# Patient Record
Sex: Male | Born: 1940 | Race: White | Hispanic: No | Smoking: Never smoker
Health system: Southern US, Community
[De-identification: ages and names within clinical notes are randomized; demographics above are authoritative.]

## PROBLEM LIST (undated history)

## (undated) DIAGNOSIS — E119 Type 2 diabetes mellitus without complications: Secondary | ICD-10-CM

## (undated) DIAGNOSIS — I10 Essential (primary) hypertension: Secondary | ICD-10-CM

## (undated) DIAGNOSIS — I429 Cardiomyopathy, unspecified: Secondary | ICD-10-CM

## (undated) DIAGNOSIS — E785 Hyperlipidemia, unspecified: Secondary | ICD-10-CM

## (undated) DIAGNOSIS — M109 Gout, unspecified: Secondary | ICD-10-CM

## (undated) DIAGNOSIS — F319 Bipolar disorder, unspecified: Secondary | ICD-10-CM

## (undated) DIAGNOSIS — N400 Enlarged prostate without lower urinary tract symptoms: Secondary | ICD-10-CM

## (undated) DIAGNOSIS — Z950 Presence of cardiac pacemaker: Secondary | ICD-10-CM

---

## 2017-02-28 ENCOUNTER — Inpatient Hospital Stay
Admission: EM | Admit: 2017-02-28 | Discharge: 2017-03-17 | DRG: 853 | Disposition: A | Discharge status: Skilled Nursing Facility | Payer: Medicare Other | Attending: Internal Medicine | Admitting: Internal Medicine

## 2017-02-28 ENCOUNTER — Encounter: Payer: Self-pay | Admitting: Emergency Medicine

## 2017-02-28 ENCOUNTER — Emergency Department: Payer: Medicare Other

## 2017-02-28 DIAGNOSIS — Z950 Presence of cardiac pacemaker: Secondary | ICD-10-CM

## 2017-02-28 DIAGNOSIS — E8809 Other disorders of plasma-protein metabolism, not elsewhere classified: Secondary | ICD-10-CM | POA: Diagnosis present

## 2017-02-28 DIAGNOSIS — N179 Acute kidney failure, unspecified: Secondary | ICD-10-CM | POA: Diagnosis present

## 2017-02-28 DIAGNOSIS — E870 Hyperosmolality and hypernatremia: Secondary | ICD-10-CM | POA: Diagnosis present

## 2017-02-28 DIAGNOSIS — R6521 Severe sepsis with septic shock: Secondary | ICD-10-CM | POA: Diagnosis not present

## 2017-02-28 DIAGNOSIS — Z79899 Other long term (current) drug therapy: Secondary | ICD-10-CM | POA: Diagnosis not present

## 2017-02-28 DIAGNOSIS — Z0189 Encounter for other specified special examinations: Secondary | ICD-10-CM

## 2017-02-28 DIAGNOSIS — I110 Hypertensive heart disease with heart failure: Secondary | ICD-10-CM | POA: Diagnosis present

## 2017-02-28 DIAGNOSIS — F319 Bipolar disorder, unspecified: Secondary | ICD-10-CM | POA: Diagnosis present

## 2017-02-28 DIAGNOSIS — R109 Unspecified abdominal pain: Secondary | ICD-10-CM

## 2017-02-28 DIAGNOSIS — E871 Hypo-osmolality and hyponatremia: Secondary | ICD-10-CM | POA: Diagnosis present

## 2017-02-28 DIAGNOSIS — J189 Pneumonia, unspecified organism: Secondary | ICD-10-CM

## 2017-02-28 DIAGNOSIS — J690 Pneumonitis due to inhalation of food and vomit: Secondary | ICD-10-CM | POA: Diagnosis present

## 2017-02-28 DIAGNOSIS — E785 Hyperlipidemia, unspecified: Secondary | ICD-10-CM | POA: Diagnosis present

## 2017-02-28 DIAGNOSIS — Z66 Do not resuscitate: Secondary | ICD-10-CM | POA: Diagnosis present

## 2017-02-28 DIAGNOSIS — K567 Ileus, unspecified: Secondary | ICD-10-CM | POA: Diagnosis present

## 2017-02-28 DIAGNOSIS — I248 Other forms of acute ischemic heart disease: Secondary | ICD-10-CM | POA: Diagnosis present

## 2017-02-28 DIAGNOSIS — I429 Cardiomyopathy, unspecified: Secondary | ICD-10-CM | POA: Diagnosis present

## 2017-02-28 DIAGNOSIS — A419 Sepsis, unspecified organism: Principal | ICD-10-CM | POA: Diagnosis present

## 2017-02-28 DIAGNOSIS — Z9911 Dependence on respirator [ventilator] status: Secondary | ICD-10-CM

## 2017-02-28 DIAGNOSIS — I252 Old myocardial infarction: Secondary | ICD-10-CM

## 2017-02-28 DIAGNOSIS — N5089 Other specified disorders of the male genital organs: Secondary | ICD-10-CM | POA: Diagnosis present

## 2017-02-28 DIAGNOSIS — R410 Disorientation, unspecified: Secondary | ICD-10-CM

## 2017-02-28 DIAGNOSIS — R0600 Dyspnea, unspecified: Secondary | ICD-10-CM

## 2017-02-28 DIAGNOSIS — I5042 Chronic combined systolic (congestive) and diastolic (congestive) heart failure: Secondary | ICD-10-CM | POA: Diagnosis present

## 2017-02-28 DIAGNOSIS — I420 Dilated cardiomyopathy: Secondary | ICD-10-CM | POA: Diagnosis not present

## 2017-02-28 DIAGNOSIS — E872 Acidosis, unspecified: Secondary | ICD-10-CM

## 2017-02-28 DIAGNOSIS — K55069 Acute infarction of intestine, part and extent unspecified: Secondary | ICD-10-CM | POA: Diagnosis present

## 2017-02-28 DIAGNOSIS — R140 Abdominal distension (gaseous): Secondary | ICD-10-CM | POA: Diagnosis not present

## 2017-02-28 DIAGNOSIS — E876 Hypokalemia: Secondary | ICD-10-CM | POA: Diagnosis present

## 2017-02-28 DIAGNOSIS — R748 Abnormal levels of other serum enzymes: Secondary | ICD-10-CM | POA: Diagnosis not present

## 2017-02-28 DIAGNOSIS — R404 Transient alteration of awareness: Secondary | ICD-10-CM

## 2017-02-28 DIAGNOSIS — Z4659 Encounter for fitting and adjustment of other gastrointestinal appliance and device: Secondary | ICD-10-CM

## 2017-02-28 DIAGNOSIS — J181 Lobar pneumonia, unspecified organism: Secondary | ICD-10-CM

## 2017-02-28 DIAGNOSIS — F05 Delirium due to known physiological condition: Secondary | ICD-10-CM | POA: Diagnosis not present

## 2017-02-28 DIAGNOSIS — E874 Mixed disorder of acid-base balance: Secondary | ICD-10-CM | POA: Diagnosis present

## 2017-02-28 DIAGNOSIS — R188 Other ascites: Secondary | ICD-10-CM | POA: Diagnosis present

## 2017-02-28 DIAGNOSIS — I2510 Atherosclerotic heart disease of native coronary artery without angina pectoris: Secondary | ICD-10-CM | POA: Diagnosis present

## 2017-02-28 DIAGNOSIS — Z452 Encounter for adjustment and management of vascular access device: Secondary | ICD-10-CM

## 2017-02-28 DIAGNOSIS — J9601 Acute respiratory failure with hypoxia: Secondary | ICD-10-CM | POA: Diagnosis present

## 2017-02-28 DIAGNOSIS — Z978 Presence of other specified devices: Secondary | ICD-10-CM

## 2017-02-28 DIAGNOSIS — M109 Gout, unspecified: Secondary | ICD-10-CM | POA: Diagnosis present

## 2017-02-28 DIAGNOSIS — T85598A Other mechanical complication of other gastrointestinal prosthetic devices, implants and grafts, initial encounter: Secondary | ICD-10-CM

## 2017-02-28 DIAGNOSIS — J9600 Acute respiratory failure, unspecified whether with hypoxia or hypercapnia: Secondary | ICD-10-CM

## 2017-02-28 HISTORY — DX: Benign prostatic hyperplasia without lower urinary tract symptoms: N40.0

## 2017-02-28 HISTORY — DX: Hyperlipidemia, unspecified: E78.5

## 2017-02-28 HISTORY — DX: Presence of cardiac pacemaker: Z95.0

## 2017-02-28 HISTORY — DX: Bipolar disorder, unspecified: F31.9

## 2017-02-28 HISTORY — DX: Cardiomyopathy, unspecified: I42.9

## 2017-02-28 HISTORY — DX: Gout, unspecified: M10.9

## 2017-02-28 HISTORY — DX: Essential (primary) hypertension: I10

## 2017-02-28 LAB — COMPREHENSIVE METABOLIC PANEL
ALT: 34 U/L (ref 17–63)
AST: 65 U/L — AB (ref 15–41)
Albumin: 3 g/dL — ABNORMAL LOW (ref 3.5–5.0)
Alkaline Phosphatase: 65 U/L (ref 38–126)
Anion gap: 20 — ABNORMAL HIGH (ref 5–15)
BUN: 65 mg/dL — AB (ref 6–20)
CO2: 18 mmol/L — AB (ref 22–32)
Calcium: 8.8 mg/dL — ABNORMAL LOW (ref 8.9–10.3)
Chloride: 93 mmol/L — ABNORMAL LOW (ref 101–111)
Creatinine, Ser: 4.75 mg/dL — ABNORMAL HIGH (ref 0.61–1.24)
GFR, EST AFRICAN AMERICAN: 13 mL/min — AB (ref >60)
GFR, EST NON AFRICAN AMERICAN: 11 mL/min — AB (ref >60)
Glucose, Bld: 165 mg/dL — ABNORMAL HIGH (ref 65–99)
Potassium: 5.1 mmol/L (ref 3.5–5.1)
SODIUM: 131 mmol/L — AB (ref 135–145)
Total Bilirubin: 0.9 mg/dL (ref 0.3–1.2)
Total Protein: 6.3 g/dL — ABNORMAL LOW (ref 6.5–8.1)

## 2017-02-28 LAB — URINALYSIS, COMPLETE (UACMP) WITH MICROSCOPIC
Bacteria, UA: NONE SEEN
Bilirubin Urine: NEGATIVE
Glucose, UA: 50 mg/dL — AB
KETONES UR: 5 mg/dL — AB
Leukocytes, UA: NEGATIVE
Nitrite: NEGATIVE
PH: 5 (ref 5.0–8.0)
Protein, ur: 30 mg/dL — AB
SPECIFIC GRAVITY, URINE: 1.025 (ref 1.005–1.030)
SQUAMOUS EPITHELIAL / LPF: NONE SEEN

## 2017-02-28 LAB — BLOOD GAS, VENOUS
ACID-BASE DEFICIT: 7.1 mmol/L — AB (ref 0.0–2.0)
BICARBONATE: 16.9 mmol/L — AB (ref 20.0–28.0)
O2 SAT: 81.6 %
PCO2 VEN: 30 mmHg — AB (ref 44.0–60.0)
PO2 VEN: 48 mmHg — AB (ref 32.0–45.0)
Patient temperature: 37
pH, Ven: 7.36 (ref 7.250–7.430)

## 2017-02-28 LAB — BRAIN NATRIURETIC PEPTIDE: B Natriuretic Peptide: 101 pg/mL — ABNORMAL HIGH (ref 0.0–100.0)

## 2017-02-28 LAB — CBC WITH DIFFERENTIAL/PLATELET
BASOS ABS: 0 10*3/uL (ref 0–0.1)
Basophils Relative: 0 %
Eosinophils Absolute: 0 10*3/uL (ref 0–0.7)
Eosinophils Relative: 0 %
HCT: 45.1 % (ref 40.0–52.0)
Hemoglobin: 15.2 g/dL (ref 13.0–18.0)
LYMPHS ABS: 1.2 10*3/uL (ref 1.0–3.6)
Lymphocytes Relative: 5 %
MCH: 32.9 pg (ref 26.0–34.0)
MCHC: 33.7 g/dL (ref 32.0–36.0)
MCV: 97.8 fL (ref 80.0–100.0)
MONO ABS: 3.3 10*3/uL — AB (ref 0.2–1.0)
Monocytes Relative: 14 %
NEUTROS PCT: 81 %
Neutro Abs: 19.2 10*3/uL — ABNORMAL HIGH (ref 1.4–6.5)
PLATELETS: 168 10*3/uL (ref 150–440)
RBC: 4.61 MIL/uL (ref 4.40–5.90)
RDW: 13.8 % (ref 11.5–14.5)
WBC: 23.7 10*3/uL — AB (ref 3.8–10.6)

## 2017-02-28 LAB — TROPONIN I: TROPONIN I: 0.08 ng/mL — AB (ref <0.03)

## 2017-02-28 LAB — PROCALCITONIN: PROCALCITONIN: 2.88 ng/mL

## 2017-02-28 LAB — GLUCOSE, CAPILLARY: Glucose-Capillary: 165 mg/dL — ABNORMAL HIGH (ref 65–99)

## 2017-02-28 LAB — PROTIME-INR
INR: 1.33
PROTHROMBIN TIME: 16.6 s — AB (ref 11.4–15.2)

## 2017-02-28 LAB — LIPASE, BLOOD: LIPASE: 159 U/L — AB (ref 11–51)

## 2017-02-28 LAB — LACTIC ACID, PLASMA: Lactic Acid, Venous: 7.3 mmol/L (ref 0.5–1.9)

## 2017-02-28 MED ORDER — SODIUM CHLORIDE 0.9 % IV BOLUS (SEPSIS)
500.0000 mL | INTRAVENOUS | Status: AC
  Administered 2017-03-01: 1000 mL via INTRAVENOUS

## 2017-02-28 MED ORDER — SODIUM CHLORIDE 0.9 % IV BOLUS (SEPSIS)
1000.0000 mL | Freq: Once | INTRAVENOUS | Status: AC
  Administered 2017-02-28: 1000 mL via INTRAVENOUS

## 2017-02-28 MED ORDER — DEXTROSE 5 % IV SOLN
2.0000 g | Freq: Once | INTRAVENOUS | Status: AC
  Administered 2017-02-28: 2 g via INTRAVENOUS
  Filled 2017-02-28: qty 2

## 2017-02-28 MED ORDER — SODIUM CHLORIDE 0.9 % IV BOLUS (SEPSIS): 1000.0000 mL | Freq: Once | INTRAVENOUS | Status: DC

## 2017-02-28 MED ORDER — VANCOMYCIN HCL IN DEXTROSE 1-5 GM/200ML-% IV SOLN
1000.0000 mg | Freq: Once | INTRAVENOUS | Status: AC
  Administered 2017-02-28: 1000 mg via INTRAVENOUS
  Filled 2017-02-28: qty 200

## 2017-02-28 MED ORDER — SODIUM CHLORIDE 0.9 % IV BOLUS (SEPSIS)
500.0000 mL | INTRAVENOUS | Status: AC
Start: 2017-02-28 — End: 2017-02-28
  Administered 2017-02-28: 500 mL via INTRAVENOUS

## 2017-02-28 NOTE — ED Notes (Signed)
7.3 lactic acid per lab, MD York CeriseForbach made aware at this time

## 2017-02-28 NOTE — ED Notes (Signed)
Pt placed on 4L [place] , sats at 93%

## 2017-02-28 NOTE — H&P (Addendum)
History and Physical   [facility] [place] [facility] Admission History and Physical [employer] Blessyn [place], D.O.    Patient Name: Alvin PeckJohn [name] MR#: [id number] Date of Birth: 05/19/1941 Date of Admission: 02/28/2017  Referring MD/NP/PA: Dr. York CeriseForbach Primary Care Physician: System, Pcp Not In  Chief Complaint:  Chief Complaint  Patient presents with  . Shortness of Breath    HPI: Alvin [name] is a 76 y.o. male [facility] with a known history of Bipolar disorder, hypertension presents to the emergency department for evaluation of respiratory distress.  Patient was in a usual state of health until 3-4 days ago when he describes the onset of abdominal pain, midepigastric and right lower quadrant associated with nausea, vomiting and diarrhea. He developed gradually worsening weakness secondary decreased by mouth intake. Today he became confused and acutely short of breath.  Patient denies fevers/chills, weakness, dizziness, chest pain,  dysuria/frequency, changes in mental status. Last bowel movement was yesterday.   Patient lives in an assisted living facility and is independent with his activities of daily living. Otherwise there has been no change in status. Patient has been taking medication as prescribed and there has been no recent change in medication or diet.  No recent antibiotics.  There has been no recent illness, hospitalizations, travel or sick contacts.    EMS/ED Course: Patient received normal saline boluses, Maxipime and vancomycin with some improvement in his mental status. Initial O2 sat was 85% on room air which increased to 93% on 4 L. We did attempt BiPAP however nausea and vomiting prohibited its use. Medical admission has been requested for further management of severe sepsis with septic shock..  Review of Systems:  CONSTITUTIONAL: Positive weakness. No fever/chills, fatigue weight gain/loss, headache. EYES: No blurry or double vision. ENT: No tinnitus, postnasal drip,  redness or soreness of the oropharynx. RESPIRATORY: No cough, dyspnea, wheeze.  No hemoptysis.  CARDIOVASCULAR: No chest pain, palpitations, syncope, orthopnea. No lower extremity edema.  GASTROINTESTINAL: Positive nausea, vomiting, abdominal pain, diarrhea. No hematemesis, melena or hematochezia. GENITOURINARY: No dysuria, frequency, hematuria. ENDOCRINE: No polyuria or nocturia. No heat or cold intolerance. HEMATOLOGY: No anemia, bruising, bleeding. INTEGUMENTARY: No rashes, ulcers, lesions. MUSCULOSKELETAL: No arthritis, gout, dyspnea. NEUROLOGIC: No numbness, tingling, ataxia, seizure-type activity, weakness. PSYCHIATRIC: No anxiety, depression, insomnia.   Past medical history:  Bipolar disorder and hypertension  No past surgical history on file.   has no tobacco, alcohol, and drug history on file.  No Known Allergies  No family history on file.  Prior to Admission medications   Medication Sig Start Date End Date Taking? Authorizing Provider  allopurinol (ZYLOPRIM) 300 MG tablet Take 300 mg by mouth daily.   Yes [provider]  atorvastatin (LIPITOR) 20 MG tablet Take 20 mg by mouth daily.   Yes [provider]  cholecalciferol (VITAMIN D) 1000 units tablet Take 1,000 Units by mouth daily.   Yes [provider]  divalproex (DEPAKOTE ER) 250 MG 24 hr tablet Take 250 mg by mouth daily.   Yes [provider]  divalproex (DEPAKOTE ER) 500 MG 24 hr tablet Take 500 mg by mouth daily.   Yes [provider]  lanolin/mineral oil (KERI/THERA-DERM) LOTN Apply 1 application topically as needed for dry skin.   Yes [provider]  lisinopril (PRINIVIL,ZESTRIL) 20 MG tablet Take 20 mg by mouth daily.   Yes [provider]  MELATONIN PO Take 3 mg by mouth at bedtime.   Yes [provider]  menthol-cetylpyridinium (CEPACOL)  3 MG lozenge Take 1 lozenge by mouth as needed for sore throat.   Yes [provider]   Multiple Vitamins-Minerals (MULTIVITAMINS THER. W/MINERALS) TABS tablet Take 1 tablet by mouth daily.   Yes [provider]  OLANZapine (ZYPREXA) 15 MG tablet Take 15 mg by mouth at bedtime.   Yes [provider]  OLANZapine (ZYPREXA) 2.5 MG tablet Take 2.5 mg by mouth every 8 (eight) hours as needed.   Yes [provider]  tamsulosin (FLOMAX) 0.4 MG CAPS capsule Take 0.4 mg by mouth daily.   Yes [provider]  thiamine (VITAMIN B-1) 100 MG tablet Take 100 mg by mouth daily.   Yes [provider]  traZODone (DESYREL) 50 MG tablet Take 50 mg by mouth at bedtime.   Yes [provider]  vitamin B-12 (CYANOCOBALAMIN) 1000 MCG tablet Take 1,000 mcg by mouth daily.   Yes [provider]    Physical Exam: Vitals:   02/28/17 2230 02/28/17 2233 02/28/17 2238 02/28/17 2242  BP:  (!) 77/45 (!) 79/59 (!) 81/53  Pulse: (!) 110 (!) 116 (!) 116 (!) 110  Resp: (!) 30 (!) 29 (!) 33 (!) 29  Temp:      TempSrc:      SpO2: 91% 91% (!) 88% (!) 89%  Weight:        GENERAL: 76 y.o.-year-old White male patient, ill-appearing sitting up in the bed in no acute distress. Answers questions, speaking in full sentences. HEENT: Head atraumatic, normocephalic. Pupils equal. Mucus membranes moist. NECK: Supple, full range of motion. No JVD, no bruit heard. No thyroid enlargement, no tenderness, no cervical lymphadenopathy. CHEST: Diffuse rhonchi, worse at the bases. No use of accessory muscles of respiration.  No reproducible chest wall tenderness.  CARDIOVASCULAR: S1, S2 normal. No murmurs, rubs, or gallops. Cap refill <2 seconds. Pulses intact distally.  ABDOMEN: Distended, tympanic, high-pitched bowel sounds. Mild tenderness palpation at the midepigastrium and right lower quadrant. No rebound, guarding, rigidity. EXTREMITIES: No pedal edema, cyanosis, or clubbing. No calf tenderness or Homan's sign.  NEUROLOGIC: The patient is alert and oriented x 3.  Cranial nerves II through XII are grossly intact with no focal sensorimotor deficit. PSYCHIATRIC:  Normal affect, mood, thought content. SKIN: Warm, dry, and intact without obvious rash, lesion, or ulcer.    Labs on Admission:  CBC:  Recent Labs Lab 02/28/17 2158  WBC 23.7*  NEUTROABS PENDING  HGB 15.2  HCT 45.1  MCV 97.8  PLT 168   Basic Metabolic Panel:  Recent Labs Lab 02/28/17 2158  NA 131*  K 5.1  CL 93*  CO2 18*  GLUCOSE 165*  BUN 65*  CREATININE 4.75*  CALCIUM 8.8*   GFR: CrCl cannot be calculated (Unknown ideal weight.). Liver Function Tests:  Recent Labs Lab 02/28/17 2158  AST 65*  ALT 34  ALKPHOS 65  BILITOT 0.9  PROT 6.3*  ALBUMIN 3.0*    Recent Labs Lab 02/28/17 2158  LIPASE 159*   No results for input(s): AMMONIA in the last 168 hours. Coagulation Profile:  Recent Labs Lab 02/28/17 2158  INR 1.33   Cardiac Enzymes:  Recent Labs Lab 02/28/17 2158  TROPONINI 0.08*   BNP (last 3 results) No results for input(s): PROBNP in the last 8760 hours. HbA1C: No results for input(s): HGBA1C in the last 72 hours. CBG:  Recent Labs Lab 02/28/17 2201  GLUCAP 165*   Lipid Profile: No results for input(s): CHOL, HDL, LDLCALC, TRIG, CHOLHDL, LDLDIRECT in the last  72 hours. Thyroid Function Tests: No results for input(s): TSH, T4TOTAL, FREET4, T3FREE, THYROIDAB in the last 72 hours. Anemia Panel: No results for input(s): VITAMINB12, FOLATE, FERRITIN, TIBC, IRON, RETICCTPCT in the last 72 hours. Urine analysis:    Component Value Date/Time   COLORURINE AMBER (A) 02/28/2017 2158   APPEARANCEUR HAZY (A) 02/28/2017 2158   LABSPEC 1.025 02/28/2017 2158   PHURINE 5.0 02/28/2017 2158   GLUCOSEU 50 (A) 02/28/2017 2158   HGBUR SMALL (A) 02/28/2017 2158   BILIRUBINUR NEGATIVE 02/28/2017 2158   KETONESUR 5 (A) 02/28/2017 2158   PROTEINUR 30 (A) 02/28/2017 2158   NITRITE NEGATIVE 02/28/2017 2158   LEUKOCYTESUR NEGATIVE 02/28/2017 2158    Sepsis Labs: @LABRCNTIP (procalcitonin:4,lacticidven:4) )No results found for this or any previous visit (from the past 240 hour(s)).   Radiological Exams on Admission: Dg Chest Port 1 View  Result Date: 02/28/2017 CLINICAL DATA:  Vomiting for several days EXAM: PORTABLE CHEST 1 VIEW COMPARISON:  None. FINDINGS: Cardiac shadow is mildly enlarged. Pacing device is noted. Mild left basilar opacity and small effusion are seen. The right lung is clear. The bony structures are within normal limits. IMPRESSION: Left basilar opacity with associated small effusion. Electronically Signed   By: Alcide Clever M.D.   On: 02/28/2017 22:25    EKG: Atrial paced at 110 bpm with normal axis and nonspecific ST-T wave changes.   Assessment/Plan  This is a 76 y.o. male with a history of bipolar disorder, hypertension now being admitted with:  #. Severe sepsis with septic shock secondary to CAP, possibly aspiration pneumonia - Admit to inpatient, ICU with telemetry monitoring - IV antibiotics: Maxipime, Vanco - IV fluid hydration - Trend lactate - Pressor support if needed - Follow up blood,urine & sputum cultures - Repeat CBC in am.   #. Acute respiratory failure with hypoxia - O2, nebs, expectorants  #. Abdominal distention rule out bowel obstruction - NPO -Stat CT abdomen and pelvis without contrast due to elevated creatinine. - NG tube placed in the emergency department draining 300-400 cc of bilious material - Consider surgical consultation  #. Renal insufficiency, unclear if acute or chronic - IV fluids and repeat BMP in AM.  - Avoid nephrotoxic medications  #. Elevated troponin, likely secondary to demand ischemia - Trend troponins - Monitor on telemetry  #. Elevated lipase - Follow up abdominal CT Continue Lipitor  #. History of BPH - Continue Flomax  #. History of Bipolar disorder - Continue Depakote, Zyprexa, Trazodone  Admission status: Inpatient, ICU IV Fluids:  NS Diet/Nutrition: NPO Consults called: CCM  DVT Px: Lovenox, SCDs and early ambulation. Code Status: Full Code  Disposition Plan: To be determined.  [facility] patient will need to be transferred when stable.  please see Dr. Scharlene Corn note.   All the records are reviewed and case discussed with ED provider. Management plans discussed with the patient and/or family who express understanding and agree with plan of care.  Tia Gelb D.O. on 02/28/2017 at 11:18 PM Between 7am to 6pm - Pager - [phone] After 6pm go to [email or web address] - password [email or web address] [place] [facility] Office [phone] CC: Primary care physician; System, Pcp Not In   02/28/2017, 11:18 PM

## 2017-02-28 NOTE — ED Notes (Signed)
Per EDP York CeriseForbach give 1000 ML of fluid due to BP

## 2017-02-28 NOTE — ED Provider Notes (Signed)
[facility] Emergency Department Provider Note  ____________________________________________   First MD Initiated Contact with Patient 02/28/17 2200     (approximate)  I have reviewed the triage vital signs and the nursing notes.   HISTORY  Chief Complaint Shortness of Breath  Level 5 caveat:  history/ROS limited by acute/critical illness  HPI Alvin [name] is a 76 y.o. male who presents by EMS for several days of vomiting and now with shortness of breath.  EMS reports that he had an oxygen level of about 85% on room air with increased work of breathing and coarse breath sounds.  He is on oxygen upon arrival at 4 L she has had about 93%.  He is awake and alert but not able to provide much history and does appear acutely ill.  His daughter came in afterwards and was able to provide more history.  She confirms that he lives in a facility but requires very little assistance with his activities of daily living. He started vomiting several days ago and has continued to do so and she has seen him gradually decline in his overall status over the last few days.  As of today he is acutely ill, less responsive, and obviously having trouble breathing.    He denies any chest pain, admits to shortness of breath, and denies abdominal pain.  He also admits to the vomiting and diarrhea.  He denies dysuria.  The symptoms began gradually getting worse over the last several days, are now severe, and nothing makes them better or worse.  He typically goes to the [place] and has full benefits.  Past Medical History:  Diagnosis Date  . Bipolar disorder (HCC)   . Hypertension     Patient Active Problem List   Diagnosis Date Noted  . Severe sepsis with septic shock (HCC) 02/28/2017    History reviewed. No pertinent surgical history.  Prior to Admission medications   Medication Sig Start Date End Date Taking? Authorizing Provider  allopurinol (ZYLOPRIM) 300 MG tablet Take 300 mg by  mouth daily.   Yes [provider]  atorvastatin (LIPITOR) 20 MG tablet Take 20 mg by mouth daily.   Yes [provider]  cholecalciferol (VITAMIN D) 1000 units tablet Take 1,000 Units by mouth daily.   Yes [provider]  divalproex (DEPAKOTE ER) 250 MG 24 hr tablet Take 250 mg by mouth daily.   Yes [provider]  divalproex (DEPAKOTE ER) 500 MG 24 hr tablet Take 500 mg by mouth daily.   Yes [provider]  lanolin/mineral oil (KERI/THERA-DERM) LOTN Apply 1 application topically as needed for dry skin.   Yes [provider]  lisinopril (PRINIVIL,ZESTRIL) 20 MG tablet Take 20 mg by mouth daily.   Yes [provider]  MELATONIN PO Take 3 mg by mouth at bedtime.   Yes [provider]  menthol-cetylpyridinium (CEPACOL) 3 MG lozenge Take 1 lozenge by mouth as needed for sore throat.   Yes [provider]  Multiple Vitamins-Minerals (MULTIVITAMINS THER. W/MINERALS) TABS tablet Take 1 tablet by mouth daily.   Yes [provider]  OLANZapine (ZYPREXA) 15 MG tablet Take 15 mg by mouth at bedtime.   Yes [provider]  OLANZapine (ZYPREXA) 2.5 MG tablet Take 2.5 mg by mouth every 8 (eight) hours as needed.   Yes [provider]  tamsulosin (FLOMAX) 0.4 MG CAPS capsule Take 0.4 mg by mouth daily.   Yes [provider]  thiamine (VITAMIN B-1) 100  MG tablet Take 100 mg by mouth daily.   Yes [provider]  traZODone (DESYREL) 50 MG tablet Take 50 mg by mouth at bedtime.   Yes [provider]  vitamin B-12 (CYANOCOBALAMIN) 1000 MCG tablet Take 1,000 mcg by mouth daily.   Yes [provider]    Allergies Patient has no known allergies.  History reviewed. No pertinent family history.  Social History Social History  Substance Use Topics  . Smoking status: Never Smoker  . Smokeless tobacco: Never Used  . Alcohol use No    Review of Systems  Level 5  caveat:  history/ROS limited by acute/critical illness  ____________________________________________   PHYSICAL EXAM:  VITAL SIGNS: ED Triage Vitals  Enc Vitals Group     BP 02/28/17 2233 (!) 77/45     Pulse Rate 02/28/17 2158 96     Resp 02/28/17 2201 (!) 33     Temp 02/28/17 2204 98 F (36.7 C)     Temp Source 02/28/17 2204 Oral     SpO2 02/28/17 2158 (!) 87 %     Weight 02/28/17 2203 86.2 kg (190 lb)     Height --      Head Circumference --      Peak Flow --      Pain Score --      Pain Loc --      Pain Edu? --      Excl. in GC? --     Constitutional: Ill-appearing but alert and responsive to questions although he is clearly suffering from an element of delirium/confusion Eyes: Conjunctivae are normal.  Head: Atraumatic. Nose: No congestion/rhinnorhea. Mouth/Throat: Mucous membranes are dry Neck: No stridor.  No meningeal signs.   Cardiovascular: Borderline tachycardia, regular rhythm. Good peripheral circulation. Grossly normal heart sounds. Respiratory: Increased respiratory effort.  No retractions. Coarse breath sounds throughout, worse in bases. Gastrointestinal: Soft and nontender. Distended but apparently at baseline. Musculoskeletal: No lower extremity tenderness nor edema. No gross deformities of extremities. Neurologic:  Moving all four extremities Skin:  Skin is pale, warm, dry and intact. No rash noted.  ____________________________________________   LABS (all labs ordered are listed, but only abnormal results are displayed)  Labs Reviewed  LACTIC ACID, PLASMA - Abnormal; Notable for the following:       Result Value   Lactic Acid, Venous 7.3 (*)    All other components within normal limits  COMPREHENSIVE METABOLIC PANEL - Abnormal; Notable for the following:    Sodium 131 (*)    Chloride 93 (*)    CO2 18 (*)    Glucose, Bld 165 (*)    BUN 65 (*)    Creatinine, Ser 4.75 (*)    Calcium 8.8 (*)    Total Protein 6.3 (*)    Albumin 3.0 (*)    AST  65 (*)    GFR calc non Af Amer 11 (*)    GFR calc Af Amer 13 (*)    Anion gap 20 (*)    All other components within normal limits  LIPASE, BLOOD - Abnormal; Notable for the following:    Lipase 159 (*)    All other components within normal limits  BRAIN NATRIURETIC PEPTIDE - Abnormal; Notable for the following:    B Natriuretic Peptide 101.0 (*)    All other components within normal limits  TROPONIN I - Abnormal; Notable for the following:    Troponin I 0.08 (*)    All other components within normal limits  CBC WITH DIFFERENTIAL/PLATELET - Abnormal; Notable for the following:    WBC 23.7 (*)    Neutro Abs 19.2 (*)    Monocytes Absolute 3.3 (*)    All other components within normal limits  PROTIME-INR - Abnormal; Notable for the following:    Prothrombin Time 16.6 (*)    All other components within normal limits  URINALYSIS, COMPLETE (UACMP) WITH MICROSCOPIC - Abnormal; Notable for the following:    Color, Urine AMBER (*)    APPearance HAZY (*)    Glucose, UA 50 (*)    Hgb urine dipstick SMALL (*)    Ketones, ur 5 (*)    Protein, ur 30 (*)    All other components within normal limits  BLOOD GAS, VENOUS - Abnormal; Notable for the following:    pCO2, Ven 30 (*)    pO2, Ven 48.0 (*)    Bicarbonate 16.9 (*)    Acid-base deficit 7.1 (*)    All other components within normal limits  GLUCOSE, CAPILLARY - Abnormal; Notable for the following:    Glucose-Capillary 165 (*)    All other components within normal limits  CULTURE, BLOOD (ROUTINE X 2)  CULTURE, BLOOD (ROUTINE X 2)  URINE CULTURE  PROCALCITONIN  LACTIC ACID, PLASMA   ____________________________________________  EKG  ED ECG REPORT I, Bracen Schum, the attending physician, personally viewed and interpreted this ECG.  Date: 02/28/2017 EKG Time: 22:19 Rate: 110 Rhythm: atrial-sensed ventricular paced rhythm with tachycardia QRS Axis: paced Intervals: paced ST/T Wave abnormalities: Non-specific ST segment /  T-wave changes, but no evidence of acute ischemia.   ____________________________________________  RADIOLOGY   Dg Chest Port 1 View  Result Date: 02/28/2017 CLINICAL DATA:  Vomiting for several days EXAM: PORTABLE CHEST 1 VIEW COMPARISON:  None. FINDINGS: Cardiac shadow is mildly enlarged. Pacing device is noted. Mild left basilar opacity and small effusion are seen. The right lung is clear. The bony structures are within normal limits. IMPRESSION: Left basilar opacity with associated small effusion. Electronically Signed   By: Alcide Clever M.D.   On: 02/28/2017 22:25    ____________________________________________   PROCEDURES  Critical Care performed: Yes, see critical care procedure note(s)   Procedure(s) performed:   .Critical Care Performed by: Loleta Rose Authorized by: Loleta Rose   Critical care provider statement:    Critical care time (minutes):  60   Critical care time was exclusive of:  Separately billable procedures and treating other patients   Critical care was necessary to treat or prevent imminent or life-threatening deterioration of the following conditions:  Sepsis and shock   Critical care was time spent personally by me on the following activities:  Development of treatment plan with patient or surrogate, discussions with consultants, evaluation of patient's response to treatment, examination of patient, obtaining history from patient or surrogate, ordering and performing treatments and interventions, ordering and review of laboratory studies, ordering and review of radiographic studies, pulse oximetry, re-evaluation of patient's condition and review of old charts      ____________________________________________   INITIAL IMPRESSION / ASSESSMENT AND PLAN / ED COURSE  Pertinent labs & imaging results that were available during my care of the patient were reviewed by me and considered in my medical decision making (see chart for  details).     Clinical Course as of Mar 02 3  Tue Feb 28, 2017  2203 Patient has tachypnea, hypoxemia, several days of vomiting, appears ill and probably septic.  Ordering septic protocol and empiric cefepime/vanc (concern  for possible nephrotoxicity if the patient has decreased renal function and I give Zosyn).  Will reassess shortly.  Does not need Bipap at this time.  [CF]  2256 Severely elevated lactic acid, acute renal failure, elevated troponin, elevated lipase.  Severe sepsis with shock and multi-system organ failure.  Unstable for transport to [facility], will submit paperwork to this effect.  Updated patient and daughter about how critically ill he is at this point.  Does not require intubation currently.  [CF]  2310 My secretary Delaney Meigs(Tamara) spoke with a representative at the [facility] (last name Christella HartiganJacobs).  Delaney Meigsamara explained the patient is too critically ill and unstable for transfer at this time.  Ms. Christella HartiganJacobs advised us that it is NOT necessary to complete and fax over paperwork at this time.  When the patient IS stable for transfer, the inpatient team will need to fill out the [facility] paperwork. Again, the patient is unstable at this time but this should not exclude him from the [facility] benefits to which he is entitled.  [CF]  2311 Communicated with Dr. Emmit PomfretHugelmeyer (hospitalist) who will come and admit the patient at the next available opportunity.  [CF]  2314 Blood pressure has improved to about 103/55.  [CF]  2354 Patient is still mouth breathing but he had been maintaining oxygenation in the low 90s.  Now he is consistently at about 88% with a good waveform.  I have called respiratory therapy and asked her to put him on BiPAP to maintain oxygenation and decrease his work of breathing.  We are still awaiting a bed in the ICU.  Continuing towards the goal of 30 mL/kg of normal saline.  [CF]    Clinical Course User Index [CF] Loleta RoseForbach, Elzabeth Mcquerry, MD    ____________________________________________  FINAL CLINICAL  IMPRESSION(S) / ED DIAGNOSES  Final diagnoses:  Severe sepsis with septic shock (HCC)  Lactic acidosis  Acute respiratory failure with hypoxemia (HCC)  Elevated lipase  Demand ischemia (HCC)  Community acquired pneumonia of left lower lobe of lung (HCC)     MEDICATIONS GIVEN DURING THIS VISIT:  Medications  sodium chloride 0.9 % bolus 1,000 mL (1,000 mLs Intravenous New Bag/Given 02/28/17 2354)    And  sodium chloride 0.9 % bolus 1,000 mL (0 mLs Intravenous Stopped 02/28/17 2331)  ceFEPIme (MAXIPIME) 2 g in dextrose 5 % 50 mL IVPB (0 g Intravenous Stopped 02/28/17 2243)  vancomycin (VANCOCIN) IVPB 1000 mg/200 mL premix (0 mg Intravenous Stopped 02/28/17 2316)  sodium chloride 0.9 % bolus 500 mL (0 mLs Intravenous Stopped 02/28/17 2300)  sodium chloride 0.9 % bolus 500 mL (1,000 mLs Intravenous New Bag/Given 03/01/17 0000)     NEW OUTPATIENT MEDICATIONS STARTED DURING THIS VISIT:  New Prescriptions   No medications on file    Modified Medications   No medications on file    Discontinued Medications   No medications on file     Note:  This document was prepared using Dragon voice recognition software and may include unintentional dictation errors.   Loleta RoseForbach, Damante Spragg, MD 03/01/17 0005

## 2017-02-28 NOTE — ED Notes (Signed)
0.08 troponin , MD York CeriseForbach made aware

## 2017-02-28 NOTE — ED Triage Notes (Signed)
Pt to ED via EMS from the [facility], states vomiting xfew days, now  Hypoxic and tachy. Code sepsis called. EDP at bedside

## 2017-03-01 ENCOUNTER — Inpatient Hospital Stay: Payer: Medicare Other | Admitting: Anesthesiology

## 2017-03-01 ENCOUNTER — Inpatient Hospital Stay: Payer: Medicare Other

## 2017-03-01 ENCOUNTER — Encounter
Admission: EM | Disposition: A | Discharge status: Skilled Nursing Facility | Payer: Self-pay | Source: Home / Self Care | Attending: Internal Medicine

## 2017-03-01 ENCOUNTER — Encounter: Payer: Self-pay | Admitting: Anesthesiology

## 2017-03-01 DIAGNOSIS — J9601 Acute respiratory failure with hypoxia: Secondary | ICD-10-CM

## 2017-03-01 DIAGNOSIS — R140 Abdominal distension (gaseous): Secondary | ICD-10-CM

## 2017-03-01 DIAGNOSIS — E872 Acidosis, unspecified: Secondary | ICD-10-CM

## 2017-03-01 DIAGNOSIS — R748 Abnormal levels of other serum enzymes: Secondary | ICD-10-CM

## 2017-03-01 HISTORY — PX: LAPAROTOMY: SHX154

## 2017-03-01 LAB — GLUCOSE, CAPILLARY
GLUCOSE-CAPILLARY: 160 mg/dL — AB (ref 65–99)
GLUCOSE-CAPILLARY: 137 mg/dL — AB (ref 65–99)
Glucose-Capillary: 151 mg/dL — ABNORMAL HIGH (ref 65–99)
Glucose-Capillary: 143 mg/dL — ABNORMAL HIGH (ref 65–99)

## 2017-03-01 LAB — BLOOD GAS, ARTERIAL
Acid-base deficit: 9.3 mmol/L — ABNORMAL HIGH (ref 0.0–2.0)
BICARBONATE: 20 mmol/L (ref 20.0–28.0)
FIO2: 0.9
Mechanical Rate: 15
O2 Saturation: 87.5 %
PEEP/CPAP: 8 cmH2O
PH ART: 7.16 — AB (ref 7.350–7.450)
Patient temperature: 37
RATE: 15 resp/min
VT: 500 mL
pCO2 arterial: 56 mmHg — ABNORMAL HIGH (ref 32.0–48.0)
pO2, Arterial: 69 mmHg — ABNORMAL LOW (ref 83.0–108.0)

## 2017-03-01 LAB — COMPREHENSIVE METABOLIC PANEL
ALBUMIN: 2.7 g/dL — AB (ref 3.5–5.0)
ALT: 33 U/L (ref 17–63)
ANION GAP: 13 (ref 5–15)
AST: 56 U/L — AB (ref 15–41)
Alkaline Phosphatase: 58 U/L (ref 38–126)
BILIRUBIN TOTAL: 0.9 mg/dL (ref 0.3–1.2)
BUN: 65 mg/dL — AB (ref 6–20)
CHLORIDE: 98 mmol/L — AB (ref 101–111)
CO2: 24 mmol/L (ref 22–32)
Calcium: 7.9 mg/dL — ABNORMAL LOW (ref 8.9–10.3)
Creatinine, Ser: 3.9 mg/dL — ABNORMAL HIGH (ref 0.61–1.24)
GFR calc Af Amer: 16 mL/min — ABNORMAL LOW (ref >60)
GFR calc non Af Amer: 14 mL/min — ABNORMAL LOW (ref >60)
GLUCOSE: 170 mg/dL — AB (ref 65–99)
POTASSIUM: 5 mmol/L (ref 3.5–5.1)
Sodium: 135 mmol/L (ref 135–145)
TOTAL PROTEIN: 5.8 g/dL — AB (ref 6.5–8.1)

## 2017-03-01 LAB — CBC WITH DIFFERENTIAL/PLATELET
BASOS PCT: 0 %
Basophils Absolute: 0 10*3/uL (ref 0–0.1)
EOS ABS: 0 10*3/uL (ref 0–0.7)
EOS PCT: 0 %
HCT: 44.5 % (ref 40.0–52.0)
HEMOGLOBIN: 14.8 g/dL (ref 13.0–18.0)
LYMPHS PCT: 8 %
Lymphs Abs: 1.3 10*3/uL (ref 1.0–3.6)
MCH: 33 pg (ref 26.0–34.0)
MCHC: 33.3 g/dL (ref 32.0–36.0)
MCV: 99.2 fL (ref 80.0–100.0)
MONO ABS: 2.3 10*3/uL — AB (ref 0.2–1.0)
Monocytes Relative: 14 %
NEUTROS PCT: 78 %
Neutro Abs: 12.8 10*3/uL — ABNORMAL HIGH (ref 1.4–6.5)
PLATELETS: 152 10*3/uL (ref 150–440)
RBC: 4.48 MIL/uL (ref 4.40–5.90)
RDW: 14.1 % (ref 11.5–14.5)
WBC: 16.4 10*3/uL — AB (ref 3.8–10.6)

## 2017-03-01 LAB — BASIC METABOLIC PANEL
ANION GAP: 9 (ref 5–15)
ANION GAP: 11 (ref 5–15)
BUN: 62 mg/dL — AB (ref 6–20)
BUN: 62 mg/dL — ABNORMAL HIGH (ref 6–20)
CHLORIDE: 101 mmol/L (ref 101–111)
CHLORIDE: 105 mmol/L (ref 101–111)
CO2: 24 mmol/L (ref 22–32)
CO2: 18 mmol/L — AB (ref 22–32)
Calcium: 7.5 mg/dL — ABNORMAL LOW (ref 8.9–10.3)
Calcium: 7.1 mg/dL — ABNORMAL LOW (ref 8.9–10.3)
Creatinine, Ser: 3.4 mg/dL — ABNORMAL HIGH (ref 0.61–1.24)
Creatinine, Ser: 3.28 mg/dL — ABNORMAL HIGH (ref 0.61–1.24)
GFR calc Af Amer: 20 mL/min — ABNORMAL LOW (ref >60)
GFR calc non Af Amer: 17 mL/min — ABNORMAL LOW (ref >60)
GFR, EST AFRICAN AMERICAN: 19 mL/min — AB (ref >60)
GFR, EST NON AFRICAN AMERICAN: 16 mL/min — AB (ref >60)
GLUCOSE: 189 mg/dL — AB (ref 65–99)
Glucose, Bld: 164 mg/dL — ABNORMAL HIGH (ref 65–99)
POTASSIUM: 4.7 mmol/L (ref 3.5–5.1)
POTASSIUM: 4.8 mmol/L (ref 3.5–5.1)
SODIUM: 134 mmol/L — AB (ref 135–145)
Sodium: 134 mmol/L — ABNORMAL LOW (ref 135–145)

## 2017-03-01 LAB — LACTIC ACID, PLASMA
LACTIC ACID, VENOUS: 4.6 mmol/L — AB (ref 0.5–1.9)
LACTIC ACID, VENOUS: 3.4 mmol/L — AB (ref 0.5–1.9)
Lactic Acid, Venous: 3.4 mmol/L (ref 0.5–1.9)

## 2017-03-01 LAB — TROPONIN I
TROPONIN I: 0.09 ng/mL — AB (ref <0.03)
TROPONIN I: 0.09 ng/mL — AB (ref <0.03)
Troponin I: 0.06 ng/mL (ref <0.03)

## 2017-03-01 LAB — PROTIME-INR
INR: 1.28
PROTHROMBIN TIME: 16.1 s — AB (ref 11.4–15.2)

## 2017-03-01 LAB — PROCALCITONIN: Procalcitonin: 3.58 ng/mL

## 2017-03-01 LAB — MAGNESIUM: MAGNESIUM: 1.9 mg/dL (ref 1.7–2.4)

## 2017-03-01 LAB — MRSA PCR SCREENING: MRSA BY PCR: NEGATIVE

## 2017-03-01 SURGERY — LAPAROTOMY, EXPLORATORY
Anesthesia: General | Site: Abdomen | Wound class: Clean Contaminated

## 2017-03-01 SURGERY — LAPAROTOMY, EXPLORATORY
Anesthesia: General | Site: Abdomen

## 2017-03-01 MED ORDER — SODIUM CHLORIDE 0.9 % IR SOLN
Status: DC | PRN
  Administered 2017-03-01: 1000 mL

## 2017-03-01 MED ORDER — VITAMIN B-12 1000 MCG PO TABS: 1000.0000 ug | ORAL_TABLET | Freq: Every day | ORAL | Status: DC

## 2017-03-01 MED ORDER — ONDANSETRON HCL 4 MG PO TABS: 4.0000 mg | ORAL_TABLET | Freq: Four times a day (QID) | ORAL | Status: DC | PRN

## 2017-03-01 MED ORDER — FENTANYL BOLUS VIA INFUSION
25.0000 ug | INTRAVENOUS | Status: DC | PRN
  Administered 2017-03-04 – 2017-03-05 (×5): 25 ug via INTRAVENOUS
  Filled 2017-03-01: qty 25

## 2017-03-01 MED ORDER — EPINEPHRINE PF 1 MG/10ML IJ SOSY
PREFILLED_SYRINGE | INTRAMUSCULAR | Status: DC | PRN
  Administered 2017-03-01 (×2): 20 ug via INTRAVENOUS

## 2017-03-01 MED ORDER — LISINOPRIL 20 MG PO TABS: 20.0000 mg | ORAL_TABLET | Freq: Every day | ORAL | Status: DC

## 2017-03-01 MED ORDER — DEXTROSE 5 % IV SOLN: 2.0000 g | INTRAVENOUS | Status: DC

## 2017-03-01 MED ORDER — ONDANSETRON HCL 4 MG/2ML IJ SOLN: 4.0000 mg | Freq: Four times a day (QID) | INTRAMUSCULAR | Status: DC | PRN

## 2017-03-01 MED ORDER — VASOPRESSIN 20 UNIT/ML IV SOLN
INTRAVENOUS | Status: AC
  Filled 2017-03-01: qty 1

## 2017-03-01 MED ORDER — VANCOMYCIN HCL IN DEXTROSE 1-5 GM/200ML-% IV SOLN
1000.0000 mg | INTRAVENOUS | Status: DC
  Filled 2017-03-01: qty 200

## 2017-03-01 MED ORDER — OXYCODONE HCL 5 MG PO TABS: 5.0000 mg | ORAL_TABLET | ORAL | Status: DC | PRN

## 2017-03-01 MED ORDER — ONDANSETRON HCL 4 MG/2ML IJ SOLN
4.0000 mg | Freq: Once | INTRAMUSCULAR | Status: AC
  Administered 2017-03-01: 4 mg via INTRAVENOUS

## 2017-03-01 MED ORDER — FENTANYL CITRATE (PF) 100 MCG/2ML IJ SOLN
INTRAMUSCULAR | Status: DC | PRN
  Administered 2017-03-01: 50 ug via INTRAVENOUS

## 2017-03-01 MED ORDER — TAMSULOSIN HCL 0.4 MG PO CAPS: 0.4000 mg | ORAL_CAPSULE | Freq: Every day | ORAL | Status: DC

## 2017-03-01 MED ORDER — ORAL CARE MOUTH RINSE: 15.0000 mL | Freq: Two times a day (BID) | OROMUCOSAL | Status: DC

## 2017-03-01 MED ORDER — ROCURONIUM BROMIDE 50 MG/5ML IV SOLN
INTRAVENOUS | Status: AC
  Administered 2017-03-01: 50 mg via INTRAVENOUS
  Filled 2017-03-01: qty 1

## 2017-03-01 MED ORDER — ALLOPURINOL 300 MG PO TABS
300.0000 mg | ORAL_TABLET | Freq: Every day | ORAL | Status: DC
  Administered 2017-03-02 – 2017-03-05 (×4): 300 mg via ORAL
  Filled 2017-03-01 (×6): qty 1

## 2017-03-01 MED ORDER — OLANZAPINE 10 MG PO TABS: 15.0000 mg | ORAL_TABLET | Freq: Every day | ORAL | Status: DC

## 2017-03-01 MED ORDER — SODIUM CHLORIDE 0.9 % IV SOLN
0.0000 ug/min | INTRAVENOUS | Status: DC
  Administered 2017-03-01: 20 ug/min via INTRAVENOUS
  Administered 2017-03-01: 200 ug/min via INTRAVENOUS
  Filled 2017-03-01 (×3): qty 1

## 2017-03-01 MED ORDER — PIPERACILLIN-TAZOBACTAM 3.375 G IVPB
3.3750 g | Freq: Two times a day (BID) | INTRAVENOUS | Status: DC
  Administered 2017-03-01 – 2017-03-02 (×3): 3.375 g via INTRAVENOUS
  Filled 2017-03-01 (×3): qty 50

## 2017-03-01 MED ORDER — LACTATED RINGERS IV SOLN
INTRAVENOUS | Status: DC | PRN
  Administered 2017-03-01 (×2): via INTRAVENOUS

## 2017-03-01 MED ORDER — SODIUM CHLORIDE 0.9 % IV SOLN
INTRAVENOUS | Status: DC
  Administered 2017-03-01 – 2017-03-04 (×7): via INTRAVENOUS
  Administered 2017-03-05: 1000 mL via INTRAVENOUS
  Administered 2017-03-05: 08:00:00 via INTRAVENOUS

## 2017-03-01 MED ORDER — MIDAZOLAM HCL 2 MG/2ML IJ SOLN
1.0000 mg | INTRAMUSCULAR | Status: AC | PRN
  Administered 2017-03-01 (×3): 1 mg via INTRAVENOUS
  Filled 2017-03-01 (×3): qty 2

## 2017-03-01 MED ORDER — SODIUM CHLORIDE 0.9% FLUSH
3.0000 mL | Freq: Two times a day (BID) | INTRAVENOUS | Status: DC
  Administered 2017-03-01 – 2017-03-14 (×22): 3 mL via INTRAVENOUS

## 2017-03-01 MED ORDER — FENTANYL CITRATE (PF) 100 MCG/2ML IJ SOLN
50.0000 ug | Freq: Once | INTRAMUSCULAR | Status: AC
  Administered 2017-03-01: 50 ug via INTRAVENOUS

## 2017-03-01 MED ORDER — SODIUM CHLORIDE 0.9 % IV SOLN
0.0000 ug/min | INTRAVENOUS | Status: DC
  Administered 2017-03-01: 200 ug/min via INTRAVENOUS
  Administered 2017-03-01: 220 ug/min via INTRAVENOUS
  Administered 2017-03-01: 200 ug/min via INTRAVENOUS
  Administered 2017-03-02: 220 ug/min via INTRAVENOUS
  Administered 2017-03-02: 400 ug/min via INTRAVENOUS
  Administered 2017-03-02: 260 ug/min via INTRAVENOUS
  Administered 2017-03-02: 220 ug/min via INTRAVENOUS
  Administered 2017-03-02 – 2017-03-03 (×8): 400 ug/min via INTRAVENOUS
  Administered 2017-03-03: 380 ug/min via INTRAVENOUS
  Administered 2017-03-03: 370 ug/min via INTRAVENOUS
  Administered 2017-03-03: 200 ug/min via INTRAVENOUS
  Administered 2017-03-03: 340 ug/min via INTRAVENOUS
  Administered 2017-03-03: 310 ug/min via INTRAVENOUS
  Administered 2017-03-03: 340 ug/min via INTRAVENOUS
  Administered 2017-03-03: 380 ug/min via INTRAVENOUS
  Administered 2017-03-03: 400 ug/min via INTRAVENOUS
  Administered 2017-03-04: 150 ug/min via INTRAVENOUS
  Administered 2017-03-04: 60 ug/min via INTRAVENOUS
  Filled 2017-03-01 (×6): qty 4
  Filled 2017-03-01: qty 40
  Filled 2017-03-01 (×24): qty 4

## 2017-03-01 MED ORDER — VITAMIN D 1000 UNITS PO TABS
1000.0000 [IU] | ORAL_TABLET | Freq: Every day | ORAL | Status: DC
  Administered 2017-03-02 – 2017-03-05 (×4): 1000 [IU] via ORAL
  Filled 2017-03-01 (×4): qty 1

## 2017-03-01 MED ORDER — FENTANYL CITRATE (PF) 100 MCG/2ML IJ SOLN: 25.0000 ug | INTRAMUSCULAR | Status: DC | PRN

## 2017-03-01 MED ORDER — SODIUM CHLORIDE 0.9 % IV BOLUS (SEPSIS): 1000.0000 mL | Freq: Once | INTRAVENOUS | Status: DC

## 2017-03-01 MED ORDER — SODIUM CHLORIDE 0.9 % IV SOLN
0.0100 [IU]/min | INTRAVENOUS | Status: DC
  Administered 2017-03-01: 0.03 [IU]/min via INTRAVENOUS
  Administered 2017-03-02 – 2017-03-03 (×2): 0.04 [IU]/min via INTRAVENOUS
  Filled 2017-03-01 (×3): qty 2

## 2017-03-01 MED ORDER — ALBUTEROL SULFATE (2.5 MG/3ML) 0.083% IN NEBU: 2.5000 mg | INHALATION_SOLUTION | Freq: Four times a day (QID) | RESPIRATORY_TRACT | Status: DC | PRN

## 2017-03-01 MED ORDER — BUPIVACAINE LIPOSOME 1.3 % IJ SUSP
INTRAMUSCULAR | Status: DC | PRN
  Administered 2017-03-01: 20 mL

## 2017-03-01 MED ORDER — BUPIVACAINE LIPOSOME 1.3 % IJ SUSP
INTRAMUSCULAR | Status: AC
  Filled 2017-03-01: qty 20

## 2017-03-01 MED ORDER — KETAMINE HCL 50 MG/ML IJ SOLN
INTRAMUSCULAR | Status: AC
  Filled 2017-03-01: qty 10

## 2017-03-01 MED ORDER — FENTANYL CITRATE (PF) 250 MCG/5ML IJ SOLN
INTRAMUSCULAR | Status: AC
  Filled 2017-03-01: qty 5

## 2017-03-01 MED ORDER — ROCURONIUM BROMIDE 100 MG/10ML IV SOLN
INTRAVENOUS | Status: DC | PRN
  Administered 2017-03-01 (×2): 50 mg via INTRAVENOUS

## 2017-03-01 MED ORDER — NOREPINEPHRINE BITARTRATE 1 MG/ML IV SOLN
0.0000 ug/min | INTRAVENOUS | Status: DC
  Administered 2017-03-01: 2 ug/min via INTRAVENOUS
  Filled 2017-03-01: qty 4

## 2017-03-01 MED ORDER — OLANZAPINE 7.5 MG PO TABS
15.0000 mg | ORAL_TABLET | Freq: Every day | ORAL | Status: DC
  Administered 2017-03-01 – 2017-03-04 (×4): 15 mg
  Filled 2017-03-01 (×5): qty 1.5

## 2017-03-01 MED ORDER — BUPIVACAINE HCL 0.5 % IJ SOLN
INTRAMUSCULAR | Status: DC | PRN
  Administered 2017-03-01: 30 mL

## 2017-03-01 MED ORDER — ROCURONIUM BROMIDE 50 MG/5ML IV SOLN
INTRAVENOUS | Status: AC
  Filled 2017-03-01: qty 1

## 2017-03-01 MED ORDER — VITAMIN B-1 100 MG PO TABS: 100.0000 mg | ORAL_TABLET | Freq: Every day | ORAL | Status: DC

## 2017-03-01 MED ORDER — HEPARIN SODIUM (PORCINE) 1000 UNIT/ML IJ SOLN
INTRAMUSCULAR | Status: AC
  Filled 2017-03-01: qty 1

## 2017-03-01 MED ORDER — ETOMIDATE 2 MG/ML IV SOLN
20.0000 mg | INTRAVENOUS | Status: AC
  Administered 2017-03-01: 20 mg via INTRAVENOUS

## 2017-03-01 MED ORDER — SODIUM CHLORIDE 0.9 % IJ SOLN
INTRAMUSCULAR | Status: AC
  Filled 2017-03-01: qty 50

## 2017-03-01 MED ORDER — ETOMIDATE 2 MG/ML IV SOLN
INTRAVENOUS | Status: AC
  Administered 2017-03-01: 20 mg via INTRAVENOUS
  Filled 2017-03-01: qty 10

## 2017-03-01 MED ORDER — SODIUM CHLORIDE 0.9 % IV BOLUS (SEPSIS)
1000.0000 mL | Freq: Once | INTRAVENOUS | Status: AC
Start: 2017-03-01 — End: 2017-03-01
  Administered 2017-03-01: 1000 mL via INTRAVENOUS

## 2017-03-01 MED ORDER — ACETAMINOPHEN 650 MG RE SUPP: 650.0000 mg | Freq: Four times a day (QID) | RECTAL | Status: DC | PRN

## 2017-03-01 MED ORDER — VALPROATE SODIUM 250 MG/5ML PO SOLN
250.0000 mg | Freq: Three times a day (TID) | ORAL | Status: DC
  Administered 2017-03-01 – 2017-03-05 (×13): 250 mg
  Filled 2017-03-01 (×16): qty 5

## 2017-03-01 MED ORDER — SODIUM CHLORIDE 0.9% FLUSH: 10.0000 mL | INTRAVENOUS | Status: DC | PRN

## 2017-03-01 MED ORDER — LIDOCAINE HCL 2 % EX GEL
CUTANEOUS | Status: AC
  Administered 2017-03-01: 02:00:00
  Filled 2017-03-01: qty 10

## 2017-03-01 MED ORDER — BUPIVACAINE HCL (PF) 0.5 % IJ SOLN
INTRAMUSCULAR | Status: AC
  Filled 2017-03-01: qty 30

## 2017-03-01 MED ORDER — ONDANSETRON HCL 4 MG/2ML IJ SOLN: 4.0000 mg | Freq: Once | INTRAMUSCULAR | Status: DC | PRN

## 2017-03-01 MED ORDER — BISACODYL 5 MG PO TBEC
5.0000 mg | DELAYED_RELEASE_TABLET | Freq: Every day | ORAL | Status: DC | PRN
  Administered 2017-03-03: 5 mg via ORAL
  Filled 2017-03-01: qty 1

## 2017-03-01 MED ORDER — IPRATROPIUM BROMIDE 0.02 % IN SOLN: 0.5000 mg | Freq: Four times a day (QID) | RESPIRATORY_TRACT | Status: DC | PRN

## 2017-03-01 MED ORDER — MIDAZOLAM HCL 2 MG/2ML IJ SOLN
INTRAMUSCULAR | Status: AC
  Filled 2017-03-01: qty 2

## 2017-03-01 MED ORDER — FENTANYL 2500MCG IN NS 250ML (10MCG/ML) PREMIX INFUSION
25.0000 ug/h | INTRAVENOUS | Status: DC
  Administered 2017-03-01: 50 ug/h via INTRAVENOUS
  Administered 2017-03-02: 100 ug/h via INTRAVENOUS
  Administered 2017-03-03: 75 ug/h via INTRAVENOUS
  Administered 2017-03-04: 125 ug/h via INTRAVENOUS
  Administered 2017-03-05: 150 ug/h via INTRAVENOUS
  Filled 2017-03-01 (×4): qty 250

## 2017-03-01 MED ORDER — SENNOSIDES-DOCUSATE SODIUM 8.6-50 MG PO TABS: 1.0000 | ORAL_TABLET | Freq: Every evening | ORAL | Status: DC | PRN

## 2017-03-01 MED ORDER — ORAL CARE MOUTH RINSE
15.0000 mL | OROMUCOSAL | Status: DC
  Administered 2017-03-01 – 2017-03-05 (×45): 15 mL via OROMUCOSAL

## 2017-03-01 MED ORDER — CHLORHEXIDINE GLUCONATE CLOTH 2 % EX PADS: 6.0000 | MEDICATED_PAD | Freq: Once | CUTANEOUS | Status: DC

## 2017-03-01 MED ORDER — MIDAZOLAM HCL 2 MG/2ML IJ SOLN
INTRAMUSCULAR | Status: DC | PRN
  Administered 2017-03-01: 2 mg via INTRAVENOUS

## 2017-03-01 MED ORDER — TRAZODONE HCL 50 MG PO TABS
50.0000 mg | ORAL_TABLET | Freq: Every day | ORAL | Status: DC
  Administered 2017-03-01: 50 mg via ORAL
  Filled 2017-03-01: qty 1

## 2017-03-01 MED ORDER — FENTANYL CITRATE (PF) 100 MCG/2ML IJ SOLN
50.0000 ug | INTRAMUSCULAR | Status: DC | PRN
  Administered 2017-03-01 – 2017-03-05 (×2): 50 ug via INTRAVENOUS
  Filled 2017-03-01 (×2): qty 2

## 2017-03-01 MED ORDER — VITAMIN B-12 1000 MCG PO TABS
1000.0000 ug | ORAL_TABLET | Freq: Every day | ORAL | Status: DC
  Administered 2017-03-02 – 2017-03-05 (×4): 1000 ug
  Filled 2017-03-01 (×4): qty 1

## 2017-03-01 MED ORDER — ENOXAPARIN SODIUM 40 MG/0.4ML ~~LOC~~ SOLN: 40.0000 mg | SUBCUTANEOUS | Status: DC

## 2017-03-01 MED ORDER — CHLORHEXIDINE GLUCONATE 0.12% ORAL RINSE (MEDLINE KIT)
15.0000 mL | Freq: Two times a day (BID) | OROMUCOSAL | Status: DC
  Administered 2017-03-01 – 2017-03-05 (×10): 15 mL via OROMUCOSAL

## 2017-03-01 MED ORDER — ACETAMINOPHEN 325 MG PO TABS: 650.0000 mg | ORAL_TABLET | Freq: Four times a day (QID) | ORAL | Status: DC | PRN

## 2017-03-01 MED ORDER — THIAMINE HCL 100 MG/ML IJ SOLN
100.0000 mg | Freq: Every day | INTRAMUSCULAR | Status: DC
  Administered 2017-03-01 – 2017-03-11 (×11): 100 mg via INTRAVENOUS
  Filled 2017-03-01: qty 1
  Filled 2017-03-01: qty 2
  Filled 2017-03-01: qty 1
  Filled 2017-03-01 (×3): qty 2
  Filled 2017-03-01 (×2): qty 1
  Filled 2017-03-01 (×2): qty 2
  Filled 2017-03-01: qty 1

## 2017-03-01 MED ORDER — FENTANYL 2500MCG IN NS 250ML (10MCG/ML) PREMIX INFUSION
25.0000 ug/h | INTRAVENOUS | Status: DC
  Administered 2017-03-01: 100 ug/h via INTRAVENOUS
  Filled 2017-03-01: qty 250

## 2017-03-01 MED ORDER — MIDAZOLAM HCL 2 MG/2ML IJ SOLN
1.0000 mg | INTRAMUSCULAR | Status: DC | PRN
  Administered 2017-03-01 – 2017-03-03 (×3): 1 mg via INTRAVENOUS
  Filled 2017-03-01 (×3): qty 2

## 2017-03-01 MED ORDER — DIVALPROEX SODIUM ER 500 MG PO TB24
500.0000 mg | ORAL_TABLET | Freq: Every day | ORAL | Status: DC
  Filled 2017-03-01: qty 1

## 2017-03-01 MED ORDER — FAMOTIDINE IN NACL 20-0.9 MG/50ML-% IV SOLN
20.0000 mg | INTRAVENOUS | Status: DC
  Administered 2017-03-01 – 2017-03-06 (×6): 20 mg via INTRAVENOUS
  Filled 2017-03-01 (×7): qty 50

## 2017-03-01 MED ORDER — KETAMINE HCL 50 MG/ML IJ SOLN
INTRAMUSCULAR | Status: DC | PRN
  Administered 2017-03-01: 25 mg via INTRAMUSCULAR

## 2017-03-01 MED ORDER — MAGNESIUM CITRATE PO SOLN
1.0000 | Freq: Once | ORAL | Status: DC | PRN
  Filled 2017-03-01: qty 296

## 2017-03-01 MED ORDER — SODIUM CHLORIDE 0.9 % IV BOLUS (SEPSIS)
500.0000 mL | Freq: Once | INTRAVENOUS | Status: AC
  Administered 2017-03-01: 500 mL via INTRAVENOUS

## 2017-03-01 MED ORDER — DIVALPROEX SODIUM ER 250 MG PO TB24
250.0000 mg | ORAL_TABLET | Freq: Every day | ORAL | Status: DC
  Filled 2017-03-01: qty 1

## 2017-03-01 MED ORDER — ONDANSETRON HCL 4 MG/2ML IJ SOLN
INTRAMUSCULAR | Status: AC
  Filled 2017-03-01: qty 2

## 2017-03-01 MED ORDER — PHENYLEPHRINE HCL 10 MG/ML IJ SOLN
INTRAMUSCULAR | Status: DC | PRN
  Administered 2017-03-01: 100 ug via INTRAVENOUS

## 2017-03-01 MED ORDER — PIPERACILLIN-TAZOBACTAM 3.375 G IVPB: 3.3750 g | Freq: Four times a day (QID) | INTRAVENOUS | Status: DC

## 2017-03-01 MED ORDER — ATORVASTATIN CALCIUM 20 MG PO TABS: 20.0000 mg | ORAL_TABLET | Freq: Every day | ORAL | Status: DC

## 2017-03-01 MED ORDER — NOREPINEPHRINE BITARTRATE 1 MG/ML IV SOLN
0.0000 ug/min | INTRAVENOUS | Status: DC
  Administered 2017-03-01: 20 ug/min via INTRAVENOUS
  Administered 2017-03-01 – 2017-03-02 (×4): 60 ug/min via INTRAVENOUS
  Administered 2017-03-02: 40 ug/min via INTRAVENOUS
  Filled 2017-03-01 (×11): qty 16

## 2017-03-01 MED ORDER — ROCURONIUM BROMIDE 50 MG/5ML IV SOLN
50.0000 mg | INTRAVENOUS | Status: AC
  Administered 2017-03-01: 50 mg via INTRAVENOUS

## 2017-03-01 MED ORDER — FENTANYL CITRATE (PF) 100 MCG/2ML IJ SOLN
50.0000 ug | INTRAMUSCULAR | Status: DC | PRN
  Filled 2017-03-01: qty 2

## 2017-03-01 MED ORDER — SODIUM CHLORIDE 0.9% FLUSH
10.0000 mL | Freq: Two times a day (BID) | INTRAVENOUS | Status: DC
  Administered 2017-03-01: 30 mL
  Administered 2017-03-01 – 2017-03-05 (×6): 10 mL
  Administered 2017-03-06: 23:00:00 20 mL
  Administered 2017-03-06 – 2017-03-07 (×2): 10 mL

## 2017-03-01 MED ORDER — HEPARIN SODIUM (PORCINE) 5000 UNIT/ML IJ SOLN
5000.0000 [IU] | Freq: Two times a day (BID) | INTRAMUSCULAR | Status: DC
  Administered 2017-03-01 (×2): 5000 [IU] via SUBCUTANEOUS
  Filled 2017-03-01 (×2): qty 1

## 2017-03-01 MED ORDER — ATORVASTATIN CALCIUM 20 MG PO TABS
20.0000 mg | ORAL_TABLET | Freq: Every day | ORAL | Status: DC
  Administered 2017-03-02 – 2017-03-05 (×4): 20 mg
  Filled 2017-03-01 (×4): qty 1

## 2017-03-01 MED ORDER — VASOPRESSIN 20 UNIT/ML IV SOLN
INTRAVENOUS | Status: DC | PRN
  Administered 2017-03-01: 1 [IU] via INTRAVENOUS

## 2017-03-01 SURGICAL SUPPLY — 94 items
APPLIER CLIP 11 MED OPEN (CLIP)
APPLIER CLIP 13 LRG OPEN (CLIP)
BAG BILE T-TUBES STRL (MISCELLANEOUS) IMPLANT
BAG HAMPER (MISCELLANEOUS) IMPLANT
BARRIER SKIN 2 3/4 (OSTOMY) IMPLANT
BARRIER SKIN 2 3/4 INCH (OSTOMY)
BLADE SURG SZ10 CARB STEEL (BLADE) ×3 IMPLANT
CELLS DAT CNTRL 66122 CELL SVR (MISCELLANEOUS) IMPLANT
CHLORAPREP W/TINT 26ML (MISCELLANEOUS) ×6 IMPLANT
CLAMP POUCH DRAINAGE QUIET (OSTOMY) IMPLANT
CLIP APPLIE 11 MED OPEN (CLIP) IMPLANT
CLIP APPLIE 13 LRG OPEN (CLIP) IMPLANT
CLOTH BEACON ORANGE TIMEOUT ST (SAFETY) IMPLANT
COVER LIGHT HANDLE STERIS (MISCELLANEOUS) ×6 IMPLANT
COVER MAYO STAND XLG (DRAPE) ×3 IMPLANT
DRAPE PROXIMA HALF (DRAPES) IMPLANT
DRAPE WARM FLUID 44X44 (DRAPE) ×3 IMPLANT
DRSG OPSITE POSTOP 4X10 (GAUZE/BANDAGES/DRESSINGS) IMPLANT
DURAPREP 26ML APPLICATOR (WOUND CARE) IMPLANT
ELECT BLADE 6 FLAT ULTRCLN (ELECTRODE) ×3 IMPLANT
ELECT REM PT RETURN 9FT ADLT (ELECTROSURGICAL) ×3
ELECTRODE REM PT RTRN 9FT ADLT (ELECTROSURGICAL) ×1 IMPLANT
EVACUATOR DRAINAGE 10X20 100CC (DRAIN) IMPLANT
EVACUATOR SILICONE 100CC (DRAIN)
GAUZE SPONGE 4X4 12PLY STRL (GAUZE/BANDAGES/DRESSINGS) ×3 IMPLANT
GAUZE SPONGE 4X4 16PLY XRAY LF (GAUZE/BANDAGES/DRESSINGS) IMPLANT
GLOVE BIOGEL PI IND STRL 7.5 (GLOVE) ×2 IMPLANT
GLOVE BIOGEL PI INDICATOR 7.5 (GLOVE) ×4
GLOVE ECLIPSE 7.0 STRL STRAW (GLOVE) ×6 IMPLANT
GOWN STRL REUS W/TWL LRG LVL3 (GOWN DISPOSABLE) ×6 IMPLANT
HANDLE SUCTION POOLE (INSTRUMENTS) ×1 IMPLANT
INST SET MAJOR GENERAL (KITS) IMPLANT
KIT RM TURNOVER STRD PROC AR (KITS) ×3 IMPLANT
KIT ROOM TURNOVER APOR (KITS) ×3 IMPLANT
MANIFOLD NEPTUNE II (INSTRUMENTS) IMPLANT
NS IRRIG 1000ML POUR BTL (IV SOLUTION) ×3 IMPLANT
PACK BASIN MAJOR ARMC (MISCELLANEOUS) ×3 IMPLANT
PACK COLON CLEAN CLOSURE (MISCELLANEOUS) IMPLANT
PACK PERI GYN (CUSTOM PROCEDURE TRAY) IMPLANT
PAD ABD 5X9 TENDERSORB (GAUZE/BANDAGES/DRESSINGS) IMPLANT
PAD ARMBOARD 7.5X6 YLW CONV (MISCELLANEOUS) IMPLANT
PENCIL HANDSWITCHING (ELECTRODE) ×3 IMPLANT
RELOAD LINEAR CUT PROX 55 BLUE (ENDOMECHANICALS) IMPLANT
RELOAD PROXIMATE 75MM BLUE (ENDOMECHANICALS) IMPLANT
RELOAD STAPLER LINEAR PROX 30 (STAPLE) IMPLANT
RETRACTOR WND ALEXIS 25 LRG (MISCELLANEOUS) ×1 IMPLANT
RETRACTOR WND ALEXIS-O 25 LRG (MISCELLANEOUS) IMPLANT
RETRACTOR WOUND ALXS 18CM MED (MISCELLANEOUS) IMPLANT
RETRACTOR WOUND ALXS 34CM XLRG (MISCELLANEOUS) IMPLANT
RTRCTR WOUND ALEXIS 18CM MED (MISCELLANEOUS)
RTRCTR WOUND ALEXIS 25CM LRG (MISCELLANEOUS) ×3
RTRCTR WOUND ALEXIS 34CM XLRG (MISCELLANEOUS)
RTRCTR WOUND ALEXIS O 18CM MED (MISCELLANEOUS)
RTRCTR WOUND ALEXIS O 25CM LRG (MISCELLANEOUS)
SEALER TISSUE X1 CVD JAW (INSTRUMENTS) ×3 IMPLANT
SET BASIN LINEN APH (SET/KITS/TRAYS/PACK) ×3 IMPLANT
SPONGE INTESTINAL PEANUT (DISPOSABLE) IMPLANT
SPONGE LAP 18X18 X RAY DECT (DISPOSABLE) ×3 IMPLANT
STAPLER CIRC CVD 29MM 37CM (STAPLE) IMPLANT
STAPLER GUN LINEAR PROX 60 (STAPLE) IMPLANT
STAPLER PROXIMATE 55 BLUE (STAPLE) IMPLANT
STAPLER PROXIMATE 75MM BLUE (STAPLE) IMPLANT
STAPLER RELOAD LINEAR PROX 30 (STAPLE)
STAPLER SKIN PROX 35W (STAPLE) ×3 IMPLANT
STAPLER SYS INTERNAL RELOAD SS (MISCELLANEOUS) IMPLANT
STAPLER VISISTAT (STAPLE) IMPLANT
SUCTION POOLE HANDLE (INSTRUMENTS) ×3
SUT CHROMIC 0 SH (SUTURE) IMPLANT
SUT CHROMIC 2 0 SH (SUTURE) IMPLANT
SUT CHROMIC 3 0 SH 27 (SUTURE) IMPLANT
SUT ETHILON 3 0 FSL (SUTURE) IMPLANT
SUT PDS #1 CTX NDL (SUTURE) IMPLANT
SUT PDS AB 0 CT1 27 (SUTURE) IMPLANT
SUT PDS AB 0 CTX 60 (SUTURE) ×3 IMPLANT
SUT PDS AB 1 TP1 96 (SUTURE) ×6 IMPLANT
SUT PROLENE 3 0 PS 2 (SUTURE) IMPLANT
SUT SILK 0 FSL (SUTURE) IMPLANT
SUT SILK 2 0 (SUTURE) ×2
SUT SILK 2 0 REEL (SUTURE) IMPLANT
SUT SILK 2-0 18XBRD TIE 12 (SUTURE) ×1 IMPLANT
SUT SILK 3 0 SH CR/8 (SUTURE) ×3 IMPLANT
SUT SILK 3-0 (SUTURE)
SUT SILK 3-0 SH-1 18XCR BRD (SUTURE)
SUT VIC AB 0 CT1 18XCR BRD 8 (SUTURE) IMPLANT
SUT VIC AB 0 CT1 8-18 (SUTURE)
SUT VIC AB 3-0 SH 27 (SUTURE) ×2
SUT VIC AB 3-0 SH 27X BRD (SUTURE) ×1 IMPLANT
SUT VICRYL AB 2 0 TIES (SUTURE) IMPLANT
SUT VICRYL AB 3 0 TIES (SUTURE) IMPLANT
SUTURE SILK 3-0 SH-1 18XCR BRD (SUTURE) IMPLANT
TOWEL BLUE STERILE X RAY DET (MISCELLANEOUS) ×3 IMPLANT
TRAY FOLEY BAG SILVER LF 16FR (CATHETERS) ×3 IMPLANT
TRAY FOLEY CATH SILVER 16FR LF (SET/KITS/TRAYS/PACK) IMPLANT
YANKAUER SUCT BULB TIP 10FT TU (MISCELLANEOUS) IMPLANT

## 2017-03-01 NOTE — Op Note (Signed)
SURGICAL OPERATIVE REPORT  DATE OF PROCEDURE: 03/01/2017  ATTENDING Surgeon(s): Ancil Linseyavis, Jason Evan, MD  ANESTHESIA: general (already intubated for respiratory distress with aspiration pneumonia)  PRE-OPERATIVE DIAGNOSIS: Focal small bowel ischemia (icd-10: K55.069)  POST-OPERATIVE DIAGNOSIS: Diffuse transient/reversible small bowel ischemia (icd-10: K55.069)  PROCEDURE(S):  1.) Exploratory laparotomy without small bowel resection (cpt: 49000)  INTRAOPERATIVE FINDINGS: Diffuse transient    INTRAVENOUS FLUIDS: 3400 mL crystalloid (remaining on 15 mcg/min norepinephrine)  ESTIMATED BLOOD LOSS: Minimal (<30 mL)  URINE OUTPUT: 500 mL (dark)  NGT: 400 mL + a lot of bowel gas  SPECIMENS: None  IMPLANTS: None  DRAINS: none  COMPLICATIONS: None apparent  CONDITION AT END OF PROCEDURE: Remaining intubated for aspiration pneumonia and critically ill  DISPOSITION OF PATIENT: ICU  INDICATIONS FOR PROCEDURE:  76 y.o. male [facility] with N/V and diarrhea x 7 days. Patient  Had today become acutely ill and less responsive with worsened SOB, concerning for aspiration, and he began also reporting abdominal pain x uncertain duration. While in ED, initial O2 sat was 85% and BIPAP was attempted, but patient vomited and was intubated. He was also hypotensive on presentation. CT demonstrated LLQ small bowel pneumatosis and mesenteric venous gas with lactate of 7.3, together concerning for acute mesenteric ischemia. Though CT was without IV contrast due to AKI with Cr 4.75, no calcification was visualized at celiac artery or SMA origins, suggesting possible small bowel ischemia attributable to low-flow (hypovolemic) state with increasing vasopressor requirement despite fluid resuscitation >4 Liters crystalloid. Accordingly, all risks, benefits, and alternatives to laparotomy were discussed with the patient's daughter (POA), all of patient's daughter's questions were answered to  her expressed satisfaction, and informed consent was obtained and documented.  DETAILS OF PROCEDURE: Patient was brought to the operating suite and appropriately identified. As mentioned above, patient was already intubated for respiratory distress with general anesthesia ongoing accordingly. Appropriate antibiotics were confirmed. In supine position, operative site was prepped and draped in the usual sterile fashion, and following a brief time out, vertical midline laparotomy incision was made using a #10 blade scalpel, including to the Left of the umbilicus, and incision was extended deep through subcutaneous tissue to fascia using electrocautery, which was likewise used to divide the linea alba between patient's rectus muscles, along the length of the incision, following which peritoneum was identified and carefully entered and similarly divided, taking care to avoid injury to underlying dilated small bowel. Copius murky ascites was encountered and aspirated.   Large wound protector was selected and inserted, and small intestine was carefully and systematically eviscerated and evaluated with visualization and palpation in its entirety from ligament of Treitz to ileocolic junction. All of patient's small intestine initially appeared purple with fibrinous exudative film adherent to LLQ bowel in particular, but all of the intestine became pink upon evisceration, taking care not to twist the mesentery. During inspection of the intestine, copious gas and fluid were expressed retrograde towards patient's stomach, in which position of NG tube was confirmed by palpation. Bowel was returned into the abdominal cavity, and colon and peritoneal cavity were also evaluated with no additional pathology identified. Decision was at this time made to close patient's abdomen.  Exparel mixed with marcaine was injected into fascia. Fascia was re-approximated from apex to base of the incision using #1 looped PDS with no  increase in peak airway ventilator pressures, after which buried interrupted 3-0 Vicryl suture was used to reapproximate dermis. Remainder of Exparel was injected subcutaneously along skin  incision, and surgical skin staples were used to close skin, which was cleaned, dried, and sterile occlusive dressing was applied. Patient was then transferred back to ICU remaining intubated due to pre-operative aspiration pneumonia with respiratory distress on unchanged Levophed and increased SBP without further increased vasopressor requirement.  I was present for all aspects of the above procedure, and there were no complications apparent.

## 2017-03-01 NOTE — Progress Notes (Signed)
Lovenox changed to heparin SQ 5000 units q 12 hours for CrCl <15.

## 2017-03-01 NOTE — Procedures (Signed)
Central Venous Catheter Insertion Procedure Note - Right Internal Jugular Alvin PeckJohn Hegel [id number] 03/29/1941  Procedure: Insertion of Central Venous Catheter Indications: Assessment of intravascular volume, Drug and/or fluid administration and Frequent blood sampling  Procedure Details Consent: Unable to obtain consent because of emergent medical necessity. Time Out: Verified patient identification, verified procedure, site/side was marked, verified correct patient position, special equipment/implants available, medications/allergies/relevent history reviewed, required imaging and test results available.  Performed  Maximum sterile technique was used including antiseptics, cap, gloves, gown, hand hygiene, mask and sheet. Skin prep: Chlorhexidine; local anesthetic administered A antimicrobial bonded/coated triple lumen catheter was placed in the right internal jugular vein using the Seldinger technique.  Evaluation Blood flow good Complications: No apparent complications Patient did tolerate procedure well. Chest X-ray ordered to verify placement.  CXR: pending.  Procedure performed under direct ultrasound guidance for real time vessel cannulation.      Bincy Varughese,AG-ACNP Pulmonary & Critical Care

## 2017-03-01 NOTE — Progress Notes (Signed)
Initial Nutrition Assessment  DOCUMENTATION CODES:   Obesity unspecified  INTERVENTION:  Patient is currently unable to receive enteral nutrition. OGT is to LIS.  Once patient is hemodynamically stable and able to initiate enteral nutrition, recommend initiating Vital High Protein at 20 ml/hr + Pro-Stat 30 ml TID. After 8 hours if patient is tolerating, can advance to goal tube feed regimen of Vital High Protein at 45 ml/hr + Pro-Stat 30 ml TID. Goal regimen provides 1380 kcal, 140 grams of protein, 904 ml H2O daily.  Recommend liquid multivitamin with minerals daily per tube when able to initiate tube feeding. Goal regimen does not meet 100% RDIs.  NUTRITION DIAGNOSIS:   Inadequate oral intake related to inability to eat as evidenced by NPO status.  GOAL:   Provide needs based on ASPEN/SCCM guidelines  MONITOR:   Vent status, Labs, Weight trends, TF tolerance, I & O's  REASON FOR ASSESSMENT:   Ventilator    ASSESSMENT:   76 year old male with PMHx of HTN, bipolar disorder who presented with N/V for 3 days found to have ischemic small bowel with small bowel pneumatosis in left lower abdomen. Sent to ICU with SOB secondary to PNA requiring intubation. Pt s/p exploratory laparotomy without SBR on 7/25.   -Per Op note intestine became pink upon evisceration.  Patient discussed in rounds today. Plan is to hold off on initiation of enteral nutrition at this time.  Access: OGT currently to LIS; 1400 ml output past 24 hrs  MAP: variable - 50-77 today  Patient is currently intubated on ventilator support MV: 17.8 L/min Temp (24hrs), Avg:98.4 F (36.9 C), Min:97.1 F (36.2 C), Max:100.1 F (37.8 C)  Propofol: N/A  Medications reviewed and include: allopurinol, vitamin D 1000 units daily, thiamine 100 mg daily IV, vitamin B12 1000 micrograms daily per tube, NS @ 100 ml/hr, famotidine, fentanyl gtt, norepinephrine gtt, phenylephrine gtt, Zosyn.  Labs reviewed: Sodium 134,  BUN 62, Creatinine 3.4, elevated Troponin, Lactic Acid 3.4.  Nutrition-Focused physical exam completed. Findings are no fat depletion, no muscle depletion, and non-pitting edema.   Diet Order:     Skin:  Wound (see comment) (closed incision abdomen)  Last BM:  02/28/2017  Height:   Ht Readings from Last 1 Encounters:  03/01/17 5\' 8"  (1.727 m)    Weight:   Wt Readings from Last 1 Encounters:  03/01/17 216 lb 14.9 oz (98.4 kg)    Ideal Body Weight:  70 kg  BMI:  Body mass index is 32.98 kg/m.  Estimated Nutritional Needs:   Kcal:  [id number] (11-14 kcal/kg)  Protein:  >/= 140 grams (>/= 2 g/kg IBW)  Fluid:  1.8 L/day (25 ml/kg IBW)  EDUCATION NEEDS:   No education needs identified at this time  Helane RimaLeanne Koreena Joost, MS, RD, LDN Pager: [phone] After Hours Pager: [phone]

## 2017-03-01 NOTE — Progress Notes (Signed)
Pt intubated 0240 without complications. Airway secure. bs blateral and clear. 7.5 @25  lip center. Intubated by Bincy NP.

## 2017-03-01 NOTE — Progress Notes (Addendum)
Pt receiving levo at 60 mcgs/min (this is above max ordered range) and neosynephrine at 200 mcg/min.  UOP low and MAP goal just maintained.  Per Dr Ardyth Manam give NS bolus.     Oral temp 100.1.  Ice packs to axillae

## 2017-03-01 NOTE — Care Management (Signed)
BPs acceptable at present. IM taking care of pt.

## 2017-03-01 NOTE — Anesthesia Post-op Follow-up Note (Cosign Needed)
Anesthesia QCDR form completed.        

## 2017-03-01 NOTE — Progress Notes (Signed)
eLink Physician-Brief Progress Note Patient Name: Alvin PeckJohn [name] DOB: 05/01/1941 MRN: [id number]   Date of Service  03/01/2017  HPI/Events of Note  76 yo male with PMH of HTN and Bipolar Disorder. N/V X 3 days. Now admitted with likely aspiration pneumonia and sepsis. On arrival to ICU respiratory status is marginal on BiPAP with sat = 91-92% and RR = 36. Called by APP about intubation and I am in favor of intubation and mechanical ventilation.   eICU Interventions  Spoke with APP at bedside. Decision to intubate and ventilate. APP will intubate.      Intervention Category Evaluation Type: New Patient Evaluation  Alvin [name] Eugene 03/01/2017, 2:12 AM

## 2017-03-01 NOTE — Progress Notes (Signed)
[facility] [place] [facility]   PATIENT NAME: Alvin PeckJohn [name]    MR#:  [id number]  DATE OF BIRTH:  07/19/1941  SUBJECTIVE:  CHIEF COMPLAINT:   Chief Complaint  Patient presents with  . Shortness of Breath     Came with septic shock due to small bowel ischemia, on vent and vasopressors support.   REVIEW OF SYSTEMS:  Intubated on sedation, can not give ROS.  ROS  DRUG ALLERGIES:  No Known Allergies  VITALS:  Blood pressure (!) 92/47, pulse 62, temperature 97.8 F (36.6 C), temperature source Axillary, resp. rate (!) 28, height 5\' 8"  (1.727 m), weight 98.4 kg (216 lb 14.9 oz), SpO2 95 %.  PHYSICAL EXAMINATION:  GENERAL:  76 y.o.-year-old patient lying in the bed with critical appearance.  EYES: Pupils equal, round, reactive to light and accommodation. No scleral icterus.  HEENT: Head atraumatic, normocephalic. Oropharynx and nasopharynx clear.  NECK:  Supple, no jugular venous distention. No thyroid enlargement, no tenderness. ETT in place. LUNGS: Normal breath sounds bilaterally, no wheezing, rales,rhonchi or crepitation. No use of accessory muscles of respiration. On vent support. CARDIOVASCULAR: S1, S2 normal. No murmurs, rubs, or gallops.  ABDOMEN: Soft, nontender, some distended. Bowel sounds sluggish. No organomegaly or mass.  EXTREMITIES: No pedal edema, cyanosis, or clubbing.  NEUROLOGIC: sedated with ventilatory support. PSYCHIATRIC: The patient is sedated.  SKIN: No obvious rash, lesion, or ulcer.   Physical Exam LABORATORY PANEL:   CBC  Recent Labs Lab 03/01/17 0801  WBC 16.4*  HGB 14.8  HCT 44.5  PLT 152   ------------------------------------------------------------------------------------------------------------------  Chemistries   Recent Labs Lab 03/01/17 0330 03/01/17 0801  NA 135 134*  K 5.0 4.7  CL 98* 101  CO2 24 24  GLUCOSE 170* 164*  BUN 65* 62*  CREATININE 3.90* 3.40*  CALCIUM 7.9* 7.5*  MG  --  1.9  AST 56*  --    ALT 33  --   ALKPHOS 58  --   BILITOT 0.9  --    ------------------------------------------------------------------------------------------------------------------  Cardiac Enzymes  Recent Labs Lab 03/01/17 0801 03/01/17 1356  TROPONINI 0.09* 0.09*   ------------------------------------------------------------------------------------------------------------------  RADIOLOGY:  Ct Abdomen Pelvis Wo Contrast  Result Date: 03/01/2017 CLINICAL DATA:  Abdominal pain and distension.  Nausea and vomiting. EXAM: CT ABDOMEN AND PELVIS WITHOUT CONTRAST TECHNIQUE: Multidetector CT imaging of the abdomen and pelvis was performed following the standard protocol without IV contrast. COMPARISON:  None. FINDINGS: Lower chest: Pacemaker partially included. Small left pleural effusion with adjacent basilar airspace disease, atelectasis versus pneumonia. Small right pleural effusion with adjacent atelectasis. Mild cardiomegaly. Hepatobiliary: Question of nodular hepatic contours. No evidence of focal hepatic lesion on noncontrast exam. Multiple calcified gallstones including possible stone in the gallbladder neck. No biliary dilatation. Pancreas: There is soft tissue stranding about the entire pancreas consistent with pancreatitis. Small amount peripancreatic fluid. No loculated fluid collection. No ductal dilatation. Spleen: Normal in size without focal abnormality. Minimal perisplenic fluid tracking from the pancreatic process. Adrenals/Urinary Tract: Mild bilateral adrenal thickening without discrete nodule. No hydronephrosis. Mild nonspecific perinephric edema. No urolithiasis. Urinary bladder is physiologically distended. There is mild bladder wall thickening. Small left lateral bladder diverticulum. Stomach/Bowel: There is an enteric tube with tip in the distal esophagus. Stomach distended with fluid. There is a duodenum diverticulum. Dilated fluid-filled small bowel diffusely. Small bowel pneumatosis in the  left mid abdomen, image 64 series 2. Degree of dilatation diminishes distally. No discrete transition point. Normal appendix. Mild gaseous distention of  colon without colonic wall thickening. Diverticulosis of the sigmoid colon without acute inflammation. Vascular/Lymphatic: There is air in the mesenteric vessels, for example image 58 series 2. This is in the region of small bowel pneumatosis. Minimal air tracks into the proximal SMV. Aortic atherosclerosis without aneurysm. No definite adenopathy. Reproductive: Prominent prostate gland with central calcifications. Other: Small volume abdominopelvic ascites. No free air. No loculated abscess. Musculoskeletal: Degenerative change in the spine. There are no acute or suspicious osseous abnormalities. IMPRESSION: 1. Findings consistent with ischemic small bowel with small bowel pneumatosis in the left lower abdomen and mesenteric gas. No free air to suggest perforation. Diffusely dilated fluid-filled bowel without transition point suggesting ileus. 2. Acute pancreatitis. Peripancreatic inflammation and small amount peripancreatic fluid. 3. Tip of the enteric tube in the distal esophagus, recommend advancement. 4. Nodular hepatic contours suggesting cirrhosis. 5. Cholelithiasis. 6.  Aortic Atherosclerosis (ICD10-I70.0). Critical Value/emergent results were called by telephone at the time of interpretation on 03/01/2017 at 2:28 am to NP [facility] , who verbally acknowledged these results. Electronically Signed   By: Rubye OaksMelanie  Ehinger M.D.   On: 03/01/2017 02:30   Dg Abd 1 View  Result Date: 03/01/2017 CLINICAL DATA:  OG tube placement. EXAM: ABDOMEN - 1 VIEW COMPARISON:  Abdominal CT earlier this day. FINDINGS: Tip and side port of the enteric tube is been advanced, now in the stomach. Gas just distention of stomach and small bowel. No evidence of free air. IMPRESSION: Tip and side port of the enteric tube in the stomach. Electronically Signed   By: Rubye OaksMelanie  Ehinger  M.D.   On: 03/01/2017 03:52   Ct Head Wo Contrast  Result Date: 03/01/2017 CLINICAL DATA:  Altered mental status. EXAM: CT HEAD WITHOUT CONTRAST TECHNIQUE: Contiguous axial images were obtained from the base of the skull through the vertex without intravenous contrast. COMPARISON:  None. FINDINGS: Brain: No evidence of acute infarction, hemorrhage, hydrocephalus, extra-axial collection or mass lesion/mass effect. Age-related cerebral atrophy with compensatory dilatation of the ventricles. Vascular: No hyperdense vessel or unexpected calcification. Skull: Normal. Negative for fracture or focal lesion. Sinuses/Orbits: Mild mucosal thickening of the left maxillary sinus. The remaining paranasal sinuses and bilateral mastoid air cells are clear. The orbits are unremarkable. Other: None. IMPRESSION: No acute intracranial abnormality. Electronically Signed   By: Obie DredgeWilliam T Derry M.D.   On: 03/01/2017 09:52   Dg Chest Port 1 View  Result Date: 03/01/2017 CLINICAL DATA:  Altered level of consciousness EXAM: PORTABLE CHEST 1 VIEW COMPARISON:  03/01/2017 FINDINGS: Support devices are stable. Low lung volumes with bibasilar atelectasis and mild vascular congestion. Mild cardiomegaly. Possible small layering effusions. IMPRESSION: Low lung volumes with bibasilar atelectasis and small layering effusions. Mild vascular congestion. Electronically Signed   By: Charlett NoseKevin  Dover M.D.   On: 03/01/2017 09:18   Dg Chest Port 1 View  Result Date: 03/01/2017 CLINICAL DATA:  Intubation EXAM: PORTABLE CHEST 1 VIEW COMPARISON:  02/28/2017 FINDINGS: ET tube tip is 1.6 cm above the carina. Right-sided central line terminates at the expected location of the cavoatrial junction. Enteric tube extends below the diaphragm and beyond the inferior edge of the image. No pneumothorax. Persistent central and basilar opacities without significant interval change. IMPRESSION: 1. Satisfactorily positioned support equipment. 2. No significant  interval change in the bilateral airspace opacities. 3. No pneumothorax Electronically Signed   By: Ellery Plunkaniel R Mitchell M.D.   On: 03/01/2017 03:53   Dg Chest Port 1 View  Result Date: 02/28/2017 CLINICAL DATA:  Vomiting for several days EXAM:  PORTABLE CHEST 1 VIEW COMPARISON:  None. FINDINGS: Cardiac shadow is mildly enlarged. Pacing device is noted. Mild left basilar opacity and small effusion are seen. The right lung is clear. The bony structures are within normal limits. IMPRESSION: Left basilar opacity with associated small effusion. Electronically Signed   By: Alcide Clever M.D.   On: 02/28/2017 22:25    ASSESSMENT AND PLAN:   Active Problems:   Severe sepsis with septic shock (HCC)   Acute respiratory failure with hypoxemia (HCC)   Abdominal distention   Lactic acidosis   Elevated lipase  #. Severe sepsis with septic shock and respi failure secondary to ischemic bowel and pneumonia. - intubated, vent support, needing vasopressors. - IV antibiotics: Maxipime, Vanco - IV fluid hydration - Trend lactate - Follow up blood,urine & sputum cultures - Repeat CBC in am.   #. Acute respiratory failure with hypoxia - vent support.  #. Abdominal distention rule out bowel obstruction- ileus and ischemic bowel. - NPO - surgical consultation  #. Renal insufficiency, acute due to shock - IV fluids and repeat BMP in AM.  - Avoid nephrotoxic medications  #. Elevated troponin, likely secondary to demand ischemia - Trend troponins - Monitor on telemetry  #. Elevated lipase - Follow up   #. History of BPH  #. History of Bipolar disorder   All the records are reviewed and case discussed with Care Management/Social Workerr. Management plans discussed with the patient, family and they are in agreement.  CODE STATUS: Full.  TOTAL TIME TAKING CARE OF THIS PATIENT: 35 minutes.   Prognosis very poor.  POSSIBLE D/C IN 2-3 DAYS, DEPENDING ON CLINICAL CONDITION.   Altamese Dilling M.D on 03/01/2017   Between 7am to 6pm - Pager - [phone]  After 6pm go to [email or web address] - password [email or web address]  [facility]  Office  [phone]  CC: Primary care physician; System, Pcp Not In  Note: This dictation was prepared with Dragon dictation along with smaller phrase technology. Any transcriptional errors that result from this process are unintentional.

## 2017-03-01 NOTE — Procedures (Signed)
Intubation Procedure Note Alvin PeckJohn [name] [id number] 07/06/1941  Procedure: Intubation Indications: Respiratory insufficiency  Procedure Details Consent: Unable to obtain consent because of emergent medical necessity. Time Out: Verified patient identification, verified procedure, site/side was marked, verified correct patient position, special equipment/implants available, medications/allergies/relevent history reviewed, required imaging and test results available.  Performed  Drugs: 20 mg Etomidate, 50  mg Rocuronium. DL x 7.5 with # 3 blade. Grade 1 view. ET tube visualized passing through vocal cords. Following intubation:  positive color change on ETCO2, condensation seen in endotracheal tube, equal breath sounds bilaterally.  Evaluation Hemodynamic Status: BP stable throughout; O2 sats: stable throughout Patient's Current Condition: stable Complications: No apparent complications Patient did tolerate procedure well. Chest X-ray ordered to verify placement.  CXR: pending.   Bincy Varughese,AG-ACNP Pulmonary & Critical Care

## 2017-03-01 NOTE — Progress Notes (Signed)
Pt received from OR this AM, on vent at 90% FIO2, no sedation/analgesia gtt, levophed infusing, MAP goals unmet, ALine positional, UOP low.  Right pupil 4, unresponsive, left pupil 2, brisk.  CT Head ordered by Dr Ardyth Manam, and also CXR, both negative.  Per daughter who arrived later pt's rt pupil is dilated at baseline, unknown why.  Pushing fent/versed only at this time d/t Pt waking up slowly and shows s/sx distress.  Blood pressure continues low.  Levophed now infusing at 50 mcgs/min, over range dose limit.  Per Dr Ardyth Manam order neo and do not titrate levophed anymore.

## 2017-03-01 NOTE — Consult Note (Addendum)
Name: Alvin [name] MRN: [id number] DOB: 06/13/1941    ADMISSION DATE:  02/28/2017   BRIEF PATIENT DESCRIPTION: 76 year old male presenting with N/V for 3 days, now presenting with shortness of breath secondary to Pneumonia.   SIGNIFICANT EVENTS  7/25 Patient sent to the ICU  with shortness of breath secondary to Pneumonia.   7/26 To OR, ex lap without bowel resection.   STUDIES:  CT abdomen>>Findings consistent with ischemic small bowel with small bowel pneumatosis in the left lower abdomen and mesenteric gas. No freeair to suggest perforation. Diffusely dilated fluid-filled bowel without transition point suggesting ileus.   HISTORY OF PRESENT ILLNESS: Alvin [name] is a 76 year old with known history of Hypertension and Bipolar disorder. Patient is a resident of [address] [facility]. Patient presented to ED on 7/24 with increased abdominal pain for the past 3-4 days and poor oral intake. He was feeling weak due to decreased po intake and on 7/24 he became confused and short of breath.  Patient was initially placed on BiPAP but was removed due to nausea and vomiting. Patient was placed on 4L O2 and satting 93%. CXR was concerning for LLL PNA. Patient was also noted to be severely septic with WBC-23.7,LA-7.3 and hypotensive.  Patient was sent to the ICU for closer monitoring.  PAST MEDICAL HISTORY :   has a past medical history of Bipolar disorder (HCC) and Hypertension.  has no past surgical history on file. Prior to Admission medications   Medication Sig Start Date End Date Taking? Authorizing Provider  allopurinol (ZYLOPRIM) 300 MG tablet Take 300 mg by mouth daily.   Yes [provider]  atorvastatin (LIPITOR) 20 MG tablet Take 20 mg by mouth daily.   Yes [provider]  cholecalciferol (VITAMIN D) 1000 units tablet Take 1,000 Units by mouth daily.   Yes [provider]  divalproex (DEPAKOTE ER) 250 MG 24 hr tablet Take 250 mg by mouth daily.   Yes [provider]  divalproex (DEPAKOTE ER) 500 MG 24 hr tablet Take 500 mg by mouth daily.   Yes [provider]  lanolin/mineral oil (KERI/THERA-DERM) LOTN Apply 1 application topically as needed for dry skin.   Yes [provider]  lisinopril (PRINIVIL,ZESTRIL) 20 MG tablet Take 20 mg by mouth daily.   Yes [provider]  MELATONIN PO Take 3 mg by mouth at bedtime.   Yes [provider]  menthol-cetylpyridinium (CEPACOL) 3 MG lozenge Take 1 lozenge by mouth as needed for sore throat.   Yes [provider]  Multiple Vitamins-Minerals (MULTIVITAMINS THER. W/MINERALS) TABS tablet Take 1 tablet by mouth daily.   Yes [provider]  OLANZapine (ZYPREXA) 15 MG tablet Take 15 mg by mouth at bedtime.   Yes [provider]  OLANZapine (ZYPREXA) 2.5 MG tablet Take 2.5 mg by mouth every 8 (eight) hours as needed.   Yes [provider]  tamsulosin (FLOMAX) 0.4 MG CAPS capsule Take 0.4 mg by mouth daily.   Yes [provider]  thiamine (VITAMIN B-1) 100 MG tablet Take 100 mg by mouth daily.   Yes [provider]  traZODone (DESYREL) 50 MG tablet Take 50 mg by mouth at bedtime.   Yes [provider]  vitamin B-12 (CYANOCOBALAMIN) 1000 MCG tablet Take 1,000 mcg by mouth daily.   Yes [provider]   No Known Allergies  FAMILY HISTORY:  family history is not on file. SOCIAL HISTORY:  reports that he has never  smoked. He has never used smokeless tobacco. He reports that he does not drink alcohol or use drugs.  REVIEW OF SYSTEMS:   Constitutional: Negative for fever, chills, weight loss, malaise/fatigue and diaphoresis.  HENT: Negative for hearing loss, ear pain, nosebleeds, congestion, sore throat, neck pain, tinnitus and ear discharge.   Eyes: Negative for blurred vision, double vision, photophobia, pain, discharge and redness.  Respiratory: Negative for cough, hemoptysis, sputum production,  shortness of breath, wheezing and stridor.   Cardiovascular: Negative for chest pain, palpitations, orthopnea, claudication, leg swelling and PND.  Gastrointestinal: Negative for heartburn, nausea, vomiting, abdominal pain, diarrhea, constipation, blood in stool and melena.  Genitourinary: Negative for dysuria, urgency, frequency, hematuria and flank pain.  Musculoskeletal: Negative for myalgias, back pain, joint pain and falls.  Skin: Negative for itching and rash.  Neurological: Negative for dizziness, tingling, tremors, sensory change, speech change, focal weakness, seizures, loss of consciousness, weakness and headaches.  Endo/Heme/Allergies: Negative for environmental allergies and polydipsia. Does not bruise/bleed easily.  SUBJECTIVE: Patient is dyspnic and Tachypnic  VITAL SIGNS: Temp:  [98 F (36.7 C)-98.6 F (37 C)] 98.6 F (37 C) (07/25 0732) Pulse Rate:  [36-120] 60 (07/25 0732) Resp:  [15-35] 16 (07/25 0732) BP: (75-105)/(40-65) 96/65 (07/25 0732) SpO2:  [82 %-94 %] 93 % (07/25 0753) Arterial Line BP: (98)/(68) 98/68 (07/25 0732) FiO2 (%):  [90 %-100 %] 90 % (07/25 0753) Weight:  [190 lb (86.2 kg)-216 lb 14.9 oz (98.4 kg)] 216 lb 14.9 oz (98.4 kg) (07/25 0200)  PHYSICAL EXAMINATION: General: elderly, caucasian male in severe respiratory distress Neuro: unable to asess  HEENT:  AT,[place], [place] Cardiovascular: S1S2,Regular, paced Lungs: coarse throughout Abdomen:  Distended,firm,no bowel sounds Musculoskeletal:  No edema,cyanosis  Skin:  Warm,dry and intact   Recent Labs Lab 02/28/17 2158 03/01/17 0330  NA 131* 135  K 5.1 5.0  CL 93* 98*  CO2 18* 24  BUN 65* 65*  CREATININE 4.75* 3.90*  GLUCOSE 165* 170*    Recent Labs Lab 02/28/17 2158  HGB 15.2  HCT 45.1  WBC 23.7*  PLT 168   Ct Abdomen Pelvis Wo Contrast  Result Date: 03/01/2017 CLINICAL DATA:  Abdominal pain and distension.  Nausea and vomiting. EXAM: CT ABDOMEN AND PELVIS WITHOUT CONTRAST TECHNIQUE:  Multidetector CT imaging of the abdomen and pelvis was performed following the standard protocol without IV contrast. COMPARISON:  None. FINDINGS: Lower chest: Pacemaker partially included. Small left pleural effusion with adjacent basilar airspace disease, atelectasis versus pneumonia. Small right pleural effusion with adjacent atelectasis. Mild cardiomegaly. Hepatobiliary: Question of nodular hepatic contours. No evidence of focal hepatic lesion on noncontrast exam. Multiple calcified gallstones including possible stone in the gallbladder neck. No biliary dilatation. Pancreas: There is soft tissue stranding about the entire pancreas consistent with pancreatitis. Small amount peripancreatic fluid. No loculated fluid collection. No ductal dilatation. Spleen: Normal in size without focal abnormality. Minimal perisplenic fluid tracking from the pancreatic process. Adrenals/Urinary Tract: Mild bilateral adrenal thickening without discrete nodule. No hydronephrosis. Mild nonspecific perinephric edema. No urolithiasis. Urinary bladder is physiologically distended. There is mild bladder wall thickening. Small left lateral bladder diverticulum. Stomach/Bowel: There is an enteric tube with tip in the distal esophagus. Stomach distended with fluid. There is a duodenum diverticulum. Dilated fluid-filled small bowel diffusely. Small bowel pneumatosis in the left mid abdomen, image 64 series 2. Degree of dilatation diminishes distally. No discrete transition point. Normal appendix. Mild gaseous distention of colon without colonic wall thickening. Diverticulosis of the sigmoid colon without  acute inflammation. Vascular/Lymphatic: There is air in the mesenteric vessels, for example image 58 series 2. This is in the region of small bowel pneumatosis. Minimal air tracks into the proximal SMV. Aortic atherosclerosis without aneurysm. No definite adenopathy. Reproductive: Prominent prostate gland with central calcifications. Other:  Small volume abdominopelvic ascites. No free air. No loculated abscess. Musculoskeletal: Degenerative change in the spine. There are no acute or suspicious osseous abnormalities. IMPRESSION: 1. Findings consistent with ischemic small bowel with small bowel pneumatosis in the left lower abdomen and mesenteric gas. No free air to suggest perforation. Diffusely dilated fluid-filled bowel without transition point suggesting ileus. 2. Acute pancreatitis. Peripancreatic inflammation and small amount peripancreatic fluid. 3. Tip of the enteric tube in the distal esophagus, recommend advancement. 4. Nodular hepatic contours suggesting cirrhosis. 5. Cholelithiasis. 6.  Aortic Atherosclerosis (ICD10-I70.0). Critical Value/emergent results were called by telephone at the time of interpretation on 03/01/2017 at 2:28 am to NP [facility] , who verbally acknowledged these results. Electronically Signed   By: Rubye Oaks M.D.   On: 03/01/2017 02:30   Dg Abd 1 View  Result Date: 03/01/2017 CLINICAL DATA:  OG tube placement. EXAM: ABDOMEN - 1 VIEW COMPARISON:  Abdominal CT earlier this day. FINDINGS: Tip and side port of the enteric tube is been advanced, now in the stomach. Gas just distention of stomach and small bowel. No evidence of free air. IMPRESSION: Tip and side port of the enteric tube in the stomach. Electronically Signed   By: Rubye Oaks M.D.   On: 03/01/2017 03:52   Dg Chest Port 1 View  Result Date: 03/01/2017 CLINICAL DATA:  Intubation EXAM: PORTABLE CHEST 1 VIEW COMPARISON:  02/28/2017 FINDINGS: ET tube tip is 1.6 cm above the carina. Right-sided central line terminates at the expected location of the cavoatrial junction. Enteric tube extends below the diaphragm and beyond the inferior edge of the image. No pneumothorax. Persistent central and basilar opacities without significant interval change. IMPRESSION: 1. Satisfactorily positioned support equipment. 2. No significant interval change in the  bilateral airspace opacities. 3. No pneumothorax Electronically Signed   By: Ellery Plunk M.D.   On: 03/01/2017 03:53   Dg Chest Port 1 View  Result Date: 02/28/2017 CLINICAL DATA:  Vomiting for several days EXAM: PORTABLE CHEST 1 VIEW COMPARISON:  None. FINDINGS: Cardiac shadow is mildly enlarged. Pacing device is noted. Mild left basilar opacity and small effusion are seen. The right lung is clear. The bony structures are within normal limits. IMPRESSION: Left basilar opacity with associated small effusion. Electronically Signed   By: Alcide Clever M.D.   On: 02/28/2017 22:25    ASSESSMENT / PLAN:  Acute Respiratory failure secondary to PNA High Risk for Aspiration Ischemic Small Bowel High risk for Intubation Nausea/Vomitting Acute Renal Failure Hypotension Hyponatremia Hypoalbuminemia Elevated troponin related to demand ischemia Hx of Bipolar disorder Hx of BPH  Support with O2 keep O2 sats>92% Continue Vancomycin/cefipime Follow Cultures Monitor fever,CBC GI consulted(Dr. Satira Mccallum) Will Initiate pressors to Keep MAP goals>65 N/G tube to LIWS Avoid Nephrotoxic drugs CT of the abdomen to rule out bowel obstuction Trend troponins Continue flomax Continue Depakote,zyprexa Trend lactic acid Follow BMET   Bincy Varughese,AG-ACNP Pulmonary and Critical Care Medicine [facility]   03/01/2017, 8:32 AM   STAFF NOTE: I. Dr. Nicholos Johns, have personally reviewed the patient's available data including medical history , events of notes, physican examination and test results as part of my evaluation. I have discussed with the  Care with the NP  and other care providers including  pharmacist, ICU RN, RRT, dietary.  Physical Exam  Constitutional: No distress.  Eyes:  Right pupil dilated.   Neck: Neck supple.  Cardiovascular: Normal rate.   Pulmonary/Chest: Effort normal.  Abdominal: Soft.   Lungs - Decreased air entry bilaterally.   Patient is now returned  from the OR, status post open laparotomy. Per report, the patient did not require a resection of bowel. Currently he is on the ventilator, lungs are clear to auscultation. Continue ventilatory support, review of most recent blood gas shows respiratory acidosis, will increase ventilator support, patient is currently on 90% FiO2. Physical exam shows a dilated right pupil, in addition, the patient is on no sedation and is essentially unresponsive. -We will obtain a stat CT head, and a stat chest x-ray.  Wells Guileseep [place] Yeats, MD.   Board Certified in Internal Medicine, Pulmonary Medicine, Critical Care Medicine, and Sleep Medicine.  [facility] Office Number: [phone] Pager: [phone]  Santiago Gladavid Kasa, M.D.  Billy Fischeravid Simonds, M.D

## 2017-03-01 NOTE — Anesthesia Preprocedure Evaluation (Addendum)
Anesthesia Evaluation  Patient identified by MRN, date of birth, ID band  Reviewed: Allergy & Precautions, NPO status , Patient's Chart, lab work & pertinent test results  Airway Mallampati: II  TM Distance: >3 FB     Dental   Pulmonary COPD,  COPD inhaler,     + decreased breath sounds  + intubated    Cardiovascular hypertension,      Neuro/Psych PSYCHIATRIC DISORDERS Bipolar Disorder    GI/Hepatic Neg liver ROS, Possible dead bowel   Endo/Other  negative endocrine ROS  Renal/GU negative Renal ROS  negative genitourinary   Musculoskeletal negative musculoskeletal ROS (+)   Abdominal (+)  Abdomen: rigid and tender.    Peds negative pediatric ROS (+)  Hematology negative hematology ROS (+)   Anesthesia Other Findings   Reproductive/Obstetrics                             Anesthesia Physical Anesthesia Plan  ASA: IV and emergent  Anesthesia Plan: General   Post-op Pain Management:    Induction: Inhalational and Intravenous  PONV Risk Score and Plan: 3 and Ondansetron, Dexamethasone, Propofol and Midazolam  Airway Management Planned: Oral ETT  Additional Equipment: Arterial line  Intra-op Plan:   Post-operative Plan: Post-operative intubation/ventilation  Informed Consent: I have reviewed the patients History and Physical, chart, labs and discussed the procedure including the risks, benefits and alternatives for the proposed anesthesia with the patient or authorized representative who has indicated his/her understanding and acceptance.   Dental advisory given  Plan Discussed with: CRNA and Surgeon  Anesthesia Plan Comments:         Anesthesia Quick Evaluation

## 2017-03-01 NOTE — Progress Notes (Signed)
Pt arrive on floor at 0330 dx Septic Shock, on NRB at 15%, pt did not look well, was assessed by Bincy and intubated, a central line was then placed. Pt consulted by surgery due to bowel obstruction.  Pt was then seen my OR who suggested emergency surgery this am.  Pt left floor at 0530 and was stable.

## 2017-03-01 NOTE — Anesthesia Postprocedure Evaluation (Signed)
Anesthesia Post Note  Patient: Alvin PeckJohn [name]  Procedure([name]) Performed: Procedure([name]) (LRB): EXPLORATORY LAPAROTOMY (N/A)  Patient location during evaluation: PACU Anesthesia Type: General Level of consciousness: awake and alert Pain management: pain level controlled Vital Signs Assessment: post-procedure vital signs reviewed and stable Respiratory status: spontaneous breathing, nonlabored ventilation, respiratory function stable and patient connected to nasal cannula oxygen Cardiovascular status: blood pressure returned to baseline and stable Postop Assessment: no signs of nausea or vomiting Anesthetic complications: no     Last Vitals:  Vitals:   03/01/17 1100 03/01/17 1200  BP: (!) 69/50 (!) 99/57  Pulse: 60 60  Resp: (!) 29 (!) 37  Temp:  36.9 C    Last Pain:  Vitals:   03/01/17 1200  TempSrc: Axillary                 Alvin [name]

## 2017-03-01 NOTE — Consult Note (Signed)
SURGICAL CONSULTATION NOTE (initial) - cpt: 11914: [id number]  HISTORY OF PRESENT ILLNESS (HPI):  History is obtained from RN's, ICU NP, and medical record do to patient intubation and sedation.  76 y.o. male [facility] ED with N/V and diarrhea x 7 days (per pt, according to ICU RN) with most recent BM yesterday. Today, patient became acutely ill and less responsive with acutely worse SOB, concerning for aspiration, and he began also reporting abdominal pain x uncertain duration. While in ED, initial O2 sat was 85% on room air, which increased to 93% on 4L via [place]. BIPAP was attempted, but patient vomited and was intubated. He was also hypotensive on presentation, though this appears to have stabilized somewhat s/p 3L NS bolus without pressors. NG tube was inserted, which after being replaced following CT demonstrating tube in distal esophagus, was replaced with OGT by ICU RN and drained 1200 mL light green fluid. Right IJ CVC was inserted by ICU NP, and no UO has been recorded due to no Foley. CT of abdomen and pelvis was performed in the ED, after which surgery is consulted.  Patient is reportedly at baseline independent with ADL's at assisted living facility, earlier tonight denied fever/chills or CP. 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 Since that time, he last saw cardiologist 2 years ago for routine checkup and has an appointment to [facility] tomorrow.  Surgery is consulted by ICU NP Bincy Varughese in this context for evaluation and management of small bowel ischemia.  PAST MEDICAL HISTORY (PMH):  Past Medical History:  Diagnosis Date  . Bipolar disorder (HCC)   . 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 No pertinent surgical history.   MEDICATIONS:  Prior  to Admission medications   Medication Sig Start Date End Date Taking? Authorizing Provider  allopurinol (ZYLOPRIM) 300 MG tablet Take 300 mg by mouth daily.   Yes [provider]  atorvastatin (LIPITOR) 20 MG tablet Take 20 mg by mouth daily.   Yes [provider]  cholecalciferol (VITAMIN D) 1000 units tablet Take 1,000 Units by mouth daily.   Yes [provider]  divalproex (DEPAKOTE ER) 250 MG 24 hr tablet Take 250 mg by mouth daily.   Yes [provider]  divalproex (DEPAKOTE ER) 500 MG 24 hr tablet Take 500 mg by mouth daily.   Yes [provider]  lanolin/mineral oil (KERI/THERA-DERM) LOTN Apply 1 application topically as needed for dry skin.   Yes [provider]  lisinopril (PRINIVIL,ZESTRIL) 20 MG tablet Take 20 mg by mouth daily.   Yes [provider]  MELATONIN PO Take 3 mg by mouth at bedtime.   Yes [provider]  menthol-cetylpyridinium (CEPACOL) 3 MG lozenge Take 1 lozenge by mouth as needed for sore throat.   Yes [provider]  Multiple Vitamins-Minerals (MULTIVITAMINS THER. W/MINERALS) TABS tablet Take 1 tablet by mouth daily.   Yes [provider]  OLANZapine (ZYPREXA) 15 MG tablet Take 15 mg by mouth at bedtime.   Yes [provider]  OLANZapine (ZYPREXA) 2.5 MG tablet Take 2.5 mg by mouth every 8 (eight) hours as needed.   Yes [provider]  tamsulosin (FLOMAX) 0.4 MG CAPS capsule Take 0.4 mg by mouth daily.   Yes  [provider]  thiamine (VITAMIN B-1) 100 MG tablet Take 100 mg by mouth daily.   Yes [provider]  traZODone (DESYREL) 50 MG tablet Take 50 mg by mouth at bedtime.   Yes [provider]  vitamin B-12 (CYANOCOBALAMIN) 1000 MCG tablet Take 1,000 mcg by mouth daily.   Yes [provider]     ALLERGIES:  No Known Allergies   SOCIAL HISTORY:  Social History   Social History  . Marital status: Divorced    Spouse  name: N/A  . Number of children: N/A  . Years of education: N/A   Occupational History  . Not on file.   Social History Main Topics  . Smoking status: Never Smoker  . Smokeless tobacco: Never Used  . Alcohol use No  . Drug use: No  . Sexual activity: Not on file   Other Topics Concern  . Not on file   Social History Narrative  . No narrative on file    The patient currently resides (home / rehab facility / nursing home): assisted living  The patient normally is (ambulatory / bedbound): ambulatory per history in EMR provided earlier tonight   FAMILY HISTORY:  History reviewed. No pertinent family history.   REVIEW OF SYSTEMS: Unable to obtain due to intubation and sedation  VITAL SIGNS:  Temp:  [98 F (36.7 C)] 98 F (36.7 C) (07/24 2204) Pulse Rate:  [36-120] 65 (07/25 0115) Resp:  [23-35] 33 (07/25 0115) BP: (75-102)/(40-63) 75/40 (07/25 0115) SpO2:  [82 %-93 %] 82 % (07/25 0115) Weight:  [190 lb (86.2 kg)] 190 lb (86.2 kg) (07/24 2203)     Height: 5\' 8"  (172.7 cm) Weight: 190 lb (86.2 kg)     INTAKE/OUTPUT:  This shift: Total I/O In: 2200 [IV Piggyback:2200] Out: 100 [Urine:100]  Last 2 shifts: @IOLAST2SHIFTS @   PHYSICAL EXAM:  Constitutional:  -- Normal body habitus  -- Intubated and sedated, resting comfortably  Eyes:  -- No scleral icterus  Ear, nose, and throat:  -- No jugular venous distension Pulmonary:  -- Diffusely coarse -- Equal breath sounds bilaterally -- Intubated with ventilatory support Cardiovascular:  -- S1, S2 present  -- No pericardial rubs Gastrointestinal:  -- Abdomen soft, appears distended, no indication of tenderness with palpation but exam limited due to current sedation  -- No abdominal masses appreciated, pulsatile or otherwise, and no prior abdominal surgical scars Musculoskeletal and Integumentary:  -- Wounds or skin discoloration: None appreciated -- Extremities: Hands and feet warm, no edema  Neurologic:  -- Motor  function: unable to assess due to sedation -- Sensation: unable to assess due to sedation  Labs:  CBC Latest Ref Rng & Units 02/28/2017  WBC 3.8 - 10.6 K/uL 23.7(H)  Hemoglobin 13.0 - 18.0 g/dL 19.115.2  Hematocrit 47.840.0 - 52.0 % 45.1  Platelets 150 - 440 K/uL 168   CMP Latest Ref Rng & Units 02/28/2017  Glucose 65 - 99 mg/dL 295(A165(H)  BUN 6 - 20 mg/dL 21(H65(H)  Creatinine 0.860.61 - 1.24 mg/dL 5.78(I4.75(H)  Sodium 696135 - 295145 mmol/L 131(L)  Potassium 3.5 - 5.1 mmol/L 5.1  Chloride 101 - 111 mmol/L 93(L)  CO2 22 - 32 mmol/L 18(L)  Calcium 8.9 - 10.3 mg/dL 2.8(U8.8(L)  Total Protein 6.5 - 8.1 g/dL 6.3(L)  Total Bilirubin 0.3 - 1.2 mg/dL 0.9  Alkaline Phos 38 - 126 U/L 65  AST 15 - 41 U/L 65(H)  ALT 17 - 63 U/L 34   Imaging studies:  CT  Abdomen and Pelvis Without Contrast (02/28/2017) - personally reviewed and discussed with patient's daughter by phone Lower chest: Pacemaker partially included. Small left pleural effusion with adjacent basilar airspace disease, atelectasis versus pneumonia. Small right pleural effusion with adjacent atelectasis. Mild cardiomegaly.  Stomach/Bowel: There is an enteric tube with tip in the distal esophagus. Stomach distended with fluid. There is a duodenum diverticulum. Dilated fluid-filled small bowel diffusely. Small bowel pneumatosis in the left mid abdomen, image 64 series 2.  Degree of dilatation diminishes distally. No discrete transition point. Normal appendix. Mild gaseous distention of colon without colonic wall thickening. Diverticulosis of the sigmoid colon without acute Inflammation.  Vascular/Lymphatic: There is air in the mesenteric vessels, for example image 58 series 2. This is in the region of small bowel pneumatosis. Minimal air tracks into the proximal SMV. Aortic atherosclerosis without aneurysm. No definite adenopathy.  Pancreas: There is soft tissue stranding about the entire pancreas consistent with pancreatitis. Small amount peripancreatic fluid.  No loculated fluid collection. No ductal dilatation.  Assessment/Plan: (ICD-10's: K55.069, R65.21, J69.0) 76 y.o. male with septic shock attributed to small bowel ischemia and aspiration pneumonia following 7 days of N/V and diarrhea, complicated by AKI and pertinent comorbidities including HTN, CAD (told at time of pacemaker 7 years ago he'd had prior MI), BPH on Flomax, and bipolar disorder -- overall critical condition with multi-organ systems failure and guarded prognosis.   - supportive ICU care  - continue nasogastric decompression  - antibiotics including Zosyn for small bowel ischemia + aspiration pneumonia with septic shock   - all risks, benefits, and alternatives to exploratory laparotomy with likely small bowel resection and possible temporary abdominal closure with follow-up additional surgery, as well as gravity of patient's condition, were discussed with patient's daughter/POA Legrand Como) by phone, all of her questions were answered to her expressed satisfaction, she expresses her wishes to proceed with surgery, and informed consent was obtained.  - awaiting OR for emergent exploratory laparotomy with likely small bowel resection, possible temporary abdominal closure  - DVT prophylaxis  All of the above findings and recommendations were discussed with the patient's daughter/POA Legrand Como), and all of patient's daughter's questions were answered to her expressed satisfaction.  Thank you for the opportunity to participate in this patient's care.   -- Scherrie Gerlach Earlene Plater, MD, RPVI [place]: [facility] - Partnering for exceptional care. Office: [phone]

## 2017-03-01 NOTE — Progress Notes (Addendum)
Pharmacy Antibiotic Note  Alvin PeckJohn Wenzlick is a 76 y.o. male admitted on 02/28/2017 with sepsis.  Pharmacy has been consulted for vancomycin and cefepime dosing.  Plan: DW 86kg  Vd 60L kei 0.016 hr-1  T1/2 43 hours Vancomycin 1 gram q 48 hours ordered with stacked dosing. Level before 4th dose. Goal trough 15-20.  Cefepime 2 grams q 24 hours ordered. Changed to Zosyn per surgery.  Height: 5\' 8"  (172.7 cm) Weight: 190 lb (86.2 kg) IBW/kg (Calculated) : 68.4  Temp (24hrs), Avg:98 F (36.7 C), Min:98 F (36.7 C), Max:98 F (36.7 C)   Recent Labs Lab 02/28/17 2158  WBC 23.7*  CREATININE 4.75*  LATICACIDVEN 7.3*    Estimated Creatinine Clearance: 14.3 mL/min (A) (by C-G formula based on SCr of 4.75 mg/dL (H)).    No Known Allergies  Antimicrobials this admission: Vancomycin cefepime 7/24  >>    >>   Dose adjustments this admission:   Microbiology results: 7/24 BCx: pending 7/24 UCx: pending  7/25 Sputum: pending  7/25 MRSA PCR: pending      7/24 UA: (-) 7/24 CXR: L base opacity Thank you for allowing pharmacy to be a part of this patient's care.  Adyn Serna S 03/01/2017 3:00 AM

## 2017-03-01 NOTE — Transfer of Care (Signed)
Immediate Anesthesia Transfer of Care Note  Patient: Lorenda PeckJohn Schunk  Procedure(s) Performed: Procedure(s): EXPLORATORY LAPAROTOMY (N/A)  Patient Location: ICU  Anesthesia Type:General  Level of Consciousness: sedated  Airway & Oxygen Therapy: Patient remains intubated per anesthesia plan  Post-op Assessment: Report given to RN and Post -op Vital signs reviewed and stable  Post vital signs: Reviewed and stable  Last Vitals:  Vitals:   03/01/17 0500 03/01/17 0732  BP: (!) 93/50 96/65  Pulse: 60 60  Resp: (!) 32 16  Temp:  37 C    Last Pain:  Vitals:   03/01/17 0340  TempSrc: Axillary         Complications: No apparent anesthesia complications

## 2017-03-01 NOTE — Progress Notes (Signed)
Placed pt on Bipap, but pt was unable to keep on due to nausea and vomiting. MD at bedside and aware..Marland Kitchen

## 2017-03-01 NOTE — Progress Notes (Signed)
Per NP Annabelle Harmanana we will start vasopressin d/t unable to meet MAP goal with large dose levo and Neo  Also have ordered Q4H CBGs

## 2017-03-01 NOTE — Consult Note (Signed)
Name: Alvin PeckJohn [name] MRN: [id number] DOB: 05/13/1941    ADMISSION DATE:  02/28/2017 CONSULTATION DATE:  03/01/17  REFERRING MD : Dr. Dahlia BailiffHugelmyer  CHIEF COMPLAINT:  Shortness of breath  BRIEF PATIENT DESCRIPTION: 76 year old male presenting with N/V for 3 days, now presenting with shortness of breath secondary to Pneumonia.   SIGNIFICANT EVENTS  7/25 Patient sent to the ICU  with shortness of breath secondary to Pneumonia.    STUDIES:  CT abdomen>>Findings consistent with ischemic small bowel with small bowel pneumatosis in the left lower abdomen and mesenteric gas. No freeair to suggest perforation. Diffusely dilated fluid-filled bowel without transition point suggesting ileus.   HISTORY OF PRESENT ILLNESS: Alvin PeckJohn [name] is a 76 year old with known history of Hypertension and Bipolar disorder. Patient is a resident [address] [facility]. Patient presented to ED on 7/24 with increased abdominal pain for the past 3-4 days and poor oral intake. He was feeling weak due to decreased po intake and on 7/24 he became confused and short of breath.  Patient was initially placed on BiPAP but was removed due to nausea and vomiting. Patient was placed on 4L O2 and satting 93%. CXR was concerning for LLL PNA. Patient was also noted to be severely septic with WBC-23.7,LA-7.3 and hypotensive.  Patient was sent to the ICU for closer monitoring.  PAST MEDICAL HISTORY :   has a past medical history of Bipolar disorder (HCC) and Hypertension.  has no past surgical history on file. Prior to Admission medications   Medication Sig Start Date End Date Taking? Authorizing Provider  allopurinol (ZYLOPRIM) 300 MG tablet Take 300 mg by mouth daily.   Yes [provider]  atorvastatin (LIPITOR) 20 MG tablet Take 20 mg by mouth daily.   Yes [provider]  cholecalciferol (VITAMIN D) 1000 units tablet Take 1,000 Units by mouth daily.   Yes [provider]  divalproex (DEPAKOTE ER) 250 MG 24  hr tablet Take 250 mg by mouth daily.   Yes [provider]  divalproex (DEPAKOTE ER) 500 MG 24 hr tablet Take 500 mg by mouth daily.   Yes [provider]  lanolin/mineral oil (KERI/THERA-DERM) LOTN Apply 1 application topically as needed for dry skin.   Yes [provider]  lisinopril (PRINIVIL,ZESTRIL) 20 MG tablet Take 20 mg by mouth daily.   Yes [provider]  MELATONIN PO Take 3 mg by mouth at bedtime.   Yes [provider]  menthol-cetylpyridinium (CEPACOL) 3 MG lozenge Take 1 lozenge by mouth as needed for sore throat.   Yes [provider]  Multiple Vitamins-Minerals (MULTIVITAMINS THER. W/MINERALS) TABS tablet Take 1 tablet by mouth daily.   Yes [provider]  OLANZapine (ZYPREXA) 15 MG tablet Take 15 mg by mouth at bedtime.   Yes [provider]  OLANZapine (ZYPREXA) 2.5 MG tablet Take 2.5 mg by mouth every 8 (eight) hours as needed.   Yes [provider]  tamsulosin (FLOMAX) 0.4 MG CAPS capsule Take 0.4 mg by mouth daily.   Yes [provider]  thiamine (VITAMIN B-1) 100 MG tablet Take 100 mg by mouth daily.   Yes [provider]  traZODone (DESYREL) 50 MG tablet Take 50 mg by mouth at bedtime.   Yes [provider]  vitamin B-12 (CYANOCOBALAMIN) 1000 MCG tablet Take 1,000 mcg by mouth daily.   Yes [provider]   No Known Allergies  FAMILY HISTORY:  family history is not on file. SOCIAL  HISTORY:  reports that he has never smoked. He has never used smokeless tobacco. He reports that he does not drink alcohol or use drugs.  REVIEW OF SYSTEMS:   Constitutional: Negative for fever, chills, weight loss, malaise/fatigue and diaphoresis.  HENT: Negative for hearing loss, ear pain, nosebleeds, congestion, sore throat, neck pain, tinnitus and ear discharge.   Eyes: Negative for blurred vision, double vision, photophobia, pain, discharge and redness.  Respiratory:  Negative for cough, hemoptysis, sputum production, shortness of breath, wheezing and stridor.   Cardiovascular: Negative for chest pain, palpitations, orthopnea, claudication, leg swelling and PND.  Gastrointestinal: Negative for heartburn, nausea, vomiting, abdominal pain, diarrhea, constipation, blood in stool and melena.  Genitourinary: Negative for dysuria, urgency, frequency, hematuria and flank pain.  Musculoskeletal: Negative for myalgias, back pain, joint pain and falls.  Skin: Negative for itching and rash.  Neurological: Negative for dizziness, tingling, tremors, sensory change, speech change, focal weakness, seizures, loss of consciousness, weakness and headaches.  Endo/Heme/Allergies: Negative for environmental allergies and polydipsia. Does not bruise/bleed easily.  SUBJECTIVE: Patient is dyspnic and Tachypnic  VITAL SIGNS: Temp:  [98 F (36.7 C)] 98 F (36.7 C) (07/24 2204) Pulse Rate:  [59-120] 61 (07/25 0030) Resp:  [23-33] 30 (07/25 0030) BP: (77-102)/(45-63) 82/55 (07/25 0030) SpO2:  [87 %-93 %] 90 % (07/25 0030) Weight:  [86.2 kg (190 lb)] 86.2 kg (190 lb) (07/24 2203)  PHYSICAL EXAMINATION: General: elderly, caucasian male in severe respiratory distress Neuro: unable to asess  HEENT:  AT,[place], [place] Cardiovascular: S1S2,Regular, paced Lungs: coarse throughout Abdomen:  Distended,firm,no bowel sounds Musculoskeletal:  No edema,cyanosis  Skin:  Warm,dry and intact   Recent Labs Lab 02/28/17 2158  NA 131*  K 5.1  CL 93*  CO2 18*  BUN 65*  CREATININE 4.75*  GLUCOSE 165*    Recent Labs Lab 02/28/17 2158  HGB 15.2  HCT 45.1  WBC 23.7*  PLT 168   Dg Chest Port 1 View  Result Date: 02/28/2017 CLINICAL DATA:  Vomiting for several days EXAM: PORTABLE CHEST 1 VIEW COMPARISON:  None. FINDINGS: Cardiac shadow is mildly enlarged. Pacing device is noted. Mild left basilar opacity and small effusion are seen. The right lung is clear. The bony structures are  within normal limits. IMPRESSION: Left basilar opacity with associated small effusion. Electronically Signed   By: Alcide Clever M.D.   On: 02/28/2017 22:25    ASSESSMENT / PLAN:  Acute Respiratory failure secondary to PNA High Risk for Aspiration Ischemic Small Bowel High risk for Intubation Nausea/Vomitting Acute Renal Failure Hypotension Hyponatremia Hypoalbuminemia Elevated troponin related to demand ischemia Hx of Bipolar disorder Hx of BPH  Support with O2 keep O2 sats>92% Continue Vancomycin/cefipime Follow Cultures Monitor fever,CBC GI consulted(Dr. Satira Mccallum) Will Initiate pressors to Keep MAP goals>65 N/G tube to LIWS Avoid Nephrotoxic drugs CT of the abdomen to rule out bowel obstuction Trend troponins Continue flomax Continue Depakote,zyprexa Trend lactic acid Follow BMET   Bincy Varughese,AG-ACNP Pulmonary and Critical Care Medicine [facility]   03/01/2017, 1:02 AM   STAFF NOTE: I. Dr. Nicholos Johns, have personally reviewed the patient's available data including medical history , events of notes, physican examination and test results as part of my evaluation. I have discussed with the  Care with the NP and other care providers including  pharmacist, ICU RN, RRT, dietary.  Physical Exam Lungs - Decreased air entry bilaterally.   Patient is not returned from the OR, status post open laparotomy. Per report, the patient did not  require a resection of bowel. Currently he is on the ventilator, lungs are clear to auscultation. Continue ventilatory support, sooner extubation once patient is more awake with weaning of sedation.  Wells Guileseep Ilaisaane Marts, MD.   Board Certified in Internal Medicine, Pulmonary Medicine, Critical Care Medicine, and Sleep Medicine.  [place] [facility] Office Number: [phone] Pager: [phone]  Santiago Gladavid Kasa, M.D.  Billy Fischeravid Simonds, M.D

## 2017-03-02 DIAGNOSIS — K55069 Acute infarction of intestine, part and extent unspecified: Secondary | ICD-10-CM

## 2017-03-02 LAB — GLUCOSE, CAPILLARY
GLUCOSE-CAPILLARY: 151 mg/dL — AB (ref 65–99)
GLUCOSE-CAPILLARY: 163 mg/dL — AB (ref 65–99)
Glucose-Capillary: 141 mg/dL — ABNORMAL HIGH (ref 65–99)
Glucose-Capillary: 156 mg/dL — ABNORMAL HIGH (ref 65–99)
Glucose-Capillary: 160 mg/dL — ABNORMAL HIGH (ref 65–99)
Glucose-Capillary: 160 mg/dL — ABNORMAL HIGH (ref 65–99)

## 2017-03-02 LAB — BLOOD CULTURE ID PANEL (REFLEXED)
Acinetobacter baumannii: NOT DETECTED
CANDIDA ALBICANS: NOT DETECTED
CANDIDA GLABRATA: NOT DETECTED
CANDIDA TROPICALIS: NOT DETECTED
Candida krusei: NOT DETECTED
Candida parapsilosis: NOT DETECTED
ENTEROBACTER CLOACAE COMPLEX: NOT DETECTED
ENTEROBACTERIACEAE SPECIES: NOT DETECTED
Enterococcus species: NOT DETECTED
Escherichia coli: NOT DETECTED
Haemophilus influenzae: NOT DETECTED
KLEBSIELLA PNEUMONIAE: NOT DETECTED
Klebsiella oxytoca: NOT DETECTED
LISTERIA MONOCYTOGENES: NOT DETECTED
Methicillin resistance: NOT DETECTED
NEISSERIA MENINGITIDIS: NOT DETECTED
Proteus species: NOT DETECTED
Pseudomonas aeruginosa: NOT DETECTED
STREPTOCOCCUS AGALACTIAE: NOT DETECTED
STREPTOCOCCUS PNEUMONIAE: NOT DETECTED
STREPTOCOCCUS PYOGENES: NOT DETECTED
STREPTOCOCCUS SPECIES: NOT DETECTED
Serratia marcescens: NOT DETECTED
Staphylococcus aureus (BCID): NOT DETECTED
Staphylococcus species: DETECTED — AB

## 2017-03-02 LAB — LACTIC ACID, PLASMA: LACTIC ACID, VENOUS: 4.7 mmol/L — AB (ref 0.5–1.9)

## 2017-03-02 LAB — URINE CULTURE: CULTURE: NO GROWTH

## 2017-03-02 LAB — CBC
HCT: 38.4 % — ABNORMAL LOW (ref 40.0–52.0)
Hemoglobin: 13 g/dL (ref 13.0–18.0)
MCH: 33.6 pg (ref 26.0–34.0)
MCHC: 33.8 g/dL (ref 32.0–36.0)
MCV: 99.2 fL (ref 80.0–100.0)
PLATELETS: 149 10*3/uL — AB (ref 150–440)
RBC: 3.87 MIL/uL — ABNORMAL LOW (ref 4.40–5.90)
RDW: 14.4 % (ref 11.5–14.5)
WBC: 24.7 10*3/uL — ABNORMAL HIGH (ref 3.8–10.6)

## 2017-03-02 LAB — COMPREHENSIVE METABOLIC PANEL
ALBUMIN: 1.9 g/dL — AB (ref 3.5–5.0)
ALK PHOS: 45 U/L (ref 38–126)
ALT: 32 U/L (ref 17–63)
AST: 62 U/L — AB (ref 15–41)
Anion gap: 11 (ref 5–15)
BUN: 62 mg/dL — AB (ref 6–20)
CALCIUM: 6.8 mg/dL — AB (ref 8.9–10.3)
CO2: 17 mmol/L — AB (ref 22–32)
CREATININE: 3.03 mg/dL — AB (ref 0.61–1.24)
Chloride: 108 mmol/L (ref 101–111)
GFR calc non Af Amer: 19 mL/min — ABNORMAL LOW (ref >60)
GFR, EST AFRICAN AMERICAN: 22 mL/min — AB (ref >60)
GLUCOSE: 179 mg/dL — AB (ref 65–99)
Potassium: 4.6 mmol/L (ref 3.5–5.1)
SODIUM: 136 mmol/L (ref 135–145)
Total Bilirubin: 0.7 mg/dL (ref 0.3–1.2)
Total Protein: 4.5 g/dL — ABNORMAL LOW (ref 6.5–8.1)

## 2017-03-02 LAB — PROCALCITONIN: PROCALCITONIN: 13.81 ng/mL

## 2017-03-02 MED ORDER — PIPERACILLIN-TAZOBACTAM 3.375 G IVPB
3.3750 g | Freq: Three times a day (TID) | INTRAVENOUS | Status: AC
  Administered 2017-03-02 – 2017-03-07 (×17): 3.375 g via INTRAVENOUS
  Filled 2017-03-02 (×17): qty 50

## 2017-03-02 MED ORDER — HEPARIN SODIUM (PORCINE) 5000 UNIT/ML IJ SOLN
5000.0000 [IU] | Freq: Three times a day (TID) | INTRAMUSCULAR | Status: DC
  Administered 2017-03-02 – 2017-03-17 (×45): 5000 [IU] via SUBCUTANEOUS
  Filled 2017-03-02 (×45): qty 1

## 2017-03-02 MED ORDER — HYDROCORTISONE NA SUCCINATE PF 100 MG IJ SOLR
50.0000 mg | Freq: Three times a day (TID) | INTRAMUSCULAR | Status: DC
Start: 2017-03-02 — End: 2017-03-03
  Administered 2017-03-02 – 2017-03-03 (×4): 50 mg via INTRAVENOUS
  Filled 2017-03-02 (×4): qty 2

## 2017-03-02 MED ORDER — SODIUM CHLORIDE 0.9 % IV SOLN
1.0000 g | Freq: Once | INTRAVENOUS | Status: AC
  Administered 2017-03-02: 1 g via INTRAVENOUS
  Filled 2017-03-02: qty 10

## 2017-03-02 MED ORDER — INSULIN ASPART 100 UNIT/ML ~~LOC~~ SOLN
0.0000 [IU] | SUBCUTANEOUS | Status: DC
  Administered 2017-03-02: 1 [IU] via SUBCUTANEOUS
  Administered 2017-03-02 (×3): 2 [IU] via SUBCUTANEOUS
  Administered 2017-03-03: 1 [IU] via SUBCUTANEOUS
  Administered 2017-03-03: 2 [IU] via SUBCUTANEOUS
  Administered 2017-03-03 (×2): 1 [IU] via SUBCUTANEOUS
  Administered 2017-03-03: 2 [IU] via SUBCUTANEOUS
  Administered 2017-03-04: 1 [IU] via SUBCUTANEOUS
  Administered 2017-03-04: 3 [IU] via SUBCUTANEOUS
  Administered 2017-03-04: 1 [IU] via SUBCUTANEOUS
  Administered 2017-03-04: 2 [IU] via SUBCUTANEOUS
  Administered 2017-03-05: 1 [IU] via SUBCUTANEOUS
  Administered 2017-03-05: 2 [IU] via SUBCUTANEOUS
  Administered 2017-03-05 (×2): 1 [IU] via SUBCUTANEOUS
  Administered 2017-03-05 (×2): 2 [IU] via SUBCUTANEOUS
  Administered 2017-03-05: 1 [IU] via SUBCUTANEOUS
  Administered 2017-03-06 – 2017-03-07 (×6): 2 [IU] via SUBCUTANEOUS
  Administered 2017-03-07: 04:00:00 3 [IU] via SUBCUTANEOUS
  Administered 2017-03-07 – 2017-03-08 (×5): 2 [IU] via SUBCUTANEOUS
  Administered 2017-03-08: 12:00:00 3 [IU] via SUBCUTANEOUS
  Administered 2017-03-08 (×2): 1 [IU] via SUBCUTANEOUS
  Administered 2017-03-08: 18:00:00 2 [IU] via SUBCUTANEOUS
  Administered 2017-03-08 – 2017-03-09 (×4): 1 [IU] via SUBCUTANEOUS
  Filled 2017-03-02 (×40): qty 1

## 2017-03-02 NOTE — Progress Notes (Signed)
PHARMACY - PHYSICIAN COMMUNICATION CRITICAL VALUE ALERT - BLOOD CULTURE IDENTIFICATION (BCID)  Results for orders placed or performed during the hospital encounter of 02/28/17  Blood Culture ID Panel (Reflexed) (Collected: 02/28/2017  9:58 PM)  Result Value Ref Range   Enterococcus species NOT DETECTED NOT DETECTED   Listeria monocytogenes NOT DETECTED NOT DETECTED   Staphylococcus species DETECTED (A) NOT DETECTED   Staphylococcus aureus NOT DETECTED NOT DETECTED   Methicillin resistance NOT DETECTED NOT DETECTED   Streptococcus species NOT DETECTED NOT DETECTED   Streptococcus agalactiae NOT DETECTED NOT DETECTED   Streptococcus pneumoniae NOT DETECTED NOT DETECTED   Streptococcus pyogenes NOT DETECTED NOT DETECTED   Acinetobacter baumannii NOT DETECTED NOT DETECTED   Enterobacteriaceae species NOT DETECTED NOT DETECTED   Enterobacter cloacae complex NOT DETECTED NOT DETECTED   Escherichia coli NOT DETECTED NOT DETECTED   Klebsiella oxytoca NOT DETECTED NOT DETECTED   Klebsiella pneumoniae NOT DETECTED NOT DETECTED   Proteus species NOT DETECTED NOT DETECTED   Serratia marcescens NOT DETECTED NOT DETECTED   Haemophilus influenzae NOT DETECTED NOT DETECTED   Neisseria meningitidis NOT DETECTED NOT DETECTED   Pseudomonas aeruginosa NOT DETECTED NOT DETECTED   Candida albicans NOT DETECTED NOT DETECTED   Candida glabrata NOT DETECTED NOT DETECTED   Candida krusei NOT DETECTED NOT DETECTED   Candida parapsilosis NOT DETECTED NOT DETECTED   Candida tropicalis NOT DETECTED NOT DETECTED    Name of physician (or Provider) Contacted: Dr. Sung AmabileSimonds  Changes to prescribed antibiotics required: No change in therapy at this time. Will continue Zosyn.   Gardner CandleSheema M Mackensi Mahadeo, PharmD, BCPS Clinical Pharmacist 03/02/2017 2:37 PM

## 2017-03-02 NOTE — Progress Notes (Signed)
[employer] [facility] [place] [facility]   PATIENT NAME: Alvin PeckJohn [name]    MR#:  [id number]  DATE OF BIRTH:  01/18/1941  SUBJECTIVE:  CHIEF COMPLAINT:   Chief Complaint  Patient presents with  . Shortness of Breath     Came with septic shock due to small bowel ischemia, on vent and vasopressors support.   REVIEW OF SYSTEMS:  Intubated on sedation, can not give ROS.  ROS  DRUG ALLERGIES:  No Known Allergies  VITALS:  Blood pressure (!) 121/57, pulse 72, temperature 98.3 F (36.8 C), resp. rate (!) 22, height 5\' 8"  (1.727 m), weight 98.4 kg (216 lb 14.9 oz), SpO2 96 %.  PHYSICAL EXAMINATION:  GENERAL:  76 y.o.-year-old patient lying in the bed with critical appearance.  EYES: Pupils equal, round, reactive to light and accommodation. No scleral icterus.  HEENT: Head atraumatic, normocephalic. Oropharynx and nasopharynx clear.  NECK:  Supple, no jugular venous distention. No thyroid enlargement, no tenderness. ETT in place. LUNGS: Normal breath sounds bilaterally, no wheezing, rales,rhonchi or crepitation. No use of accessory muscles of respiration. On vent support. CARDIOVASCULAR: S1, S2 normal. No murmurs, rubs, or gallops.  ABDOMEN: Soft, nontender, some distended. Bowel sounds sluggish. No organomegaly or mass.  EXTREMITIES: No pedal edema, cyanosis, or clubbing.  NEUROLOGIC: sedated with ventilatory support. PSYCHIATRIC: The patient is sedated.  SKIN: No obvious rash, lesion, or ulcer.   Physical Exam LABORATORY PANEL:   CBC  Recent Labs Lab 03/02/17 0500  WBC 24.7*  HGB 13.0  HCT 38.4*  PLT 149*   ------------------------------------------------------------------------------------------------------------------  Chemistries   Recent Labs Lab 03/01/17 0801  03/02/17 0500  NA 134*  < > 136  K 4.7  < > 4.6  CL 101  < > 108  CO2 24  < > 17*  GLUCOSE 164*  < > 179*  BUN 62*  < > 62*  CREATININE 3.40*  < > 3.03*  CALCIUM 7.5*  < > 6.8*  MG 1.9   --   --   AST  --   --  62*  ALT  --   --  32  ALKPHOS  --   --  45  BILITOT  --   --  0.7  < > = values in this interval not displayed. ------------------------------------------------------------------------------------------------------------------  Cardiac Enzymes  Recent Labs Lab 03/01/17 0801 03/01/17 1356  TROPONINI 0.09* 0.09*   ------------------------------------------------------------------------------------------------------------------  RADIOLOGY:  Ct Abdomen Pelvis Wo Contrast  Result Date: 03/01/2017 CLINICAL DATA:  Abdominal pain and distension.  Nausea and vomiting. EXAM: CT ABDOMEN AND PELVIS WITHOUT CONTRAST TECHNIQUE: Multidetector CT imaging of the abdomen and pelvis was performed following the standard protocol without IV contrast. COMPARISON:  None. FINDINGS: Lower chest: Pacemaker partially included. Small left pleural effusion with adjacent basilar airspace disease, atelectasis versus pneumonia. Small right pleural effusion with adjacent atelectasis. Mild cardiomegaly. Hepatobiliary: Question of nodular hepatic contours. No evidence of focal hepatic lesion on noncontrast exam. Multiple calcified gallstones including possible stone in the gallbladder neck. No biliary dilatation. Pancreas: There is soft tissue stranding about the entire pancreas consistent with pancreatitis. Small amount peripancreatic fluid. No loculated fluid collection. No ductal dilatation. Spleen: Normal in size without focal abnormality. Minimal perisplenic fluid tracking from the pancreatic process. Adrenals/Urinary Tract: Mild bilateral adrenal thickening without discrete nodule. No hydronephrosis. Mild nonspecific perinephric edema. No urolithiasis. Urinary bladder is physiologically distended. There is mild bladder wall thickening. Small left lateral bladder diverticulum. Stomach/Bowel: There is an enteric tube with tip in  the distal esophagus. Stomach distended with fluid. There is a duodenum  diverticulum. Dilated fluid-filled small bowel diffusely. Small bowel pneumatosis in the left mid abdomen, image 64 series 2. Degree of dilatation diminishes distally. No discrete transition point. Normal appendix. Mild gaseous distention of colon without colonic wall thickening. Diverticulosis of the sigmoid colon without acute inflammation. Vascular/Lymphatic: There is air in the mesenteric vessels, for example image 58 series 2. This is in the region of small bowel pneumatosis. Minimal air tracks into the proximal SMV. Aortic atherosclerosis without aneurysm. No definite adenopathy. Reproductive: Prominent prostate gland with central calcifications. Other: Small volume abdominopelvic ascites. No free air. No loculated abscess. Musculoskeletal: Degenerative change in the spine. There are no acute or suspicious osseous abnormalities. IMPRESSION: 1. Findings consistent with ischemic small bowel with small bowel pneumatosis in the left lower abdomen and mesenteric gas. No free air to suggest perforation. Diffusely dilated fluid-filled bowel without transition point suggesting ileus. 2. Acute pancreatitis. Peripancreatic inflammation and small amount peripancreatic fluid. 3. Tip of the enteric tube in the distal esophagus, recommend advancement. 4. Nodular hepatic contours suggesting cirrhosis. 5. Cholelithiasis. 6.  Aortic Atherosclerosis (ICD10-I70.0). Critical Value/emergent results were called by telephone at the time of interpretation on 03/01/2017 at 2:28 am to NP [facility] , who verbally acknowledged these results. Electronically Signed   By: Rubye Oaks M.D.   On: 03/01/2017 02:30   Dg Abd 1 View  Result Date: 03/01/2017 CLINICAL DATA:  OG tube placement. EXAM: ABDOMEN - 1 VIEW COMPARISON:  Abdominal CT earlier this day. FINDINGS: Tip and side port of the enteric tube is been advanced, now in the stomach. Gas just distention of stomach and small bowel. No evidence of free air. IMPRESSION: Tip and side  port of the enteric tube in the stomach. Electronically Signed   By: Rubye Oaks M.D.   On: 03/01/2017 03:52   Ct Head Wo Contrast  Result Date: 03/01/2017 CLINICAL DATA:  Altered mental status. EXAM: CT HEAD WITHOUT CONTRAST TECHNIQUE: Contiguous axial images were obtained from the base of the skull through the vertex without intravenous contrast. COMPARISON:  None. FINDINGS: Brain: No evidence of acute infarction, hemorrhage, hydrocephalus, extra-axial collection or mass lesion/mass effect. Age-related cerebral atrophy with compensatory dilatation of the ventricles. Vascular: No hyperdense vessel or unexpected calcification. Skull: Normal. Negative for fracture or focal lesion. Sinuses/Orbits: Mild mucosal thickening of the left maxillary sinus. The remaining paranasal sinuses and bilateral mastoid air cells are clear. The orbits are unremarkable. Other: None. IMPRESSION: No acute intracranial abnormality. Electronically Signed   By: Obie Dredge M.D.   On: 03/01/2017 09:52   Dg Chest Port 1 View  Result Date: 03/01/2017 CLINICAL DATA:  Altered level of consciousness EXAM: PORTABLE CHEST 1 VIEW COMPARISON:  03/01/2017 FINDINGS: Support devices are stable. Low lung volumes with bibasilar atelectasis and mild vascular congestion. Mild cardiomegaly. Possible small layering effusions. IMPRESSION: Low lung volumes with bibasilar atelectasis and small layering effusions. Mild vascular congestion. Electronically Signed   By: Charlett Nose M.D.   On: 03/01/2017 09:18   Dg Chest Port 1 View  Result Date: 03/01/2017 CLINICAL DATA:  Intubation EXAM: PORTABLE CHEST 1 VIEW COMPARISON:  02/28/2017 FINDINGS: ET tube tip is 1.6 cm above the carina. Right-sided central line terminates at the expected location of the cavoatrial junction. Enteric tube extends below the diaphragm and beyond the inferior edge of the image. No pneumothorax. Persistent central and basilar opacities without significant interval change.  IMPRESSION: 1. Satisfactorily positioned support equipment. 2.  No significant interval change in the bilateral airspace opacities. 3. No pneumothorax Electronically Signed   By: Ellery Plunkaniel R Mitchell M.D.   On: 03/01/2017 03:53    ASSESSMENT AND PLAN:   Active Problems:   Severe sepsis with septic shock (HCC)   Acute respiratory failure with hypoxemia (HCC)   Abdominal distention   Lactic acidosis   Elevated lipase   Nonocclusive intestinal infarction (HCC)  #. Severe sepsis with septic shock and respi failure secondary to pneumonia. - intubated, vent support, needing vasopressors. - IV antibiotics: Maxipime, Vanco - IV fluid hydration - Trend lactate - Follow up blood,urine & sputum cultures - Repeat CBC in am.   #. Acute respiratory failure with hypoxia - vent support.  #. Abdominal distention rule out bowel obstruction- ileus and ischemic bowel. - NPO - surgical consultation  #. Renal insufficiency, acute due to shock - IV fluids and repeat BMP in AM.  - Avoid nephrotoxic medications  #. Elevated troponin, likely secondary to demand ischemia - Trend troponins - Monitor on telemetry  #. Elevated lipase - Follow up   #. History of BPH  #. History of Bipolar disorder  # bowel ischemia   laprotomy was done 03/01/17, but ischemia resolved immediately ( after removing intestinal loops from incision), so no resection done.   Monitor.  All the records are reviewed and case discussed with Care Management/Social Workerr. Management plans discussed with the patient, family and they are in agreement.  CODE STATUS: Full.  TOTAL TIME TAKING CARE OF THIS PATIENT: 35 minutes.   Prognosis very poor.  POSSIBLE D/C IN 2-3 DAYS, DEPENDING ON CLINICAL CONDITION.   Altamese DillingVACHHANI, Johnchristopher Sarvis M.D on 03/02/2017   Between 7am to 6pm - Pager - [phone]  After 6pm go to [email or web address] - password [email or web address]  [facility]  Office  [phone]  CC: Primary  care physician; System, Pcp Not In  Note: This dictation was prepared with Dragon dictation along with smaller phrase technology. Any transcriptional errors that result from this process are unintentional.

## 2017-03-02 NOTE — Progress Notes (Addendum)
[facility] Critical Care Medicine Progess Note    SYNOPSIS   76 year old male with pneumonia and ischemic bowel, status post ex-lap on 7/25. No bowel resection undertaken. Course complicated by severe septic shock, high O2 requirements, acute renal failure.  ASSESSMENT/PLAN    PULMONARY A:Acute hypoxic respiratory failure, vent dependent. I personally reviewed. Chest x-ray imaging, most recent chest x-ray showed bibasilar atelectasis, otherwise unremarkable lungs. -Blood gas 7.16/56/59/27 with  mixed metabolic and respiratory acidosis. -Intubated 7/25>> P:   -Will increase ventilator rate.  VENTILATOR SETTINGS: Vent Mode: PRVC FiO2 (%):  [90 %] 90 % Set Rate:  [15 bmp-18 bmp] 18 bmp Vt Set:  [500 mL-550 mL] 550 mL PEEP:  [8 cmH20] 8 cmH20  CARDIOVASCULAR A: Severe septic shock with hypotension. Currently on levofloxacin at 60 mics, phenylephrine at 220 mics. P:  Continue pressors, wean down as tolerated. HEMODYNAMICS:    RENAL A:  Acute renal failure secondary to above. P:   -Creatinine has decreased slightly, urine output has improved. INTAKE / OUTPUT:  Intake/Output Summary (Last 24 hours) at 03/02/17 0735 Last data filed at 03/02/17 0600  Gross per 24 hour  Intake          6841.42 ml  Output             1180 ml  Net          5661.42 ml    GASTROINTESTINAL A:  Postop day 1 from exploratory laparoscopy with immediate closure. Ileus. P:   Surgery following,  Famotidine.   HEMATOLOGIC A:  Leukocytosis, likely reactive to sepsis. P:  -Continue to monitor.  INFECTIOUS A:  Severe septic shock, suspect abdominal source and/or pulmonary.Marland Kitchen. P:    Micro/culture results:  BCx2 7/24: Negative UC 7/24: Negative Sputum-- MRSA PCR 7/25: Negative. Procalcitonin 3.58>> 13.81, consistent with bacterial infection.  Antibiotics: Zosyn 7/28>> Vancomycin  ENDOCRINE A: Minimal hyperglycemia.  P:   SSI  NEUROLOGIC A:  Sedated.  P:   RASS goal:  -1   MAJOR EVENTS/TEST RESULTS: 7/25 Patient sent to the ICU  with shortness of breath secondary to Pneumonia.   7/25; exploratory laparoscopy, with no bowel resection.  Best Practices  DVT Prophylaxis: heparin SQ GI Prophylaxis: Famotidine.    ---------------------------------------   ----------------------------------------   Name: Lorenda PeckJohn Hucker MRN: [id number] DOB: 06/10/1941    ADMISSION DATE:  02/28/2017    SUBJECTIVE:   Pt currently on the ventilator, can not provide history or review of systems.   Review of Systems:  --  VITAL SIGNS: Temp:  [97.1 F (36.2 C)-100.1 F (37.8 C)] 100.1 F (37.8 C) (07/26 0705) Pulse Rate:  [60-74] 67 (07/26 0705) Resp:  [15-39] 20 (07/26 0705) BP: (62-151)/(40-111) 109/73 (07/26 0705) SpO2:  [90 %-98 %] 97 % (07/26 0705) Arterial Line BP: (60-131)/(39-98) 108/44 (07/26 0705) FiO2 (%):  [90 %] 90 % (07/26 0333)     PHYSICAL EXAMINATION: Physical Examination:   VS: BP 109/73   Pulse 67   Temp 100.1 F (37.8 C)   Resp 20   Ht 5\' 8"  (1.727 m)   Wt 216 lb 14.9 oz (98.4 kg)   SpO2 97%   BMI 32.98 kg/m   General Appearance: No distress arousable.  Neuro:without focal findings, mental status reduced HEENT: PERRLA, EOM intact. Pulmonary: normal breath sounds   CardiovascularNormal S1,S2.  No m/r/g.   Abdomen: Benign, Soft, non-tender. Renal:  No costovertebral tenderness  GU:  Not performed at this time. Endocrine: No evident thyromegaly. Skin:   warm, no  rashes, no ecchymosis  Extremities: normal, no cyanosis, clubbing.    LABORATORY PANEL:   CBC  Recent Labs Lab 03/02/17 0500  WBC 24.7*  HGB 13.0  HCT 38.4*  PLT 149*    Chemistries   Recent Labs Lab 03/01/17 0801  03/02/17 0500  NA 134*  < > 136  K 4.7  < > 4.6  CL 101  < > 108  CO2 24  < > 17*  GLUCOSE 164*  < > 179*  BUN 62*  < > 62*  CREATININE 3.40*  < > 3.03*  CALCIUM 7.5*  < > 6.8*  MG 1.9  --   --   AST  --   --  62*  ALT  --   --   32  ALKPHOS  --   --  45  BILITOT  --   --  0.7  < > = values in this interval not displayed.   Recent Labs Lab 03/01/17 0217 03/01/17 0730 03/01/17 1753 03/01/17 2011 03/02/17 0009 03/02/17 0410  GLUCAP 151* 160* 137* 143* 156* 160*    Recent Labs Lab 03/01/17 0500  PHART 7.16*  PCO2ART 56*  PO2ART 69*    Recent Labs Lab 02/28/17 2158 03/01/17 0330 03/02/17 0500  AST 65* 56* 62*  ALT 34 33 32  ALKPHOS 65 58 45  BILITOT 0.9 0.9 0.7  ALBUMIN 3.0* 2.7* 1.9*    Cardiac Enzymes  Recent Labs Lab 03/01/17 1356  TROPONINI 0.09*    RADIOLOGY:  Ct Abdomen Pelvis Wo Contrast  Result Date: 03/01/2017 CLINICAL DATA:  Abdominal pain and distension.  Nausea and vomiting. EXAM: CT ABDOMEN AND PELVIS WITHOUT CONTRAST TECHNIQUE: Multidetector CT imaging of the abdomen and pelvis was performed following the standard protocol without IV contrast. COMPARISON:  None. FINDINGS: Lower chest: Pacemaker partially included. Small left pleural effusion with adjacent basilar airspace disease, atelectasis versus pneumonia. Small right pleural effusion with adjacent atelectasis. Mild cardiomegaly. Hepatobiliary: Question of nodular hepatic contours. No evidence of focal hepatic lesion on noncontrast exam. Multiple calcified gallstones including possible stone in the gallbladder neck. No biliary dilatation. Pancreas: There is soft tissue stranding about the entire pancreas consistent with pancreatitis. Small amount peripancreatic fluid. No loculated fluid collection. No ductal dilatation. Spleen: Normal in size without focal abnormality. Minimal perisplenic fluid tracking from the pancreatic process. Adrenals/Urinary Tract: Mild bilateral adrenal thickening without discrete nodule. No hydronephrosis. Mild nonspecific perinephric edema. No urolithiasis. Urinary bladder is physiologically distended. There is mild bladder wall thickening. Small left lateral bladder diverticulum. Stomach/Bowel: There is  an enteric tube with tip in the distal esophagus. Stomach distended with fluid. There is a duodenum diverticulum. Dilated fluid-filled small bowel diffusely. Small bowel pneumatosis in the left mid abdomen, image 64 series 2. Degree of dilatation diminishes distally. No discrete transition point. Normal appendix. Mild gaseous distention of colon without colonic wall thickening. Diverticulosis of the sigmoid colon without acute inflammation. Vascular/Lymphatic: There is air in the mesenteric vessels, for example image 58 series 2. This is in the region of small bowel pneumatosis. Minimal air tracks into the proximal SMV. Aortic atherosclerosis without aneurysm. No definite adenopathy. Reproductive: Prominent prostate gland with central calcifications. Other: Small volume abdominopelvic ascites. No free air. No loculated abscess. Musculoskeletal: Degenerative change in the spine. There are no acute or suspicious osseous abnormalities. IMPRESSION: 1. Findings consistent with ischemic small bowel with small bowel pneumatosis in the left lower abdomen and mesenteric gas. No free air to suggest perforation. Diffusely dilated fluid-filled bowel  without transition point suggesting ileus. 2. Acute pancreatitis. Peripancreatic inflammation and small amount peripancreatic fluid. 3. Tip of the enteric tube in the distal esophagus, recommend advancement. 4. Nodular hepatic contours suggesting cirrhosis. 5. Cholelithiasis. 6.  Aortic Atherosclerosis (ICD10-I70.0). Critical Value/emergent results were called by telephone at the time of interpretation on 03/01/2017 at 2:28 am to [facility] , who verbally acknowledged these results. Electronically Signed   By: Rubye OaksMelanie  Ehinger M.D.   On: 03/01/2017 02:30   Dg Abd 1 View  Result Date: 03/01/2017 CLINICAL DATA:  OG tube placement. EXAM: ABDOMEN - 1 VIEW COMPARISON:  Abdominal CT earlier this day. FINDINGS: Tip and side port of the enteric tube is been advanced, now in the  stomach. Gas just distention of stomach and small bowel. No evidence of free air. IMPRESSION: Tip and side port of the enteric tube in the stomach. Electronically Signed   By: Rubye OaksMelanie  Ehinger M.D.   On: 03/01/2017 03:52   Ct Head Wo Contrast  Result Date: 03/01/2017 CLINICAL DATA:  Altered mental status. EXAM: CT HEAD WITHOUT CONTRAST TECHNIQUE: Contiguous axial images were obtained from the base of the skull through the vertex without intravenous contrast. COMPARISON:  None. FINDINGS: Brain: No evidence of acute infarction, hemorrhage, hydrocephalus, extra-axial collection or mass lesion/mass effect. Age-related cerebral atrophy with compensatory dilatation of the ventricles. Vascular: No hyperdense vessel or unexpected calcification. Skull: Normal. Negative for fracture or focal lesion. Sinuses/Orbits: Mild mucosal thickening of the left maxillary sinus. The remaining paranasal sinuses and bilateral mastoid air cells are clear. The orbits are unremarkable. Other: None. IMPRESSION: No acute intracranial abnormality. Electronically Signed   By: Obie DredgeWilliam T Derry M.D.   On: 03/01/2017 09:52   Dg Chest Port 1 View  Result Date: 03/01/2017 CLINICAL DATA:  Altered level of consciousness EXAM: PORTABLE CHEST 1 VIEW COMPARISON:  03/01/2017 FINDINGS: Support devices are stable. Low lung volumes with bibasilar atelectasis and mild vascular congestion. Mild cardiomegaly. Possible small layering effusions. IMPRESSION: Low lung volumes with bibasilar atelectasis and small layering effusions. Mild vascular congestion. Electronically Signed   By: Charlett NoseKevin  Dover M.D.   On: 03/01/2017 09:18   Dg Chest Port 1 View  Result Date: 03/01/2017 CLINICAL DATA:  Intubation EXAM: PORTABLE CHEST 1 VIEW COMPARISON:  02/28/2017 FINDINGS: ET tube tip is 1.6 cm above the carina. Right-sided central line terminates at the expected location of the cavoatrial junction. Enteric tube extends below the diaphragm and beyond the inferior edge  of the image. No pneumothorax. Persistent central and basilar opacities without significant interval change. IMPRESSION: 1. Satisfactorily positioned support equipment. 2. No significant interval change in the bilateral airspace opacities. 3. No pneumothorax Electronically Signed   By: Ellery Plunkaniel R Mitchell M.D.   On: 03/01/2017 03:53   Dg Chest Port 1 View  Result Date: 02/28/2017 CLINICAL DATA:  Vomiting for several days EXAM: PORTABLE CHEST 1 VIEW COMPARISON:  None. FINDINGS: Cardiac shadow is mildly enlarged. Pacing device is noted. Mild left basilar opacity and small effusion are seen. The right lung is clear. The bony structures are within normal limits. IMPRESSION: Left basilar opacity with associated small effusion. Electronically Signed   By: Alcide CleverMark  Lukens M.D.   On: 02/28/2017 22:25        --Wells Guileseep Makayli Bracken, MD.  ICU Pager: [phone] [place] [facility] Office Number: [phone]   03/02/2017   Critical Care Attestation.  I have personally obtained a history, examined the patient, evaluated laboratory and imaging results, formulated the assessment and plan and placed  orders. The Patient requires high complexity decision making for assessment and support, frequent evaluation and titration of therapies, application of advanced monitoring technologies and extensive interpretation of multiple databases. The patient has critical illness that could lead imminently to failure of 1 or more organ systems and requires the highest level of physician preparedness to intervene.  Critical Care Time devoted to patient care services described in this note is 35 minutes and is exclusive of time spent in procedures supervisory time of NP.

## 2017-03-02 NOTE — Progress Notes (Signed)
Advance Care Planning:   Spoke with daughter who is POA and explained current patient status, which is very sick with severe septic shock, however he is showing some signs of improvement.  She expressed that he would not want to undergo CPR, therefore will change code status to DNR.   Alvin Guileseep Kendria Halberg, MD.  03/02/2017

## 2017-03-02 NOTE — Care Management (Addendum)
[facility].  Faxed H/P and surgical note.  Patient had exploratory lap for bowel ischemia but did not require resection. He is also being treated for pneumonia, on pressor and intubated.  Patient is not stable for transfer consideration [facility]

## 2017-03-02 NOTE — Progress Notes (Signed)
VT on ventilator changed to match patient order. Pt was on a Vt of 500 but order for 550.

## 2017-03-02 NOTE — Progress Notes (Signed)
1 Day Post-Op   Subjective:  Patient remains critically ill. He is intubated, sedated and on the ventilator in the ICU. Has had continued requirements for vasopressors overnight.  Vital signs in last 24 hours: Temp:  [97.8 F (36.6 C)-100.1 F (37.8 C)] 100.1 F (37.8 C) (07/26 0705) Pulse Rate:  [60-74] 60 (07/26 0930) Resp:  [17-39] 18 (07/26 0930) BP: (66-151)/(40-111) 104/45 (07/26 0930) SpO2:  [92 %-98 %] 93 % (07/26 0930) Arterial Line BP: (64-131)/(39-98) 102/45 (07/26 1000) FiO2 (%):  [80 %-90 %] 80 % (07/26 0736) Last BM Date: 02/28/17  Intake/Output from previous day: 07/25 0701 - 07/26 0700 In: 7941.4 [I.V.:6791.4; NG/GT:50; IV Piggyback:1100] Out: 1880 [Urine:1380; Emesis/NG output:400; Blood:100]  GI: Abdomen soft but moderately distended with tympany in all 4 quadrants. Midline dressing and staples in place without any evidence of drainage.  Lab Results:  CBC  Recent Labs  03/01/17 0801 03/02/17 0500  WBC 16.4* 24.7*  HGB 14.8 13.0  HCT 44.5 38.4*  PLT 152 149*   CMP     Component Value Date/Time   NA 136 03/02/2017 0500   K 4.6 03/02/2017 0500   CL 108 03/02/2017 0500   CO2 17 (L) 03/02/2017 0500   GLUCOSE 179 (H) 03/02/2017 0500   BUN 62 (H) 03/02/2017 0500   CREATININE 3.03 (H) 03/02/2017 0500   CALCIUM 6.8 (L) 03/02/2017 0500   PROT 4.5 (L) 03/02/2017 0500   ALBUMIN 1.9 (L) 03/02/2017 0500   AST 62 (H) 03/02/2017 0500   ALT 32 03/02/2017 0500   ALKPHOS 45 03/02/2017 0500   BILITOT 0.7 03/02/2017 0500   GFRNONAA 19 (L) 03/02/2017 0500   GFRAA 22 (L) 03/02/2017 0500   PT/INR  Recent Labs  02/28/17 2158 03/01/17 0330  LABPROT 16.6* 16.1*  INR 1.33 1.28    Studies/Results: Ct Abdomen Pelvis Wo Contrast  Result Date: 03/01/2017 CLINICAL DATA:  Abdominal pain and distension.  Nausea and vomiting. EXAM: CT ABDOMEN AND PELVIS WITHOUT CONTRAST TECHNIQUE: Multidetector CT imaging of the abdomen and pelvis was performed following the  standard protocol without IV contrast. COMPARISON:  None. FINDINGS: Lower chest: Pacemaker partially included. Small left pleural effusion with adjacent basilar airspace disease, atelectasis versus pneumonia. Small right pleural effusion with adjacent atelectasis. Mild cardiomegaly. Hepatobiliary: Question of nodular hepatic contours. No evidence of focal hepatic lesion on noncontrast exam. Multiple calcified gallstones including possible stone in the gallbladder neck. No biliary dilatation. Pancreas: There is soft tissue stranding about the entire pancreas consistent with pancreatitis. Small amount peripancreatic fluid. No loculated fluid collection. No ductal dilatation. Spleen: Normal in size without focal abnormality. Minimal perisplenic fluid tracking from the pancreatic process. Adrenals/Urinary Tract: Mild bilateral adrenal thickening without discrete nodule. No hydronephrosis. Mild nonspecific perinephric edema. No urolithiasis. Urinary bladder is physiologically distended. There is mild bladder wall thickening. Small left lateral bladder diverticulum. Stomach/Bowel: There is an enteric tube with tip in the distal esophagus. Stomach distended with fluid. There is a duodenum diverticulum. Dilated fluid-filled small bowel diffusely. Small bowel pneumatosis in the left mid abdomen, image 64 series 2. Degree of dilatation diminishes distally. No discrete transition point. Normal appendix. Mild gaseous distention of colon without colonic wall thickening. Diverticulosis of the sigmoid colon without acute inflammation. Vascular/Lymphatic: There is air in the mesenteric vessels, for example image 58 series 2. This is in the region of small bowel pneumatosis. Minimal air tracks into the proximal SMV. Aortic atherosclerosis without aneurysm. No definite adenopathy. Reproductive: Prominent prostate gland with central calcifications. Other:  Small volume abdominopelvic ascites. No free air. No loculated abscess.  Musculoskeletal: Degenerative change in the spine. There are no acute or suspicious osseous abnormalities. IMPRESSION: 1. Findings consistent with ischemic small bowel with small bowel pneumatosis in the left lower abdomen and mesenteric gas. No free air to suggest perforation. Diffusely dilated fluid-filled bowel without transition point suggesting ileus. 2. Acute pancreatitis. Peripancreatic inflammation and small amount peripancreatic fluid. 3. Tip of the enteric tube in the distal esophagus, recommend advancement. 4. Nodular hepatic contours suggesting cirrhosis. 5. Cholelithiasis. 6.  Aortic Atherosclerosis (ICD10-I70.0). Critical Value/emergent results were called by telephone at the time of interpretation on 03/01/2017 at 2:28 am to NP [facility] , who verbally acknowledged these results. Electronically Signed   By: Rubye OaksMelanie  Ehinger M.D.   On: 03/01/2017 02:30   Dg Abd 1 View  Result Date: 03/01/2017 CLINICAL DATA:  OG tube placement. EXAM: ABDOMEN - 1 VIEW COMPARISON:  Abdominal CT earlier this day. FINDINGS: Tip and side port of the enteric tube is been advanced, now in the stomach. Gas just distention of stomach and small bowel. No evidence of free air. IMPRESSION: Tip and side port of the enteric tube in the stomach. Electronically Signed   By: Rubye OaksMelanie  Ehinger M.D.   On: 03/01/2017 03:52   Ct Head Wo Contrast  Result Date: 03/01/2017 CLINICAL DATA:  Altered mental status. EXAM: CT HEAD WITHOUT CONTRAST TECHNIQUE: Contiguous axial images were obtained from the base of the skull through the vertex without intravenous contrast. COMPARISON:  None. FINDINGS: Brain: No evidence of acute infarction, hemorrhage, hydrocephalus, extra-axial collection or mass lesion/mass effect. Age-related cerebral atrophy with compensatory dilatation of the ventricles. Vascular: No hyperdense vessel or unexpected calcification. Skull: Normal. Negative for fracture or focal lesion. Sinuses/Orbits: Mild mucosal thickening of  the left maxillary sinus. The remaining paranasal sinuses and bilateral mastoid air cells are clear. The orbits are unremarkable. Other: None. IMPRESSION: No acute intracranial abnormality. Electronically Signed   By: Obie DredgeWilliam T Derry M.D.   On: 03/01/2017 09:52   Dg Chest Port 1 View  Result Date: 03/01/2017 CLINICAL DATA:  Altered level of consciousness EXAM: PORTABLE CHEST 1 VIEW COMPARISON:  03/01/2017 FINDINGS: Support devices are stable. Low lung volumes with bibasilar atelectasis and mild vascular congestion. Mild cardiomegaly. Possible small layering effusions. IMPRESSION: Low lung volumes with bibasilar atelectasis and small layering effusions. Mild vascular congestion. Electronically Signed   By: Charlett NoseKevin  Dover M.D.   On: 03/01/2017 09:18   Dg Chest Port 1 View  Result Date: 03/01/2017 CLINICAL DATA:  Intubation EXAM: PORTABLE CHEST 1 VIEW COMPARISON:  02/28/2017 FINDINGS: ET tube tip is 1.6 cm above the carina. Right-sided central line terminates at the expected location of the cavoatrial junction. Enteric tube extends below the diaphragm and beyond the inferior edge of the image. No pneumothorax. Persistent central and basilar opacities without significant interval change. IMPRESSION: 1. Satisfactorily positioned support equipment. 2. No significant interval change in the bilateral airspace opacities. 3. No pneumothorax Electronically Signed   By: Ellery Plunkaniel R Mitchell M.D.   On: 03/01/2017 03:53   Dg Chest Port 1 View  Result Date: 02/28/2017 CLINICAL DATA:  Vomiting for several days EXAM: PORTABLE CHEST 1 VIEW COMPARISON:  None. FINDINGS: Cardiac shadow is mildly enlarged. Pacing device is noted. Mild left basilar opacity and small effusion are seen. The right lung is clear. The bony structures are within normal limits. IMPRESSION: Left basilar opacity with associated small effusion. Electronically Signed   By: Alcide CleverMark  Lukens M.D.   On:  02/28/2017 22:25    Assessment/Plan: 76 year old male  postop day #1 from a negative exploratory laparotomy that was undertaken for bowel ischemia. Patient continues to be critically ill. No plans for further surgical intervention at this time. Surgery will continue to follow with you.   Ricarda Frame, MD [facility] General Surgeon [facility]  Day [facility] [phone] Night [facility] [phone]  03/02/2017

## 2017-03-03 ENCOUNTER — Inpatient Hospital Stay: Payer: Medicare Other

## 2017-03-03 DIAGNOSIS — R6521 Severe sepsis with septic shock: Secondary | ICD-10-CM

## 2017-03-03 DIAGNOSIS — A419 Sepsis, unspecified organism: Principal | ICD-10-CM

## 2017-03-03 DIAGNOSIS — R140 Abdominal distension (gaseous): Secondary | ICD-10-CM

## 2017-03-03 LAB — CBC
HCT: 36.5 % — ABNORMAL LOW (ref 40.0–52.0)
Hemoglobin: 12.3 g/dL — ABNORMAL LOW (ref 13.0–18.0)
MCH: 33.2 pg (ref 26.0–34.0)
MCHC: 33.6 g/dL (ref 32.0–36.0)
MCV: 98.7 fL (ref 80.0–100.0)
PLATELETS: 144 10*3/uL — AB (ref 150–440)
RBC: 3.7 MIL/uL — AB (ref 4.40–5.90)
RDW: 14.3 % (ref 11.5–14.5)
WBC: 29 10*3/uL — ABNORMAL HIGH (ref 3.8–10.6)

## 2017-03-03 LAB — BASIC METABOLIC PANEL
Anion gap: 6 (ref 5–15)
BUN: 51 mg/dL — AB (ref 6–20)
CHLORIDE: 114 mmol/L — AB (ref 101–111)
CO2: 18 mmol/L — ABNORMAL LOW (ref 22–32)
Calcium: 7 mg/dL — ABNORMAL LOW (ref 8.9–10.3)
Creatinine, Ser: 2 mg/dL — ABNORMAL HIGH (ref 0.61–1.24)
GFR calc Af Amer: 36 mL/min — ABNORMAL LOW (ref >60)
GFR calc non Af Amer: 31 mL/min — ABNORMAL LOW (ref >60)
GLUCOSE: 181 mg/dL — AB (ref 65–99)
POTASSIUM: 4.8 mmol/L (ref 3.5–5.1)
Sodium: 138 mmol/L (ref 135–145)

## 2017-03-03 LAB — BLOOD GAS, ARTERIAL
ACID-BASE DEFICIT: 9.2 mmol/L — AB (ref 0.0–2.0)
BICARBONATE: 17 mmol/L — AB (ref 20.0–28.0)
FIO2: 0.4
LHR: 22 {breaths}/min
MECHVT: 550 mL
O2 SAT: 92 %
PCO2 ART: 37 mmHg (ref 32.0–48.0)
PEEP/CPAP: 5 cmH2O
PH ART: 7.27 — AB (ref 7.350–7.450)
PO2 ART: 73 mmHg — AB (ref 83.0–108.0)
Patient temperature: 37

## 2017-03-03 LAB — PHOSPHORUS
Phosphorus: 3.9 mg/dL (ref 2.5–4.6)
Phosphorus: 3.2 mg/dL (ref 2.5–4.6)

## 2017-03-03 LAB — GLUCOSE, CAPILLARY
Glucose-Capillary: 123 mg/dL — ABNORMAL HIGH (ref 65–99)
Glucose-Capillary: 133 mg/dL — ABNORMAL HIGH (ref 65–99)
Glucose-Capillary: 149 mg/dL — ABNORMAL HIGH (ref 65–99)
Glucose-Capillary: 159 mg/dL — ABNORMAL HIGH (ref 65–99)
Glucose-Capillary: 168 mg/dL — ABNORMAL HIGH (ref 65–99)
Glucose-Capillary: 173 mg/dL — ABNORMAL HIGH (ref 65–99)

## 2017-03-03 LAB — MAGNESIUM: MAGNESIUM: 2.1 mg/dL (ref 1.7–2.4)

## 2017-03-03 LAB — PROCALCITONIN: Procalcitonin: 11.35 ng/mL

## 2017-03-03 MED ORDER — HYDROCORTISONE NA SUCCINATE PF 100 MG IJ SOLR
25.0000 mg | Freq: Two times a day (BID) | INTRAMUSCULAR | Status: DC
  Administered 2017-03-03: 25 mg via INTRAVENOUS
  Filled 2017-03-03: qty 2

## 2017-03-03 MED ORDER — PRO-STAT SUGAR FREE PO LIQD
30.0000 mL | Freq: Two times a day (BID) | ORAL | Status: DC
  Administered 2017-03-03 (×2): 30 mL

## 2017-03-03 MED ORDER — VITAL HIGH PROTEIN PO LIQD
1000.0000 mL | ORAL | Status: DC
  Administered 2017-03-03: 1000 mL

## 2017-03-03 NOTE — Progress Notes (Signed)
Chaplain went to visit patient, while on rounds. CH offered silent prayer, as client was not awake.    03/03/17 1540  Clinical Encounter Type  Visited With Patient;Patient not available  Visit Type Initial;Spiritual support  Referral From Chaplain  Consult/Referral To Chaplain  Spiritual Encounters  Spiritual Needs Other (Comment)

## 2017-03-03 NOTE — Clinical Social Work Note (Signed)
Clinical Social Work Assessment  Patient Details  Name: Alvin [name] MRN: [id number] Date of Birth: 03/02/1941  Date of referral:  03/03/17               Reason for consult:  Facility Placement                Permission sought to share information with:    Permission granted to share information::     Name::        Agency::     Relationship::     Contact Information:     Housing/Transportation Living arrangements for the past 2 months:  Assisted Living Facility Source of Information:  Adult Children Patient Interpreter Needed:  None Criminal Activity/Legal Involvement Pertinent to Current Situation/Hospitalization:  No - Comment as needed Significant Relationships:  Adult Children, Siblings Lives with:  Facility Resident Do you feel safe going back to the place where you live?  Yes Need for family participation in patient care:  Yes (Comment)  Care giving concerns:  Patient has been a resident at [facility] for 4 months.    Social Worker assessment / plan:  CSW aware that patient is from [facility]. CSW spoke with patient's daughter: Alvin [name]:: [phone] in patient's ICU room this afternoon. Bobbette confirmed that patient has been at [facility] for 4 months and that he has been healthy and ambulatory. 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 She also stated that patient has a mentally challenged sister whom he visited everyday so she managed to work with [facility] and have patient and his sibling placed together in the same room. Alvin [name] is hoping that patient will be able to return to [facility] at discharge.   Employment status:  Retired Health and safety inspectornsurance information:  Medicare PT Recommendations:    Information / Referral to community resources:     Patient/Family's Response to care:  Patient's daughter expressed appreciation for CSW visit.  Patient/Family's Understanding of and Emotional  Response to Diagnosis, Current Treatment, and Prognosis:  Patient's daughter is hoping her dad will be able to return to his baseline prior to discharge even though he is on a vent.   Emotional Assessment Appearance:  Appears stated age Attitude/Demeanor/Rapport:   (patient on vent) Affect (typically observed):   (patient on vent) Orientation:   (patient on vent) Alcohol / Substance use:  Not Applicable Psych involvement (Current and /or in the community):  No (Comment)  Discharge Needs  Concerns to be addressed:  Care Coordination Readmission within the last 30 days:  No Current discharge risk:  None Barriers to Discharge:  No Barriers Identified   York SpanielMonica Danial Sisley, LCSW 03/03/2017, 4:51 PM

## 2017-03-03 NOTE — Care Management (Signed)
Refaxed information to [facility] as did not receive what was faxed yesterday.  Patient is still not medically stable for transfer

## 2017-03-03 NOTE — Progress Notes (Signed)
[facility] Medicine Progess Note    SYNOPSIS   76 year old male with pneumonia and ischemic bowel, status post ex-lap on 7/25. No bowel resection undertaken. Course complicated by severe septic shock, high O2 requirements, acute renal failure.  ASSESSMENT/PLAN    PULMONARY A:Acute hypoxic respiratory failure, vent dependent. I personally reviewed. Chest x-ray imaging, most recent chest x-ray 7/27 showed bibasilar atelectasis, otherwise unremarkable lungs. -Blood gas 7.27/37/73/17, consistent with metabolic acidosis -Intubated 7/25>> P:   -Settings reviewed as below, we'll continue current settings.  VENTILATOR SETTINGS: Vent Mode: PRVC FiO2 (%):  [40 %-50 %] 50 % Set Rate:  [22 bmp] 22 bmp Vt Set:  [550 mL] 550 mL PEEP:  [5 cmH20-8 cmH20] 5 cmH20 Plateau Pressure:  [19 cmH20-20 cmH20] 19 cmH20  CARDIOVASCULAR A: Severe septic shock with hypotension. Currently on vasopressin at 0.04, phenylephrine at 340 mics. P:  Continue pressors, wean down as tolerated.  HEMODYNAMICS:    RENAL A:  Acute renal failure secondary to above. P:   -Creatinine has decreased slightly, urine output has continued to improved. INTAKE / OUTPUT:  Intake/Output Summary (Last 24 hours) at 03/03/17 0943 Last data filed at 03/03/17 0855  Gross per 24 hour  Intake          6998.75 ml  Output             1635 ml  Net          5363.75 ml    GASTROINTESTINAL A:  Postop day 2 from exploratory laparoscopy with immediate closure. Ileus. P:   Surgery following,  Famotidine.   HEMATOLOGIC A:  Leukocytosis, likely reactive to sepsis and stress dose steroids.  P:  -Continue to monitor. --Decrease steroid dose today.   INFECTIOUS A:  Severe septic shock, suspect abdominal source and/or pulmonary.Marland Kitchen. P:    Micro/culture results:  BCx2 7/24: Negative UC 7/24: Negative Sputum-- MRSA PCR 7/25: Negative. Procalcitonin 3.58>> 13.81>>11.35, consistent with bacterial  infection.  Antibiotics: Zosyn 7/28>> Vancomycin  ENDOCRINE A: Minimal hyperglycemia.  P:   SSI  NEUROLOGIC A:  Sedated.  P:   RASS goal: -1   MAJOR EVENTS/TEST RESULTS: 7/25 Patient sent to the ICU  with shortness of breath secondary to Pneumonia.   7/25; exploratory laparoscopy, with no bowel resection.  Best Practices  DVT Prophylaxis: heparin SQ GI Prophylaxis: Famotidine.    ---------------------------------------   ----------------------------------------   Name: Alvin [name] MRN: [id number] DOB: 02/04/1941    ADMISSION DATE:  02/28/2017    SUBJECTIVE:   Pt currently on the ventilator, can not provide history or review of systems.   Review of Systems:  --  VITAL SIGNS: Temp:  [97.7 F (36.5 C)-98.4 F (36.9 C)] 97.7 F (36.5 C) (07/27 0800) Pulse Rate:  [67-110] 70 (07/27 0800) Resp:  [0-25] 22 (07/27 0800) BP: (76-121)/(42-84) 100/62 (07/27 0800) SpO2:  [90 %-99 %] 91 % (07/27 0800) Arterial Line BP: (90-132)/(45-63) 108/59 (07/27 0800) FiO2 (%):  [40 %-50 %] 50 % (07/27 0800)     PHYSICAL EXAMINATION: Physical Examination:   VS: BP 100/62   Pulse 70   Temp 97.7 F (36.5 C) (Oral)   Resp (!) 22   Ht 5\' 8"  (1.727 m)   Wt 216 lb 14.9 oz (98.4 kg)   SpO2 91%   BMI 32.98 kg/m   General Appearance: No distress arousable.  Neuro:without focal findings, mental status reduced HEENT: PERRLA, EOM intact. Pulmonary: reduced bilateral breath sounds   CardiovascularNormal S1,S2.  No m/r/g.  Abdomen: Benign, Soft, non-tender. Renal:  No costovertebral tenderness  GU:  Not performed at this time. Endocrine: No evident thyromegaly. Skin:   warm, no rashes, no ecchymosis  Extremities: normal, no cyanosis, clubbing.    LABORATORY PANEL:   CBC  Recent Labs Lab 03/03/17 0430  WBC 29.0*  HGB 12.3*  HCT 36.5*  PLT 144*    Chemistries   Recent Labs Lab 03/02/17 0500 03/03/17 0430  NA 136 138  K 4.6 4.8  CL 108 114*  CO2 17*  18*  GLUCOSE 179* 181*  BUN 62* 51*  CREATININE 3.03* 2.00*  CALCIUM 6.8* 7.0*  MG  --  2.1  AST 62*  --   ALT 32  --   ALKPHOS 45  --   BILITOT 0.7  --      Recent Labs Lab 03/02/17 1146 03/02/17 1616 03/02/17 1946 03/03/17 0000 03/03/17 0404 03/03/17 0728  GLUCAP 163* 141* 160* 168* 159* 173*    Recent Labs Lab 03/01/17 0500 03/03/17 0330  PHART 7.16* 7.27*  PCO2ART 56* 37  PO2ART 69* 73*    Recent Labs Lab 02/28/17 2158 03/01/17 0330 03/02/17 0500  AST 65* 56* 62*  ALT 34 33 32  ALKPHOS 65 58 45  BILITOT 0.9 0.9 0.7  ALBUMIN 3.0* 2.7* 1.9*    Cardiac Enzymes  Recent Labs Lab 03/01/17 1356  TROPONINI 0.09*    RADIOLOGY:  Dg Chest 1 View  Result Date: 03/03/2017 CLINICAL DATA:  Shortness of breath. EXAM: CHEST 1 VIEW COMPARISON:  03/01/2017. FINDINGS: Endotracheal tube, NG tube, right IJ line stable position. Cardiac pacer with lead tips in right atrium right ventricle. Cardiomegaly. Diffuse bilateral interstitial prominence with bilateral small pleural effusions. Low lung volumes with basilar atelectasis. No pneumothorax. IMPRESSION: 1. Lines and tubes in stable position. 2. Cardiac pacer stable position. Cardiomegaly with diffuse bilateral from interstitial prominence and small bilateral pleural effusions. Findings consistent with CHF. 3. Low lung volumes with basilar atelectasis. Electronically Signed   By: Maisie Fushomas  Register   On: 03/03/2017 06:46        --Wells Guileseep Filomena Pokorney, MD.  ICU Pager: [phone] [place] [facility] Office Number: [phone]   03/03/2017   Critical Care Attestation.  I have personally obtained a history, examined the patient, evaluated laboratory and imaging results, formulated the assessment and plan and placed orders. The Patient requires high complexity decision making for assessment and support, frequent evaluation and titration of therapies, application of advanced monitoring technologies and  extensive interpretation of multiple databases. The patient has critical illness that could lead imminently to failure of 1 or more organ systems and requires the highest level of physician preparedness to intervene.  Critical Care Time devoted to patient care services described in this note is 32 minutes and is exclusive of time spent in procedures supervisory time of NP.

## 2017-03-03 NOTE — Progress Notes (Signed)
2 Days Post-Op   Subjective:  Patient remains in the ICU intubated and sedated. His vasopressor requirement has been decreasing overnight.  Vital signs in last 24 hours: Temp:  [98.1 F (36.7 C)-98.4 F (36.9 C)] 98.2 F (36.8 C) (07/27 0400) Pulse Rate:  [60-110] 73 (07/27 0600) Resp:  [0-25] 22 (07/27 0600) BP: (76-121)/(42-84) 95/52 (07/27 0600) SpO2:  [90 %-99 %] 91 % (07/27 0600) Arterial Line BP: (90-132)/(45-63) 126/61 (07/27 0000) FiO2 (%):  [40 %-50 %] 50 % (07/27 0733) Last BM Date: 03/03/17  Intake/Output from previous day: 07/26 0701 - 07/27 0700 In: 6998.8 [I.V.:6998.8] Out: 1385 [Urine:1385]  GI: Abdomen soft, moderately distended. Staple line in place to the midline without any evidence of erythema or drainage. Honeycomb in place.  Lab Results:  CBC  Recent Labs  03/02/17 0500 03/03/17 0430  WBC 24.7* 29.0*  HGB 13.0 12.3*  HCT 38.4* 36.5*  PLT 149* 144*   CMP     Component Value Date/Time   NA 138 03/03/2017 0430   K 4.8 03/03/2017 0430   CL 114 (H) 03/03/2017 0430   CO2 18 (L) 03/03/2017 0430   GLUCOSE 181 (H) 03/03/2017 0430   BUN 51 (H) 03/03/2017 0430   CREATININE 2.00 (H) 03/03/2017 0430   CALCIUM 7.0 (L) 03/03/2017 0430   PROT 4.5 (L) 03/02/2017 0500   ALBUMIN 1.9 (L) 03/02/2017 0500   AST 62 (H) 03/02/2017 0500   ALT 32 03/02/2017 0500   ALKPHOS 45 03/02/2017 0500   BILITOT 0.7 03/02/2017 0500   GFRNONAA 31 (L) 03/03/2017 0430   GFRAA 36 (L) 03/03/2017 0430   PT/INR  Recent Labs  02/28/17 2158 03/01/17 0330  LABPROT 16.6* 16.1*  INR 1.33 1.28    Studies/Results: Dg Chest 1 View  Result Date: 03/03/2017 CLINICAL DATA:  Shortness of breath. EXAM: CHEST 1 VIEW COMPARISON:  03/01/2017. FINDINGS: Endotracheal tube, NG tube, right IJ line stable position. Cardiac pacer with lead tips in right atrium right ventricle. Cardiomegaly. Diffuse bilateral interstitial prominence with bilateral small pleural effusions. Low lung volumes  with basilar atelectasis. No pneumothorax. IMPRESSION: 1. Lines and tubes in stable position. 2. Cardiac pacer stable position. Cardiomegaly with diffuse bilateral from interstitial prominence and small bilateral pleural effusions. Findings consistent with CHF. 3. Low lung volumes with basilar atelectasis. Electronically Signed   By: Maisie Fushomas  Register   On: 03/03/2017 06:46   Ct Head Wo Contrast  Result Date: 03/01/2017 CLINICAL DATA:  Altered mental status. EXAM: CT HEAD WITHOUT CONTRAST TECHNIQUE: Contiguous axial images were obtained from the base of the skull through the vertex without intravenous contrast. COMPARISON:  None. FINDINGS: Brain: No evidence of acute infarction, hemorrhage, hydrocephalus, extra-axial collection or mass lesion/mass effect. Age-related cerebral atrophy with compensatory dilatation of the ventricles. Vascular: No hyperdense vessel or unexpected calcification. Skull: Normal. Negative for fracture or focal lesion. Sinuses/Orbits: Mild mucosal thickening of the left maxillary sinus. The remaining paranasal sinuses and bilateral mastoid air cells are clear. The orbits are unremarkable. Other: None. IMPRESSION: No acute intracranial abnormality. Electronically Signed   By: Obie DredgeWilliam T Derry M.D.   On: 03/01/2017 09:52   Dg Chest Port 1 View  Result Date: 03/01/2017 CLINICAL DATA:  Altered level of consciousness EXAM: PORTABLE CHEST 1 VIEW COMPARISON:  03/01/2017 FINDINGS: Support devices are stable. Low lung volumes with bibasilar atelectasis and mild vascular congestion. Mild cardiomegaly. Possible small layering effusions. IMPRESSION: Low lung volumes with bibasilar atelectasis and small layering effusions. Mild vascular congestion. Electronically Signed  By: Charlett NoseKevin  Dover M.D.   On: 03/01/2017 09:18    Assessment/Plan: 76 year old male postop day #2 from a negative exploratory laparotomy. Showing mixed signs of improvement. Vasopressor requirement decreasing but continuing.  White blood cell count is increasing the. His abdominal wall exam is benign. No plans for any surgical intervention at this time. We will continue to follow along.   Ricarda Frameharles Tammye Kahler, MD [facility] General Surgeon [facility]  Day [facility] [phone] Night ASCOM [phone]  03/03/2017

## 2017-03-03 NOTE — Progress Notes (Signed)
Pharmacy Antibiotic Note  Alvin PeckJohn [name] is a 76 y.o. male admitted on 02/28/2017 with severe sepsis shock secondary to aspiration pneumonia and small bowel ischemia.  Pharmacy was consulted for vancomycin and Zosyn dosing. Vancomycin was discontinued on 7/25.  Plan: Continue Zosyn 3.375 IV EI every 8 hours.   Height: 5\' 8"  (172.7 cm) Weight: 216 lb 14.9 oz (98.4 kg) IBW/kg (Calculated) : 68.4  Temp (24hrs), Avg:98.1 F (36.7 C), Min:97.7 F (36.5 C), Max:98.4 F (36.9 C)   Recent Labs Lab 02/28/17 2158 03/01/17 0330 03/01/17 0801 03/01/17 1015 03/01/17 2329 03/02/17 0500 03/03/17 0430  WBC 23.7*  --  16.4*  --   --  24.7* 29.0*  CREATININE 4.75* 3.90* 3.40*  --  3.28* 3.03* 2.00*  LATICACIDVEN 4.6*  7.3* 3.4*  --  3.4* 4.7*  --   --     Estimated Creatinine Clearance: 36.3 mL/min (A) (by C-G formula based on SCr of 2 mg/dL (H)).    No Known Allergies  Antimicrobials this admission: Vancomycin cefepime 7/24  >> 7/25 7/25 Zosyn >>  Dose adjustments this admission:   Microbiology results: 7/24 BCx: staph species  7/24 UCx: NG final  7/25 Sputum: pending  7/25 MRSA PCR: negative    7/24 UA: (-) 7/24 CXR: L base opacity Thank you for allowing pharmacy to be a part of this patient's care.  Gardner CandleSheema M Jiaire Rosebrook, PharmD, BCPS Clinical Pharmacist 03/03/2017 11:07 AM

## 2017-03-03 NOTE — Progress Notes (Addendum)
Nutrition Follow-up  DOCUMENTATION CODES:   Obesity unspecified  INTERVENTION:  Received verbal order to begin trickle feeds from CCM Begin Vital High Protein via OGT at 1825mL/hr Pro-stat 30mL TID Goal rate is 5345mL/hr Monitor patient's tolerance for 24hrs, if tolerated, increase by 10 every 8 hours to goal rate of 6745mL/hr   Current regimen provides 780 calories, 87 grams protein, 401mL free water Goal rate provides: 1380 calories, 140 grams protein, 904mL free water  Liquid MVI daily  NUTRITION DIAGNOSIS:   Inadequate oral intake related to inability to eat as evidenced by NPO status. -ongoing  GOAL:   Provide needs based on ASPEN/SCCM guidelines -meeting  MONITOR:   Vent status, Labs, Weight trends, TF tolerance, I & O's  REASON FOR ASSESSMENT:   Ventilator    ASSESSMENT:   76 year old male with PMHx of HTN, bipolar disorder who presented with N/V for 3 days found to have ischemic small bowel with small bowel pneumatosis in left lower abdomen. Sent to ICU with SOB secondary to PNA requiring intubation. Pt s/p exploratory laparotomy without SBR on 7/25.  Discussed in Rounds Severe Sepsis w/ Septic Shock due to ischemic small bowel Access: R IJ OGT has been clamped Patient is currently intubated on ventilator support MV: 11.9 L/min Temp (24hrs), Avg:98.1 F (36.7 C), Min:97.7 F (36.5 C), Max:98.4 F (36.9 C) Propofol: None  Intake/Output Summary (Last 24 hours) at 03/03/17 1051 Last data filed at 03/03/17 0855  Gross per 24 hour  Intake          6998.75 ml  Output             1635 ml  Net          5363.75 ml  15L Fluid Positive MAP: 80  Medications reviewed and include:  Vitamin D, B12 Novolog 0-9 units Q4H NS @ 18800mL/hr Fentanyl gtt, Levo gtt, Neo gtt Dulcolax PRN  Labs reviewed:  CBGs: 159-173 BUN/Creatinine 51/2.00   Diet Order:     Skin:  Wound (see comment) (closed incision abdomen)  Last BM:  02/28/2017  Height:   Ht Readings from  Last 1 Encounters:  03/01/17 5\' 8"  (1.727 m)    Weight:   Wt Readings from Last 1 Encounters:  03/01/17 216 lb 14.9 oz (98.4 kg)    Ideal Body Weight:  70 kg  BMI:  Body mass index is 32.98 kg/m.  Estimated Nutritional Needs:   Kcal:  [id number] (11-14 kcal/kg)  Protein:  >/= 140 grams (>/= 2 g/kg IBW)  Fluid:  1.8 L/day (25 ml/kg IBW)  EDUCATION NEEDS:   No education needs identified at this time  Dionne AnoWilliam M. Stuti Sandin, MS, RD LDN Inpatient Clinical Dietitian Pager [phone]

## 2017-03-03 NOTE — Progress Notes (Signed)
[facility] - [place] at [facility]   PATIENT NAME: Alvin [name]    MR#:  [id number]  DATE OF BIRTH:  12/14/1940  SUBJECTIVE:  CHIEF COMPLAINT:   Chief Complaint  Patient presents with  . Shortness of Breath     Came with septic shock due to small bowel ischemia, on vent and vasopressors support.     Improving BP, now on only one pressor and tapering that.  REVIEW OF SYSTEMS:  Intubated on sedation, can not give ROS.  ROS  DRUG ALLERGIES:  No Known Allergies  VITALS:  Blood pressure 108/73, pulse 76, temperature 98.6 F (37 C), temperature source Axillary, resp. rate 19, height 5\' 8"  (1.727 m), weight 112.1 kg (247 lb 2.2 oz), SpO2 99 %.  PHYSICAL EXAMINATION:  GENERAL:  76 y.o.-year-old patient lying in the bed with critical appearance.  EYES: Pupils equal, round, reactive to light and accommodation. No scleral icterus.  HEENT: Head atraumatic, normocephalic. Oropharynx and nasopharynx clear.  NECK:  Supple, no jugular venous distention. No thyroid enlargement, no tenderness. ETT in place. LUNGS: Normal breath sounds bilaterally, no wheezing, rales,rhonchi or crepitation. No use of accessory muscles of respiration. On vent support. CARDIOVASCULAR: S1, S2 normal. No murmurs, rubs, or gallops.  ABDOMEN: Soft, nontender, some distended. Bowel sounds sluggish. No organomegaly or mass.  EXTREMITIES: No pedal edema, cyanosis, or clubbing.  NEUROLOGIC: sedated with ventilatory support. PSYCHIATRIC: The patient is sedated.  SKIN: No obvious rash, lesion, or ulcer.   Physical Exam LABORATORY PANEL:   CBC  Recent Labs Lab 03/03/17 0430  WBC 29.0*  HGB 12.3*  HCT 36.5*  PLT 144*   ------------------------------------------------------------------------------------------------------------------  Chemistries   Recent Labs Lab 03/02/17 0500 03/03/17 0430  NA 136 138  K 4.6 4.8  CL 108 114*  CO2 17* 18*  GLUCOSE 179* 181*  BUN 62* 51*  CREATININE  3.03* 2.00*  CALCIUM 6.8* 7.0*  MG  --  2.1  AST 62*  --   ALT 32  --   ALKPHOS 45  --   BILITOT 0.7  --    ------------------------------------------------------------------------------------------------------------------  Cardiac Enzymes  Recent Labs Lab 03/01/17 0801 03/01/17 1356  TROPONINI 0.09* 0.09*   ------------------------------------------------------------------------------------------------------------------  RADIOLOGY:  Dg Chest 1 View  Result Date: 03/03/2017 CLINICAL DATA:  Shortness of breath. EXAM: CHEST 1 VIEW COMPARISON:  03/01/2017. FINDINGS: Endotracheal tube, NG tube, right IJ line stable position. Cardiac pacer with lead tips in right atrium right ventricle. Cardiomegaly. Diffuse bilateral interstitial prominence with bilateral small pleural effusions. Low lung volumes with basilar atelectasis. No pneumothorax. IMPRESSION: 1. Lines and tubes in stable position. 2. Cardiac pacer stable position. Cardiomegaly with diffuse bilateral from interstitial prominence and small bilateral pleural effusions. Findings consistent with CHF. 3. Low lung volumes with basilar atelectasis. Electronically Signed   By: Maisie Fushomas  Register   On: 03/03/2017 06:46    ASSESSMENT AND PLAN:   Active Problems:   Severe sepsis with septic shock (HCC)   Acute respiratory failure with hypoxemia (HCC)   Abdominal distention   Lactic acidosis   Elevated lipase   Nonocclusive intestinal infarction (HCC)  #. Severe sepsis with septic shock and respi failure secondary to pneumonia. - intubated, vent support, needing vasopressors. Improving now. - IV antibiotics: Maxipime, Vanco - IV fluid hydration - Trend lactate - Follow up blood,urine & sputum cultures- bl have coag neg staph. - Repeat CBC in am.   #. Acute respiratory failure with hypoxia - vent support.  #. Abdominal distention rule  out bowel obstruction- ileus and ischemic bowel. - NPO - surgical consultation appreciated.  Resolving now.  #. Renal insufficiency, acute due to shock - IV fluids and repeat BMP in AM.  - Avoid nephrotoxic medications  renal func improving.  #. Elevated troponin, likely secondary to demand ischemia - Trend troponins - Monitor on telemetry  #. Elevated lipase - Follow up   #. History of BPH  #. History of Bipolar disorder  # bowel ischemia   laprotomy was done 03/01/17, but ischemia resolved immediately ( after removing intestinal loops from incision), so no resection done.   Monitor.  All the records are reviewed and case discussed with Care Management/Social Workerr. Management plans discussed with the patient, family and they are in agreement.  CODE STATUS: Full.  TOTAL TIME TAKING CARE OF THIS PATIENT: 35 minutes.   Prognosis very poor.  POSSIBLE D/C IN 2-3 DAYS, DEPENDING ON CLINICAL CONDITION.   Altamese DillingVACHHANI, Jeyli Zwicker M.D on 03/03/2017   Between 7am to 6pm - Pager - [phone]  After 6pm go to [email or web address] - password [email or web address]  [facility]  Office  [phone]  CC: Primary care physician; System, Pcp Not In  Note: This dictation was prepared with Dragon dictation along with smaller phrase technology. Any transcriptional errors that result from this process are unintentional.

## 2017-03-04 ENCOUNTER — Inpatient Hospital Stay: Payer: Medicare Other

## 2017-03-04 DIAGNOSIS — K55069 Acute infarction of intestine, part and extent unspecified: Secondary | ICD-10-CM

## 2017-03-04 LAB — COMPREHENSIVE METABOLIC PANEL
ALBUMIN: 1.7 g/dL — AB (ref 3.5–5.0)
ALT: 36 U/L (ref 17–63)
ANION GAP: 4 — AB (ref 5–15)
AST: 58 U/L — ABNORMAL HIGH (ref 15–41)
Alkaline Phosphatase: 68 U/L (ref 38–126)
BILIRUBIN TOTAL: 0.4 mg/dL (ref 0.3–1.2)
BUN: 44 mg/dL — ABNORMAL HIGH (ref 6–20)
CALCIUM: 7.3 mg/dL — AB (ref 8.9–10.3)
CO2: 19 mmol/L — ABNORMAL LOW (ref 22–32)
Chloride: 119 mmol/L — ABNORMAL HIGH (ref 101–111)
Creatinine, Ser: 1.45 mg/dL — ABNORMAL HIGH (ref 0.61–1.24)
GFR, EST AFRICAN AMERICAN: 53 mL/min — AB (ref >60)
GFR, EST NON AFRICAN AMERICAN: 46 mL/min — AB (ref >60)
GLUCOSE: 170 mg/dL — AB (ref 65–99)
POTASSIUM: 4.1 mmol/L (ref 3.5–5.1)
Sodium: 142 mmol/L (ref 135–145)
TOTAL PROTEIN: 4.5 g/dL — AB (ref 6.5–8.1)

## 2017-03-04 LAB — MAGNESIUM: Magnesium: 2.4 mg/dL (ref 1.7–2.4)

## 2017-03-04 LAB — BLOOD GAS, ARTERIAL
ACID-BASE DEFICIT: 8.6 mmol/L — AB (ref 0.0–2.0)
BICARBONATE: 16.5 mmol/L — AB (ref 20.0–28.0)
FIO2: 0.4
LHR: 22 {breaths}/min
O2 Saturation: 95.3 %
PCO2 ART: 32 mmHg (ref 32.0–48.0)
PEEP/CPAP: 5 cmH2O
PH ART: 7.32 — AB (ref 7.350–7.450)
PO2 ART: 84 mmHg (ref 83.0–108.0)
Patient temperature: 37
VT: 550 mL

## 2017-03-04 LAB — CBC
HEMATOCRIT: 37 % — AB (ref 40.0–52.0)
Hemoglobin: 12.3 g/dL — ABNORMAL LOW (ref 13.0–18.0)
MCH: 33.6 pg (ref 26.0–34.0)
MCHC: 33.2 g/dL (ref 32.0–36.0)
MCV: 101.1 fL — ABNORMAL HIGH (ref 80.0–100.0)
Platelets: 107 10*3/uL — ABNORMAL LOW (ref 150–440)
RBC: 3.66 MIL/uL — ABNORMAL LOW (ref 4.40–5.90)
RDW: 14.3 % (ref 11.5–14.5)
WBC: 26.7 10*3/uL — ABNORMAL HIGH (ref 3.8–10.6)

## 2017-03-04 LAB — GLUCOSE, CAPILLARY
GLUCOSE-CAPILLARY: 104 mg/dL — AB (ref 65–99)
GLUCOSE-CAPILLARY: 118 mg/dL — AB (ref 65–99)
GLUCOSE-CAPILLARY: 141 mg/dL — AB (ref 65–99)
GLUCOSE-CAPILLARY: 156 mg/dL — AB (ref 65–99)
Glucose-Capillary: 125 mg/dL — ABNORMAL HIGH (ref 65–99)
Glucose-Capillary: 155 mg/dL — ABNORMAL HIGH (ref 65–99)
Glucose-Capillary: 191 mg/dL — ABNORMAL HIGH (ref 65–99)

## 2017-03-04 LAB — CULTURE, BLOOD (ROUTINE X 2): Special Requests: ADEQUATE

## 2017-03-04 LAB — TRIGLYCERIDES: TRIGLYCERIDES: 185 mg/dL — AB (ref <150)

## 2017-03-04 LAB — PHOSPHORUS: Phosphorus: 2.6 mg/dL (ref 2.5–4.6)

## 2017-03-04 LAB — LACTIC ACID, PLASMA: LACTIC ACID, VENOUS: 1.3 mmol/L (ref 0.5–1.9)

## 2017-03-04 MED ORDER — TRACE MINERALS CR-CU-MN-SE-ZN 10-1000-500-60 MCG/ML IV SOLN
INTRAVENOUS | Status: DC
  Filled 2017-03-04: qty 960

## 2017-03-04 MED ORDER — TRACE MINERALS CR-CU-MN-SE-ZN 10-1000-500-60 MCG/ML IV SOLN
INTRAVENOUS | Status: AC
  Administered 2017-03-04: 18:00:00 via INTRAVENOUS
  Filled 2017-03-04: qty 960

## 2017-03-04 MED ORDER — HYDROCORTISONE NA SUCCINATE PF 100 MG IJ SOLR
25.0000 mg | Freq: Every day | INTRAMUSCULAR | Status: DC
  Administered 2017-03-04 – 2017-03-06 (×3): 25 mg via INTRAVENOUS
  Filled 2017-03-04 (×3): qty 2

## 2017-03-04 MED ORDER — INSULIN ASPART 100 UNIT/ML ~~LOC~~ SOLN: 0.0000 [IU] | SUBCUTANEOUS | Status: DC

## 2017-03-04 MED ORDER — VITAL HIGH PROTEIN PO LIQD
1000.0000 mL | ORAL | Status: DC
  Administered 2017-03-04 (×2): 1000 mL
  Administered 2017-03-05 (×2)

## 2017-03-04 NOTE — Progress Notes (Signed)
PHARMACY - ADULT TOTAL PARENTERAL NUTRITION CONSULT NOTE   Pharmacy Consult for TPN Indication: ischemic bowel  Patient Measurements: Height: 5\' 8"  (172.7 cm) Weight: 248 lb 10.9 oz (112.8 kg) IBW/kg (Calculated) : 68.4 TPN AdjBW (KG): 75.9 Body mass index is 37.81 kg/m.  Assessment:  GI:  Endo:  Insulin requirements in the past 24 hours:  Lytes: WNL Renal: Pulm: Cards:  Hepatobil: Neuro: ID:  Best Practices: Patient has a central line - confirmed with RN TPN Access: TPN start date: 7/28  Plan:  5/15 Clinimix with E at 40 ml/hr Hold 20% lipid emulsion for first 7 days for ICU patients per ASPEN guidelines Patient has orders for NaCl at 100 mL/hr  MVI and trace elements in TPN 0-9 units SSI q4h and adjust as needed  No electrolyte supplementation needed at this time. Labs have been ordered. Will recheck electrolytes with AM labs tomorrow.  Cindi CarbonMary M Romy Mcgue, PharmD Clinical Pharmacist 03/04/2017,1:45 PM

## 2017-03-04 NOTE — Progress Notes (Signed)
[facility] - [place] at [facility]   PATIENT NAME: Alvin PeckJohn [name]    MR#:  [id number]  DATE OF BIRTH:  09/12/1940  SUBJECTIVE:   Patient here due to suspected ischemic colitis and status post exploratory laparotomy which showed no evidence of bowel ischemia, but now in severe sepsis and septic shock. Remains on 1 vasopressor, attempted to wean off the vent today but Patient became agitated.  REVIEW OF SYSTEMS:    Review of Systems  Unable to perform ROS: Intubated    Nutrition: Tube Feeds Tolerating Diet: Yes Tolerating PT: Await Eval  DRUG ALLERGIES:  No Known Allergies  VITALS:  Blood pressure 93/65, pulse 75, temperature 97.7 F (36.5 C), temperature source Axillary, resp. rate (!) 8, height 5\' 8"  (1.727 m), weight 112.8 kg (248 lb 10.9 oz), SpO2 96 %.  PHYSICAL EXAMINATION:   Physical Exam  GENERAL:  76 y.o.-year-old patient lying in bed sedated & Intubated.  EYES: Right Pupil if fixed and dilated and not responsive to light. No scleral icterus.  HEENT: Head atraumatic, normocephalic. ET and OG tubes in place.  NECK:  Supple, no jugular venous distention. No thyroid enlargement, no tenderness.  LUNGS: Normal breath sounds bilaterally, no wheezing, rales, rhonchi. No use of accessory muscles of respiration.  CARDIOVASCULAR: S1, S2 normal. No murmurs, rubs, or gallops.  ABDOMEN: Soft, nontender, nondistended. Bowel sounds Hypoactive but present. No organomegaly or mass.  EXTREMITIES: No cyanosis, clubbing or edema b/l.    NEUROLOGIC: Sedated & intubated PSYCHIATRIC: Sedated & intubated.   SKIN: No obvious rash, lesion, or ulcer.    LABORATORY PANEL:   CBC  Recent Labs Lab 03/04/17 0440  WBC 26.7*  HGB 12.3*  HCT 37.0*  PLT 107*   ------------------------------------------------------------------------------------------------------------------  Chemistries   Recent Labs Lab 03/04/17 0440  NA 142  K 4.1  CL 119*  CO2 19*  GLUCOSE 170*   BUN 44*  CREATININE 1.45*  CALCIUM 7.3*  MG 2.4  AST 58*  ALT 36  ALKPHOS 68  BILITOT 0.4   ------------------------------------------------------------------------------------------------------------------  Cardiac Enzymes  Recent Labs Lab 03/01/17 1356  TROPONINI 0.09*   ------------------------------------------------------------------------------------------------------------------  RADIOLOGY:  Dg Chest 1 View  Result Date: 03/04/2017 CLINICAL DATA:  Dyspnea. EXAM: CHEST 1 VIEW COMPARISON:  03/03/2017 FINDINGS: Endotracheal tube terminates 3.3 cm above the carina. Right jugular catheter terminates over the lower SVC. 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 The cardiac silhouette is mildly enlarged. There is pulmonary vascular congestion with patchy perihilar and basilar opacity bilaterally, stable to slightly increased. Small bilateral pleural effusions are stable to slightly increased. No pneumothorax is identified. IMPRESSION: 1. Cardiomegaly and pulmonary vascular congestion with stable to slightly increased bibasilar opacities which may reflect edema and atelectasis. 2. Persistent small pleural effusions. Electronically Signed   By: Sebastian AcheAllen  Grady M.D.   On: 03/04/2017 07:10   Dg Chest 1 View  Result Date: 03/03/2017 CLINICAL DATA:  Shortness of breath. EXAM: CHEST 1 VIEW COMPARISON:  03/01/2017. FINDINGS: Endotracheal tube, NG tube, right IJ line stable position. Cardiac pacer with lead tips in right atrium right ventricle. Cardiomegaly. Diffuse bilateral interstitial prominence with bilateral small pleural effusions. Low lung volumes with basilar atelectasis. No pneumothorax. IMPRESSION: 1. Lines and tubes in stable position. 2. Cardiac pacer stable position. Cardiomegaly with diffuse bilateral from interstitial prominence and small bilateral pleural effusions. Findings consistent with CHF. 3. Low lung volumes with basilar atelectasis. Electronically Signed   By: Maisie Fushomas   Register   On: 03/03/2017 06:46   Dg  Abd 1 View  Result Date: 03/04/2017 CLINICAL DATA:  Abdominal distention EXAM: ABDOMEN - 1 VIEW COMPARISON:  03/01/2017 FINDINGS: NG tube tip is in the distal stomach. Nonobstructive bowel gas pattern. No free air or organomegaly. IMPRESSION: No acute findings.  NG tube tip in the distal stomach. Electronically Signed   By: Charlett NoseKevin  Dover M.D.   On: 03/04/2017 07:30     ASSESSMENT AND PLAN:   76 year old male with past medical history of hypertension, bipolar disorder, who presented to the hospital due to abdominal pain, nausea or diarrhea and suspected to have ischemic bowel and status post exploratory laparotomy which was negative for ischemic bowel patient now is severe sepsis with septic shock secondary to pneumonia.  1. Severe sepsis with septic shock-suspected to be secondary to pneumonia. -Continue IV vasopressors, lactate is trending down. Continue IV fluids, -Continue broad-spectrum IV antibiotics with Zosyn for now.  2. Acute respiratory failure-secondary to pneumonia/ARDS. -On 30% FiO2 and as per pulmonary attempt to wean off the vent but patient got agitated. Continue further weaning as per pulmonary.  3. Abdominal pain/distention-patient is status post exploratory laparotomy showing no evidence of ischemic bowel. -Currently on trickle tube feeds and tolerating it well. Started on TPN as per surgery. -Continue further care as per general surgery.  4. Elevated troponin-secondary to supply demand ischemia,acute coronary syndrome.  5. Leukocytosis-secondary to sepsis follow white cell count with antibiotic therapy treatment.  6. Hx of Bipolar Disorder - cont. Depakote.   7. Hyperlipidemia - cont. Atorvastatin  8. Hx of Gout - cont. Allopurinol.   All the records are reviewed and case discussed with Care Management/Social Worker. Management plans discussed with the patient, family and they are in agreement.  CODE STATUS: DNR  DVT  Prophylaxis: Hep SQ  TOTAL TIME TAKING CARE OF THIS PATIENT: 30 minutes.   POSSIBLE D/C unclear, DEPENDING ON CLINICAL CONDITION.   [name],Kylieann Eagles J M.D on 03/04/2017 at 1:02 PM  Between 7am to 6pm - Pager - [phone]  After 6pm go to [email or web address] - Social research officer, governmentpassword [email or web address]  [facility]  Office  [phone]  CC: Primary care physician; System, Pcp Not In

## 2017-03-04 NOTE — Progress Notes (Signed)
3 Days Post-Op  Subjective: Status post exploratory laparotomy. Patient remains on a ventilator and remains with acidosis but a normal lactate level. He is requiring pressors but has improved. This been discussed with nursing.  Objective: Vital signs in last 24 hours: Temp:  [97.7 F (36.5 C)-98.8 F (37.1 C)] 97.7 F (36.5 C) (07/28 0800) Pulse Rate:  [60-79] 66 (07/28 0830) Resp:  [0-25] 22 (07/28 0830) BP: (59-163)/(30-106) 95/67 (07/28 0830) SpO2:  [93 %-100 %] 100 % (07/28 0830) Arterial Line BP: (58-131)/(37-75) 109/57 (07/28 0830) FiO2 (%):  [40 %-50 %] 40 % (07/28 0341) Weight:  [247 lb 2.2 oz (112.1 kg)-248 lb 10.9 oz (112.8 kg)] 248 lb 10.9 oz (112.8 kg) (07/28 0442) Last BM Date: 03/03/17  Intake/Output from previous day: 07/27 0701 - 07/28 0700 In: 4361.4 [I.V.:3856.8; NG/GT:94.6; IV Piggyback:350] Out: 1895 [Urine:1800; Stool:95] Intake/Output this shift: Total I/O In: 27.5 [I.V.:27.5] Out: -   Physical exam:  Distended and tympanitic nontender patient is awake on the ventilator. Vital signs are reviewed. On pressors. Wound is clean.  Lab Results: CBC   Recent Labs  03/03/17 0430 03/04/17 0440  WBC 29.0* 26.7*  HGB 12.3* 12.3*  HCT 36.5* 37.0*  PLT 144* 107*   BMET  Recent Labs  03/03/17 0430 03/04/17 0440  NA 138 142  K 4.8 4.1  CL 114* 119*  CO2 18* 19*  GLUCOSE 181* 170*  BUN 51* 44*  CREATININE 2.00* 1.45*  CALCIUM 7.0* 7.3*   PT/INR No results for input(s): LABPROT, INR in the last 72 hours. ABG  Recent Labs  03/03/17 0330 03/04/17 0340  PHART 7.27* 7.32*  HCO3 17.0* 16.5*    Studies/Results: Dg Chest 1 View  Result Date: 03/04/2017 CLINICAL DATA:  Dyspnea. EXAM: CHEST 1 VIEW COMPARISON:  03/03/2017 FINDINGS: Endotracheal tube terminates 3.3 cm above the carina. Right jugular catheter terminates over the lower SVC. 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 The cardiac silhouette is mildly enlarged. There is pulmonary vascular  congestion with patchy perihilar and basilar opacity bilaterally, stable to slightly increased. Small bilateral pleural effusions are stable to slightly increased. No pneumothorax is identified. IMPRESSION: 1. Cardiomegaly and pulmonary vascular congestion with stable to slightly increased bibasilar opacities which may reflect edema and atelectasis. 2. Persistent small pleural effusions. Electronically Signed   By: Sebastian AcheAllen  Grady M.D.   On: 03/04/2017 07:10   Dg Chest 1 View  Result Date: 03/03/2017 CLINICAL DATA:  Shortness of breath. EXAM: CHEST 1 VIEW COMPARISON:  03/01/2017. FINDINGS: Endotracheal tube, NG tube, right IJ line stable position. Cardiac pacer with lead tips in right atrium right ventricle. Cardiomegaly. Diffuse bilateral interstitial prominence with bilateral small pleural effusions. Low lung volumes with basilar atelectasis. No pneumothorax. IMPRESSION: 1. Lines and tubes in stable position. 2. Cardiac pacer stable position. Cardiomegaly with diffuse bilateral from interstitial prominence and small bilateral pleural effusions. Findings consistent with CHF. 3. Low lung volumes with basilar atelectasis. Electronically Signed   By: Maisie Fushomas  Register   On: 03/03/2017 06:46   Dg Abd 1 View  Result Date: 03/04/2017 CLINICAL DATA:  Abdominal distention EXAM: ABDOMEN - 1 VIEW COMPARISON:  03/01/2017 FINDINGS: NG tube tip is in the distal stomach. Nonobstructive bowel gas pattern. No free air or organomegaly. IMPRESSION: No acute findings.  NG tube tip in the distal stomach. Electronically Signed   By: Charlett NoseKevin  Dover M.D.   On: 03/04/2017 07:30    Anti-infectives: Anti-infectives    Start     Dose/Rate Route Frequency Ordered Stop  03/02/17 1400  piperacillin-tazobactam (ZOSYN) IVPB 3.375 g     3.375 g 12.5 mL/hr over 240 Minutes Intravenous Every 8 hours 03/02/17 0855     03/01/17 1800  ceFEPIme (MAXIPIME) 2 g in dextrose 5 % 50 mL IVPB  Status:  Discontinued     2 g 100 mL/hr over 30  Minutes Intravenous Every 24 hours 03/01/17 0250 03/01/17 0423   03/01/17 0600  vancomycin (VANCOCIN) IVPB 1000 mg/200 mL premix  Status:  Discontinued     1,000 mg 200 mL/hr over 60 Minutes Intravenous Every 48 hours 03/01/17 0250 03/01/17 1125   03/01/17 0600  piperacillin-tazobactam (ZOSYN) IVPB 3.375 g  Status:  Discontinued     3.375 g 12.5 mL/hr over 240 Minutes Intravenous Every 6 hours 03/01/17 0423 03/01/17 0434   03/01/17 0445  piperacillin-tazobactam (ZOSYN) IVPB 3.375 g  Status:  Discontinued     3.375 g 12.5 mL/hr over 240 Minutes Intravenous Every 12 hours 03/01/17 0434 03/02/17 0855   02/28/17 2215  ceFEPIme (MAXIPIME) 2 g in dextrose 5 % 50 mL IVPB     2 g 100 mL/hr over 30 Minutes Intravenous  Once 02/28/17 2203 02/28/17 2243   02/28/17 2215  vancomycin (VANCOCIN) IVPB 1000 mg/200 mL premix     1,000 mg 200 mL/hr over 60 Minutes Intravenous  Once 02/28/17 2203 02/28/17 2316      Assessment/Plan: s/p Procedure(s): EXPLORATORY LAPAROTOMY   Elevated white blood cell count with depressed lead pressure of unclear etiology. Patient remains acidotic with a bicarbonate of 19. Lactate level is 1.3. Abdomen is distended and tympanitic but nontender.  Patient has had his low volume tube feeds stop this morning possibly due to his distention. I am in agreement with this although minimal elemental tube feeds would not be a problem in this patient at this time. He will require ongoing nutritional needs and should have a PICC line and TPN started. I have discussed this with nursing in place orders for consultation.  Lattie Hawichard E Arieh Bogue, MD, Carilyn GoodpastureFACS  03/04/2017

## 2017-03-04 NOTE — Progress Notes (Addendum)
Brief Nutrition Note:  Dietitian Consult received for TPN   Abdomen very distended this AM. Vital High Protein at rate of 25 ml/hr held due to distention. Abdominal xray report indicating non obstructive bowel gas pattern with no acute findings.   Discussed plan of resuming enteral nutrition with MD Excell Seltzerooper. MD agrees that this is reasonable and recommends resuming at 10-15 ml/hr. MD also concerned regarding nutritional status, pt NPO for 3 days prior with poor nutritional intake PTA and presumes pt will require TPN for at least several days. Plan to initiate TPN today. PICC line to be placed.   UOP 1800 mL in past 24 hours, NS at 100 ml/hr. Pt only on phenylephrine for BP support at present, MAP >65, diastolic pressure >50  Weight up significantly since previous assessment, non pitting edema present. Per I/O flow sheet, pt net positive 17.5 L (?) which is equivalent to 17.5 kg (38.5 pounds). Weight on admission 216 pounds, current wt 248 pounds. Weight gain presumably fluid gain at present. Continue to use weigh of 216 pounds as presumed Dry Weight. >70% of protein needs.  Labs: CBGs 104-156, Creatnine 1.45, BUN 44, sodium wdl, potassium wdl, magnesium wdl, phosphorus wdl, corrected calcium 9.14, albumin 1.7 Meds: reviewed, ss novolog q 4 hours  Recommend starting 5%AA/15%Dextrose with Electrolytes at rate of 40 ml/hr today; goal rate of 83 ml/hr providing 1414 kcals, 100 g of protein and 1992 mL of fluid. Will need to assess total IVF on follow as TPN advanced to goal rate. Hold on ILE for the first week of ICU admission per ASPEN guidelines. Meets 100% of estimated calore needs, Goal to meet at least 80%.   Romelle Starcherate Brentlee Sciara MS, RD, LDN [phone] Pager  [phone] Weekend/On-Call Pager

## 2017-03-04 NOTE — Therapy (Signed)
Patient weaned on PSV 10/5 for 3 hrs before showing signs of fatigue. Mild increased WOB, slight agitation. Suctioned for large amount of thick tan secretions with one plug noted.

## 2017-03-04 NOTE — Progress Notes (Signed)
RN called and spoke with Dr. Nicholos Johnsamachandran and made MD aware that patient is in pressure support on ventilator and tolerating well but restless and agitated in bed with red face and fentanyl drip has been off.  MD instructed RN to restart fentanyl drip and get RT to place patient back on a rate.

## 2017-03-04 NOTE — Progress Notes (Signed)
[facility]  Critical Care Medicine Progess Note    SYNOPSIS   76 year old male with pneumonia and ischemic bowel, status post ex-lap on 7/25. No bowel resection undertaken. Course complicated by severe septic shock, high O2 requirements, acute renal failure.  ASSESSMENT/PLAN    PULMONARY A:Acute hypoxic respiratory failure, vent dependent. I personally reviewed. Chest x-ray imaging, most recent chest x-ray 7/28 showed continued bibasilar atelectasis, otherwise unremarkable lungs. -Blood gas 7.32/32/84/16.5 consistent with mild metabolic acidosis -Intubated 7/25>> P:   -Settings reviewed as below, we'll continue current settings.  VENTILATOR SETTINGS: Vent Mode: PRVC FiO2 (%):  [40 %-50 %] 40 % Set Rate:  [22 bmp] 22 bmp Vt Set:  [550 mL] 550 mL PEEP:  [5 cmH20] 5 cmH20 Plateau Pressure:  [17 cmH20-18 cmH20] 17 cmH20  CARDIOVASCULAR A: Severe septic shock with hypotension. Currently on  phenylephrine at 60 mics. P:  Continue pressors, wean down as tolerated.  HEMODYNAMICS:    RENAL A:  Acute renal failure secondary to above. P:   -Creatinine and urine output improving. INTAKE / OUTPUT:  Intake/Output Summary (Last 24 hours) at 03/04/17 0814 Last data filed at 03/04/17 0800  Gross per 24 hour  Intake          4388.89 ml  Output             1895 ml  Net          2493.89 ml    GASTROINTESTINAL A:  Postop day 3 from exploratory laparoscopy with immediate closure. Ileus. P:   Surgery following,  Famotidine.  Hold Tube feeds.  HEMATOLOGIC A:  Leukocytosis, likely reactive to sepsis and stress dose steroids.  P:  -Continue to monitor. --Decrease steroid dose. Again today.   INFECTIOUS A:  Severe septic shock, suspect abdominal source and/or pulmonary. P:   Continue current antibiotic  Micro/culture results:  BCx2 7/24: Negative UC 7/24: Negative Sputum-- MRSA PCR 7/25: Negative. Procalcitonin 3.58>> 13.81>>11.35, consistent with bacterial  infection.  Antibiotics: Vancomycin, cefepime 7/24  >> 7/25 7/25 Zosyn >>  ENDOCRINE A: Minimal hyperglycemia.  P:   Continue SSI  NEUROLOGIC A:  Sedated.  P:   RASS goal: -1   MAJOR EVENTS/TEST RESULTS: 7/25 Patient sent to the ICU  with shortness of breath secondary to Pneumonia.   7/25; exploratory laparoscopy, with no bowel resection.  Best Practices  DVT Prophylaxis: heparin SQ GI Prophylaxis: Famotidine.    ---------------------------------------   ----------------------------------------   Name: Alvin [name] MRN: [id number] DOB: 09/15/1940    ADMISSION DATE:  02/28/2017    SUBJECTIVE:   Pt currently on the ventilator, can not provide history or review of systems.   Review of Systems:  --  VITAL SIGNS: Temp:  [97.7 F (36.5 C)-98.8 F (37.1 C)] 97.7 F (36.5 C) (07/28 0800) Pulse Rate:  [60-79] 63 (07/28 0800) Resp:  [0-25] 22 (07/28 0800) BP: (59-163)/(30-106) 91/61 (07/28 0800) SpO2:  [93 %-100 %] 99 % (07/28 0800) Arterial Line BP: (58-131)/(37-75) 100/53 (07/28 0800) FiO2 (%):  [40 %-50 %] 40 % (07/28 0341) Weight:  [247 lb 2.2 oz (112.1 kg)-248 lb 10.9 oz (112.8 kg)] 248 lb 10.9 oz (112.8 kg) (07/28 0442)     PHYSICAL EXAMINATION: Physical Examination:   VS: BP 91/61   Pulse 63   Temp 97.7 F (36.5 C) (Axillary)   Resp (!) 22   Ht 5\' 8"  (1.727 m)   Wt 248 lb 10.9 oz (112.8 kg)   SpO2 99%   BMI 37.81 kg/m   General  Appearance: No distress , sedated Neuro:without focal findings, mental status reduced HEENT: PERRLA, EOM intact. Pulmonary: reduced bilateral breath sounds   CardiovascularNormal S1,S2.  No m/r/g.   Abdomen: Benign, Soft, non-tender. Renal:  No costovertebral tenderness  GU:  Not performed at this time. Endocrine: No evident thyromegaly. Skin:   warm, no rashes, no ecchymosis  Extremities: normal, no cyanosis, clubbing.    LABORATORY PANEL:   CBC  Recent Labs Lab 03/04/17 0440  WBC 26.7*  HGB 12.3*    HCT 37.0*  PLT 107*    Chemistries   Recent Labs Lab 03/04/17 0440  NA 142  K 4.1  CL 119*  CO2 19*  GLUCOSE 170*  BUN 44*  CREATININE 1.45*  CALCIUM 7.3*  MG 2.4  PHOS 2.6  AST 58*  ALT 36  ALKPHOS 68  BILITOT 0.4     Recent Labs Lab 03/03/17 1202 03/03/17 1553 03/03/17 1949 03/04/17 0002 03/04/17 0347 03/04/17 0737  GLUCAP 149* 133* 123* 104* 156* 125*    Recent Labs Lab 03/01/17 0500 03/03/17 0330 03/04/17 0340  PHART 7.16* 7.27* 7.32*  PCO2ART 56* 37 32  PO2ART 69* 73* 84    Recent Labs Lab 03/01/17 0330 03/02/17 0500 03/04/17 0440  AST 56* 62* 58*  ALT 33 32 36  ALKPHOS 58 45 68  BILITOT 0.9 0.7 0.4  ALBUMIN 2.7* 1.9* 1.7*    Cardiac Enzymes  Recent Labs Lab 03/01/17 1356  TROPONINI 0.09*    RADIOLOGY:  Dg Chest 1 View  Result Date: 03/04/2017 CLINICAL DATA:  Dyspnea. EXAM: CHEST 1 VIEW COMPARISON:  03/03/2017 FINDINGS: Endotracheal tube terminates 3.3 cm above the carina. Right jugular catheter terminates over the lower SVC. 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 The cardiac silhouette is mildly enlarged. There is pulmonary vascular congestion with patchy perihilar and basilar opacity bilaterally, stable to slightly increased. Small bilateral pleural effusions are stable to slightly increased. No pneumothorax is identified. IMPRESSION: 1. Cardiomegaly and pulmonary vascular congestion with stable to slightly increased bibasilar opacities which may reflect edema and atelectasis. 2. Persistent small pleural effusions. Electronically Signed   By: Sebastian Ache M.D.   On: 03/04/2017 07:10   Dg Chest 1 View  Result Date: 03/03/2017 CLINICAL DATA:  Shortness of breath. EXAM: CHEST 1 VIEW COMPARISON:  03/01/2017. FINDINGS: Endotracheal tube, NG tube, right IJ line stable position. Cardiac pacer with lead tips in right atrium right ventricle. Cardiomegaly. Diffuse bilateral interstitial prominence with bilateral small pleural effusions. Low  lung volumes with basilar atelectasis. No pneumothorax. IMPRESSION: 1. Lines and tubes in stable position. 2. Cardiac pacer stable position. Cardiomegaly with diffuse bilateral from interstitial prominence and small bilateral pleural effusions. Findings consistent with CHF. 3. Low lung volumes with basilar atelectasis. Electronically Signed   By: Maisie Fus  Register   On: 03/03/2017 06:46   Dg Abd 1 View  Result Date: 03/04/2017 CLINICAL DATA:  Abdominal distention EXAM: ABDOMEN - 1 VIEW COMPARISON:  03/01/2017 FINDINGS: NG tube tip is in the distal stomach. Nonobstructive bowel gas pattern. No free air or organomegaly. IMPRESSION: No acute findings.  NG tube tip in the distal stomach. Electronically Signed   By: Charlett Nose M.D.   On: 03/04/2017 07:30        --Wells Guiles, MD.  ICU Pager: [phone] [place] [facility] Office Number: [phone]   03/04/2017   Critical Care Attestation.  I have personally obtained a history, examined the patient, evaluated laboratory and imaging results, formulated the assessment and plan and  placed orders. The Patient requires high complexity decision making for assessment and support, frequent evaluation and titration of therapies, application of advanced monitoring technologies and extensive interpretation of multiple databases. The patient has critical illness that could lead imminently to failure of 1 or more organ systems and requires the highest level of physician preparedness to intervene.  Critical Care Time devoted to patient care services described in this note is 35 minutes and is exclusive of time spent in procedures supervisory time of NP.

## 2017-03-05 ENCOUNTER — Inpatient Hospital Stay: Payer: Medicare Other

## 2017-03-05 LAB — COMPREHENSIVE METABOLIC PANEL
ALT: 28 U/L (ref 17–63)
AST: 45 U/L — AB (ref 15–41)
Albumin: 1.5 g/dL — ABNORMAL LOW (ref 3.5–5.0)
Alkaline Phosphatase: 71 U/L (ref 38–126)
Anion gap: 2 — ABNORMAL LOW (ref 5–15)
BILIRUBIN TOTAL: 0.5 mg/dL (ref 0.3–1.2)
BUN: 44 mg/dL — AB (ref 6–20)
CO2: 19 mmol/L — ABNORMAL LOW (ref 22–32)
CREATININE: 1.32 mg/dL — AB (ref 0.61–1.24)
Calcium: 7.3 mg/dL — ABNORMAL LOW (ref 8.9–10.3)
Chloride: 122 mmol/L — ABNORMAL HIGH (ref 101–111)
GFR calc Af Amer: 59 mL/min — ABNORMAL LOW (ref >60)
GFR, EST NON AFRICAN AMERICAN: 51 mL/min — AB (ref >60)
Glucose, Bld: 185 mg/dL — ABNORMAL HIGH (ref 65–99)
Potassium: 3.4 mmol/L — ABNORMAL LOW (ref 3.5–5.1)
Sodium: 143 mmol/L (ref 135–145)
TOTAL PROTEIN: 4.1 g/dL — AB (ref 6.5–8.1)

## 2017-03-05 LAB — PHOSPHORUS: Phosphorus: 2.2 mg/dL — ABNORMAL LOW (ref 2.5–4.6)

## 2017-03-05 LAB — BLOOD GAS, ARTERIAL
Acid-base deficit: 5.8 mmol/L — ABNORMAL HIGH (ref 0.0–2.0)
BICARBONATE: 18.5 mmol/L — AB (ref 20.0–28.0)
FIO2: 0.25
Mode: POSITIVE
O2 Saturation: 89.3 %
PATIENT TEMPERATURE: 37
PEEP: 5 cmH2O
PO2 ART: 59 mmHg — AB (ref 83.0–108.0)
PRESSURE SUPPORT: 5 cmH2O
pCO2 arterial: 32 mmHg (ref 32.0–48.0)
pH, Arterial: 7.37 (ref 7.350–7.450)

## 2017-03-05 LAB — DIFFERENTIAL
Basophils Absolute: 0.1 10*3/uL (ref 0–0.1)
Basophils Relative: 1 %
EOS PCT: 0 %
Eosinophils Absolute: 0 10*3/uL (ref 0–0.7)
LYMPHS ABS: 0.7 10*3/uL — AB (ref 1.0–3.6)
LYMPHS PCT: 4 %
MONO ABS: 1.4 10*3/uL — AB (ref 0.2–1.0)
MONOS PCT: 8 %
Neutro Abs: 16.1 10*3/uL — ABNORMAL HIGH (ref 1.4–6.5)
Neutrophils Relative %: 87 %

## 2017-03-05 LAB — MAGNESIUM: Magnesium: 2.5 mg/dL — ABNORMAL HIGH (ref 1.7–2.4)

## 2017-03-05 LAB — GLUCOSE, CAPILLARY
GLUCOSE-CAPILLARY: 167 mg/dL — AB (ref 65–99)
Glucose-Capillary: 139 mg/dL — ABNORMAL HIGH (ref 65–99)
Glucose-Capillary: 135 mg/dL — ABNORMAL HIGH (ref 65–99)
Glucose-Capillary: 135 mg/dL — ABNORMAL HIGH (ref 65–99)
Glucose-Capillary: 142 mg/dL — ABNORMAL HIGH (ref 65–99)
Glucose-Capillary: 175 mg/dL — ABNORMAL HIGH (ref 65–99)

## 2017-03-05 LAB — TRIGLYCERIDES: Triglycerides: 188 mg/dL — ABNORMAL HIGH (ref <150)

## 2017-03-05 LAB — CBC
HCT: 36.1 % — ABNORMAL LOW (ref 40.0–52.0)
HEMOGLOBIN: 12 g/dL — AB (ref 13.0–18.0)
MCH: 32.8 pg (ref 26.0–34.0)
MCHC: 33.4 g/dL (ref 32.0–36.0)
MCV: 98.3 fL (ref 80.0–100.0)
PLATELETS: 101 10*3/uL — AB (ref 150–440)
RBC: 3.67 MIL/uL — AB (ref 4.40–5.90)
RDW: 14.5 % (ref 11.5–14.5)
WBC: 18.4 10*3/uL — ABNORMAL HIGH (ref 3.8–10.6)

## 2017-03-05 LAB — PREALBUMIN: Prealbumin: <5 mg/dL — ABNORMAL LOW (ref 18–38)

## 2017-03-05 MED ORDER — POTASSIUM CHLORIDE 10 MEQ/50ML IV SOLN
10.0000 meq | INTRAVENOUS | Status: DC
  Filled 2017-03-05 (×2): qty 50

## 2017-03-05 MED ORDER — OLANZAPINE 10 MG IM SOLR
15.0000 mg | Freq: Every day | INTRAMUSCULAR | Status: DC
  Administered 2017-03-05: 15 mg via INTRAMUSCULAR
  Filled 2017-03-05: qty 20

## 2017-03-05 MED ORDER — SODIUM CHLORIDE 0.9 % IV SOLN
Freq: Once | INTRAVENOUS | Status: AC
  Administered 2017-03-05: 12:00:00 via INTRAVENOUS
  Filled 2017-03-05: qty 50

## 2017-03-05 MED ORDER — SODIUM CHLORIDE 0.9 % IV SOLN
Freq: Once | INTRAVENOUS | Status: AC
  Administered 2017-03-05: 10:00:00 via INTRAVENOUS
  Filled 2017-03-05: qty 50

## 2017-03-05 MED ORDER — VALPROATE SODIUM 500 MG/5ML IV SOLN
250.0000 mg | Freq: Three times a day (TID) | INTRAVENOUS | Status: DC
  Administered 2017-03-05 – 2017-03-09 (×12): 250 mg via INTRAVENOUS
  Filled 2017-03-05 (×16): qty 2.5

## 2017-03-05 MED ORDER — FUROSEMIDE 10 MG/ML IJ SOLN
20.0000 mg | Freq: Two times a day (BID) | INTRAMUSCULAR | Status: AC
  Administered 2017-03-05 (×2): 20 mg via INTRAVENOUS
  Filled 2017-03-05 (×2): qty 2

## 2017-03-05 MED ORDER — DEXMEDETOMIDINE HCL IN NACL 400 MCG/100ML IV SOLN
0.4000 ug/kg/h | INTRAVENOUS | Status: DC
  Administered 2017-03-05: 0.4 ug/kg/h via INTRAVENOUS
  Filled 2017-03-05 (×2): qty 100

## 2017-03-05 MED ORDER — TRACE MINERALS CR-CU-MN-SE-ZN 10-1000-500-60 MCG/ML IV SOLN
INTRAVENOUS | Status: AC
  Administered 2017-03-05: 18:00:00 via INTRAVENOUS
  Filled 2017-03-05: qty 960

## 2017-03-05 NOTE — Progress Notes (Signed)
Alert making eye contact.  Does not follow commands. Attempted to talk to daughter but could not make out words.  NSR per cardiac monitor. Off pressors entire shift.  Excellent urine output.  No respiratory distress but continues to sound audibly congested with PRN order for NT suction.  O2 sats 98-100% on 2L.

## 2017-03-05 NOTE — Progress Notes (Signed)
PHARMACY - ADULT TOTAL PARENTERAL NUTRITION CONSULT NOTE   Pharmacy Consult for TPN Indication: ischemic bowel  Patient Measurements: Height: 5\' 8"  (172.7 cm) Weight: 248 lb 10.9 oz (112.8 kg) IBW/kg (Calculated) : 68.4 TPN AdjBW (KG): 75.9 Body mass index is 37.81 kg/m.  Assessment:  GI:  Endo:  Insulin requirements in the past 24 hours:  Lytes: WNL Renal: Pulm: Cards:  Hepatobil: Neuro: ID:  Best Practices: Patient has a central line - confirmed with RN TPN Access: TPN start date: 7/28  Plan:   Patient had his central line IJ on the right dislodged. Patient TPN held and will be restarted @ 1800.  5/15 Clinimix with E at 40 ml/hr Hold 20% lipid emulsion for first 7 days for ICU patients per ASPEN guidelines Patient has orders for NaCl at 100 mL/hr  MVI and trace elements in TPN 0-9 units SSI q4h and adjust as needed  No electrolyte supplementation needed at this time. Labs have been ordered. Will recheck electrolytes with AM labs tomorrow.  Demetrius CharityJames,Maddelynn Moosman D, PharmD Clinical Pharmacist 03/05/2017,10:02 AM

## 2017-03-05 NOTE — Progress Notes (Signed)
[facility] [place] Critical Care Medicine Progess Note    SYNOPSIS   76 year old male with pneumonia and ischemic bowel, status post ex-lap on 7/25. No bowel resection undertaken. Course complicated by severe septic shock, high O2 requirements, acute renal failure.  ASSESSMENT/PLAN    PULMONARY A:Acute hypoxic respiratory failure, vent dependent. I personally reviewed. Chest x-ray imaging, most recent chest x-ray 7/28 showed continued bibasilar atelectasis, developing right pleural effusion, otherwise unremarkable lungs. -Blood gas 7.32/32/84/16.5 consistent with mild metabolic acidosis -Intubated 7/25>> P:   -Settings reviewed as below, we'll continue current settings. --Weaning trial today, will consider extubation if tolerated.   VENTILATOR SETTINGS: Vent Mode: PRVC FiO2 (%):  [30 %] 30 % Set Rate:  [22 bmp] 22 bmp Vt Set:  [550 mL] 550 mL PEEP:  [5 cmH20] 5 cmH20 Pressure Support:  [10 cmH20] 10 cmH20 Plateau Pressure:  [16 cmH20-17 cmH20] 16 cmH20  CARDIOVASCULAR A: Severe septic shock with hypotension. Currently on  phenylephrine at 50 mics. P:  Continue pressors, wean down as tolerated.  HEMODYNAMICS:    RENAL A:  Acute renal failure secondary to above, improving.  Volume overload with 3+ edema and severe scrotal edema.  P:   -Will start diuresis.   INTAKE / OUTPUT:  Intake/Output Summary (Last 24 hours) at 03/05/17 0820 Last data filed at 03/05/17 0700  Gross per 24 hour  Intake          3773.02 ml  Output             1425 ml  Net          2348.02 ml    GASTROINTESTINAL A:  Postop day 3 from exploratory laparoscopy with immediate closure. Ileus. P:   Surgery following,  Famotidine.  Trickle tube feeds.  Discussed with surgery, started on TPN.   HEMATOLOGIC A:  Leukocytosis, likely reactive to sepsis and stress dose steroids.  P:  -Continue to monitor. --Decrease steroid dose. Again today.   INFECTIOUS A:  Severe septic shock, suspect abdominal  source and/or pulmonary. P:   Continue current antibiotic  Micro/culture results:  BCx2 7/24: Negative UC 7/24: Negative Sputum-- MRSA PCR 7/25: Negative. Procalcitonin 3.58>> 13.81>>11.35, consistent with bacterial infection.  Antibiotics: Vancomycin, cefepime 7/24  >> 7/25 7/25 Zosyn >>  ENDOCRINE A: Minimal hyperglycemia.  P:   Continue SSI  NEUROLOGIC A:  Sedated.  P:   RASS goal: -1   MAJOR EVENTS/TEST RESULTS: 7/25 Patient sent to the ICU  with shortness of breath secondary to Pneumonia.   7/25; exploratory laparoscopy, with no bowel resection.  Best Practices  DVT Prophylaxis: heparin SQ GI Prophylaxis: Famotidine.    ---------------------------------------   ----------------------------------------   Name: Alvin [name] MRN: [id number] DOB: 11/01/1940    ADMISSION DATE:  02/28/2017    SUBJECTIVE:   Pt currently on the ventilator, can not provide history or review of systems.   Review of Systems:  --  VITAL SIGNS: Temp:  [97.8 F (36.6 C)-99.3 F (37.4 C)] 98.3 F (36.8 C) (07/29 0800) Pulse Rate:  [55-90] 78 (07/29 0800) Resp:  [8-26] 22 (07/29 0800) BP: (78-131)/(45-89) 115/62 (07/29 0800) SpO2:  [95 %-100 %] 99 % (07/29 0800) Arterial Line BP: (88-194)/(46-106) 131/65 (07/29 0800) FiO2 (%):  [30 %] 30 % (07/29 0400) Weight:  [248 lb 10.9 oz (112.8 kg)] 248 lb 10.9 oz (112.8 kg) (07/29 0500)     PHYSICAL EXAMINATION: Physical Examination:   VS: BP 115/62   Pulse 78   Temp 98.3 F (36.8 C) (Axillary)  Resp (!) 22   Ht 5\' 8"  (1.727 m)   Wt 248 lb 10.9 oz (112.8 kg)   SpO2 99%   BMI 37.81 kg/m   General Appearance: No distress , sedated Neuro:without focal findings, mental status reduced HEENT: PERRLA, EOM intact. Pulmonary: reduced bilateral breath sounds   CardiovascularNormal S1,S2.  No m/r/g.   Abdomen: Benign, Soft, non-tender. Severe scrotal edema.  Renal:  No costovertebral tenderness  GU:  Not performed at this  time. Endocrine: No evident thyromegaly. Skin:   warm, no rashes, no ecchymosis  Extremities: normal, no cyanosis, clubbing. 3+edema.     LABORATORY PANEL:   CBC  Recent Labs Lab 03/05/17 0430  WBC 18.4*  HGB 12.0*  HCT 36.1*  PLT 101*    Chemistries   Recent Labs Lab 03/05/17 0430  NA 143  K 3.4*  CL 122*  CO2 19*  GLUCOSE 185*  BUN 44*  CREATININE 1.32*  CALCIUM 7.3*  MG 2.5*  PHOS 2.2*  AST 45*  ALT 28  ALKPHOS 71  BILITOT 0.5     Recent Labs Lab 03/04/17 1204 03/04/17 1544 03/04/17 1954 03/04/17 2332 03/05/17 0336 03/05/17 0750  GLUCAP 118* 141* 191* 155* 139* 135*    Recent Labs Lab 03/01/17 0500 03/03/17 0330 03/04/17 0340  PHART 7.16* 7.27* 7.32*  PCO2ART 56* 37 32  PO2ART 69* 73* 84    Recent Labs Lab 03/02/17 0500 03/04/17 0440 03/05/17 0430  AST 62* 58* 45*  ALT 32 36 28  ALKPHOS 45 68 71  BILITOT 0.7 0.4 0.5  ALBUMIN 1.9* 1.7* 1.5*    Cardiac Enzymes  Recent Labs Lab 03/01/17 1356  TROPONINI 0.09*    RADIOLOGY:  Dg Chest 1 View  Result Date: 03/05/2017 CLINICAL DATA:  Central line placement EXAM: CHEST 1 VIEW COMPARISON:  03/04/2017 FINDINGS: Interval removal of the right central line and placement of left central line. The tip is in the SVC. No pneumothorax. Otherwise, support devices are unchanged. Bilateral perihilar and lower lobe airspace opacities are again noted, not significantly changed. Small bilateral layering effusions. Heart is borderline in size. IMPRESSION: Stable layering effusions and bilateral lower lobe opacities. Electronically Signed   By: Charlett NoseKevin  Dover M.D.   On: 03/05/2017 07:28   Dg Chest 1 View  Result Date: 03/04/2017 CLINICAL DATA:  Dyspnea. EXAM: CHEST 1 VIEW COMPARISON:  03/03/2017 FINDINGS: Endotracheal tube terminates 3.3 cm above the carina. Right jugular catheter terminates over the lower SVC. 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 The cardiac silhouette is mildly enlarged. There is  pulmonary vascular congestion with patchy perihilar and basilar opacity bilaterally, stable to slightly increased. Small bilateral pleural effusions are stable to slightly increased. No pneumothorax is identified. IMPRESSION: 1. Cardiomegaly and pulmonary vascular congestion with stable to slightly increased bibasilar opacities which may reflect edema and atelectasis. 2. Persistent small pleural effusions. Electronically Signed   By: Sebastian AcheAllen  Grady M.D.   On: 03/04/2017 07:10   Dg Abd 1 View  Result Date: 03/04/2017 CLINICAL DATA:  Abdominal distention EXAM: ABDOMEN - 1 VIEW COMPARISON:  03/01/2017 FINDINGS: NG tube tip is in the distal stomach. Nonobstructive bowel gas pattern. No free air or organomegaly. IMPRESSION: No acute findings.  NG tube tip in the distal stomach. Electronically Signed   By: Charlett NoseKevin  Dover M.D.   On: 03/04/2017 07:30        --Wells Guileseep Khyle Goodell, MD.  ICU Pager: [phone]  Pulmonary and Critical Care Office Number: [phone]   03/05/2017   Critical Care Attestation.  I have personally obtained a history, examined the patient, evaluated laboratory and imaging results, formulated the assessment and plan and placed orders. The Patient requires high complexity decision making for assessment and support, frequent evaluation and titration of therapies, application of advanced monitoring technologies and extensive interpretation of multiple databases. The patient has critical illness that could lead imminently to failure of 1 or more organ systems and requires the highest level of physician preparedness to intervene.  Critical Care Time devoted to patient care services described in this note is 36 minutes and is exclusive of time spent in procedures supervisory time of NP.

## 2017-03-05 NOTE — Progress Notes (Signed)
RN spoke with Dr. Nicholos Johnsamachandran and made MD aware that patient is extubated, not following commands but purposeful.  Non-labored breathing but snoring at times and o2 sats 99% on 2L nasal cannula. RN asked about normal saline @ 100 cc/H? MD gave order to d/c IVF and no further orders.

## 2017-03-05 NOTE — Progress Notes (Signed)
Giving patient bath and turning him, his central line IJ on the right dislodged. I informed NP and she place another central line in the IJ on the left side, patient was stable during procedure. Stat x-ray was done.

## 2017-03-05 NOTE — Progress Notes (Signed)
Pt. Extubated and placed on 2 liters of oxygen,hr 94,rr19,sat 98.

## 2017-03-05 NOTE — Progress Notes (Signed)
RN called and spoke with Dr. Nicholos Johnsamachandran and made MD aware that patient sounds audibly congested and has lasix 20 mg ordered BID.  MD gave order for nasotracheal suctioning.  RN called Peyton NajjarLarry, RRT and asked him to come NT suction patient.

## 2017-03-05 NOTE — Progress Notes (Signed)
Nutrition Follow-up  DOCUMENTATION CODES:   Obesity unspecified  INTERVENTION:  At 1800 tonight recommend re-initiating Clinimix E 5/15 at 40 ml/hr. After 24 hrs if electrolytes and CBGs are within normal limits, can advance to goal regimen of Clinimix E 5/15 at 83 ml/hr. Goal regimen provides 1414 kcal (100% estimated kcal needs), 100 grams of protein (71% estimated protein needs), 1992 ml fluid daily.  Continue to hold ILE first week as patient is critically ill.  Once weaning trial complete, if unable to extubate, recommend resuming Vital High Protein at 10 ml/hr via OGT.  NUTRITION DIAGNOSIS:   Inadequate oral intake related to inability to eat as evidenced by NPO status.  Ongoing.  GOAL:   Provide needs based on ASPEN/SCCM guidelines  Not met - TPN and tube feeds currently on hold. Will progress with re-initiation of TPN tonight.  MONITOR:   Vent status, Labs, Weight trends, TF tolerance, I & O's  REASON FOR ASSESSMENT:   Ventilator    ASSESSMENT:   76 year old male with PMHx of HTN, bipolar disorder who presented with N/V for 3 days found to have ischemic small bowel with small bowel pneumatosis in left lower abdomen. Sent to ICU with SOB secondary to PNA requiring intubation. Pt s/p exploratory laparotomy without SBR on 7/25.  -TF had been running at Vital High Protein at 25 ml/hr. Was held 7/28 due to abdominal distention. Per abdominal x-ray 7/28 no acute findings. Did not find free air or organomegaly.   Discussed with RN. TPN had to be discarded this AM when central line right IJ was dislodged. Pharmacy unable to send another bag until 1800 tonight. Plan is to continue to monitor CBGs to see if patient will need D10 IV fluids until re-initiation of TPN tonight. TFs currently on hold due to clog in line. Patient also on weaning trial at this time.   Discussed with Pharmacist. Plan is to resume Clinimix E 5/15 at 40 ml/hr tonight. Continue withholding ILE for total  of one week.  IV Access: CVC triple lumen left internal jugular placed 03/05/2017; per chest x-ray today tip is in the SVC  TPN: Clinimix E 5/15 was initiated last night at 40 ml/hr (682 kcal, 48 grams protein, 960 ml fluid).  Enteral Access: OGT placed 7/25; tip verified to be in distal stomach per abdominal x-ray on 7/28  TF: Vital High Protein was resumed at 10 ml/hr yesterday; currently on hold  Patient is currently intubated on ventilator support MV: 14 L/min Temp (24hrs), Avg:98.6 F (37 C), Min:97.8 F (36.6 C), Max:99.3 F (37.4 C)  Propofol: N/A  Medications reviewed and include: allopurinol, vitamin D 1000 units daily, Lasix 20 mg Q12hrs IV, Novolog 0-9 units Q4hrs (received 7 units past 24 hrs), thiamine 100 mg daily IV, vitamin B12 1000 micrograms daily per tube, NS @ 100 ml/hr, fentanyl gtt, phenylephrine gtt.  Labs reviewed: CBG 118-191 past 24 hrs, Potassium 3.4 (decreased from 4.1 on 7/28), Chloride 122 (trending up), CO2 19 (stable from yesterday), BUN 44 (stable from yesterday), Creatinine 1.32 (trending down), Phosphorus 2.2 (decreased from 2.6 7/28), Magnesium 2.5 (increased from 2.4 7/28), Triglycerides 188.  Assessed edema in setting of weight trend. Patient has +1 mild pitting edema in bilateral upper and lower extremities. Per RN edema assessment patient has +3 deep pitting edema in perineal area.  I/O: 1.425 L UOP past 24 hrs (0.5 ml/kg/hr - inadequate); no stool output  Weight trend: 112.8 kg on 7/29 (+14.4 kg from initial assessment)  Diet Order:  .TPN (CLINIMIX-E) Adult .TPN (CLINIMIX-E) Adult  Skin:  Wound (see comment) (closed incision abdomen)  Last BM:  02/28/2017  Height:   Ht Readings from Last 1 Encounters:  03/01/17 _0  (1.727 m)    Weight:   Wt Readings from Last 1 Encounters:  03/05/17 248 lb 10.9 oz (112.8 kg)    Ideal Body Weight:  70 kg  BMI:  Body mass index is 37.81 kg/m.  Estimated Nutritional Needs:   Kcal:  8937-3749  (11-14 kcal/kg)  Protein:  >/= 140 grams (>/= 2 g/kg IBW)  Fluid:  1.8 L/day (25 ml/kg IBW)  EDUCATION NEEDS:   No education needs identified at this time  Willey Blade, MS, RD, LDN Pager: [phone] After Hours Pager: [phone]

## 2017-03-05 NOTE — Progress Notes (Signed)
Pt was stable through out the night,  Neo at 4650mcg/ Fentanyl at 11450mcg/ TPN at 2040ml/hr Pt scrotum is more swollen, then start of shift, NP was notified and will assess.

## 2017-03-05 NOTE — Procedures (Signed)
Central Venous Catheter Insertion Procedure Note Alvin PeckJohn [name] [id number] 02/25/1941  Procedure: Insertion of Central Venous Catheter Indications: Assessment of intravascular volume, Drug and/or fluid administration, Frequent blood sampling and right IJ central venous catheter dislodged  Procedure Details Consent: Unable to obtain consent because of emergent medical necessity. Time Out: Verified patient identification, verified procedure, site/side was marked, verified correct patient position, special equipment/implants available, medications/allergies/relevent history reviewed, required imaging and test results available.  Performed  Maximum sterile technique was used including antiseptics, cap, gloves, gown, hand hygiene, mask and sheet. Skin prep: Chlorhexidine; local anesthetic administered A antimicrobial bonded/coated triple lumen catheter was placed in the right internal jugular vein using the Seldinger technique.  Evaluation Blood flow good Complications: No apparent complications Patient did tolerate procedure well. Chest X-ray ordered to verify placement.  CXR: normal.  Procedure performed under direct supervision of Dr.Sadae Arrazola. Ultrasound utilized for realtime vessel cannulation  Alvin [name] ANP-BC Pulmonary and Critical Care Medicine [facility] Pager [phone] or [phone]  03/05/2017, 8:33 AM

## 2017-03-05 NOTE — Progress Notes (Signed)
Pharmacy Antibiotic Note  Alvin [name] is a 76 y.o. male admitted on 02/28/2017 with severe sepsis shock secondary to aspiration pneumonia and small bowel ischemia.  Pharmacy was consulted for vancomycin and Zosyn dosing. Vancomycin was discontinued on 7/25.  Plan: Continue Zosyn 3.375 IV EI every 8 hours.   Height: 5\' 8"  (172.7 cm) Weight: 248 lb 10.9 oz (112.8 kg) IBW/kg (Calculated) : 68.4  Temp (24hrs), Avg:98.6 F (37 C), Min:97.8 F (36.6 C), Max:99.3 F (37.4 C)   Recent Labs Lab 02/28/17 2158 03/01/17 0330 03/01/17 0801 03/01/17 1015 03/01/17 2329 03/02/17 0500 03/03/17 0430 03/04/17 0440 03/04/17 0514 03/05/17 0430  WBC 23.7*  --  16.4*  --   --  24.7* 29.0* 26.7*  --  18.4*  CREATININE 4.75* 3.90* 3.40*  --  3.28* 3.03* 2.00* 1.45*  --  1.32*  LATICACIDVEN 4.6*  7.3* 3.4*  --  3.4* 4.7*  --   --   --  1.3  --     Estimated Creatinine Clearance: 59 mL/min (A) (by C-G formula based on SCr of 1.32 mg/dL (H)).    No Known Allergies  Antimicrobials this admission: Vancomycin cefepime 7/24  >> 7/25 7/25 Zosyn >>  Dose adjustments this admission:   Microbiology results: 7/24 BCx: staph species  7/24 UCx: NG final  7/25 Sputum: pending  7/25 MRSA PCR: negative    7/24 UA: (-) 7/24 CXR: L base opacity Thank you for allowing pharmacy to be a part of this patient's care.  Demetrius CharityJames,[name], PharmD, BCPS Clinical Pharmacist 03/05/2017 10:13 AM

## 2017-03-05 NOTE — Progress Notes (Signed)
[facility]    Alvin PeckJohn Dirks was admitted to the Hospital on 02/28/2017 and Discharged  03/05/2017 and pt's family member should be excused from work/school   for 5 days starting 02/28/2017 , may return to work/school without any restrictions.  Call Alvin LiasVivek [name] MD, [facility]  [phone] with questions.  Alvin SirenSAINANI,Alvin [name] J M.D on 03/05/2017,at 1:47 PM

## 2017-03-05 NOTE — Progress Notes (Signed)
[facility] - [place] at [facility]   PATIENT NAME: Alvin PeckJohn [name]    MR#:  [id number]  DATE OF BIRTH:  12/11/1940  SUBJECTIVE:   Patient here due to suspected ischemic colitis and status post exploratory laparotomy which showed no evidence of bowel ischemia. Placed on SBT this a.m. In an attempt to wean off vent today.  Off vasopressors now. Patient's family is at bedside.  REVIEW OF SYSTEMS:    Review of Systems  Unable to perform ROS: Intubated    Nutrition: Tube Feeds Tolerating Diet: Yes Tolerating PT: Await Eval.  DRUG ALLERGIES:  No Known Allergies  VITALS:  Blood pressure (!) 119/58, pulse 75, temperature 98 F (36.7 C), temperature source Axillary, resp. rate 11, height 5\' 8"  (1.727 m), weight 112.8 kg (248 lb 10.9 oz), SpO2 98 %.  PHYSICAL EXAMINATION:   Physical Exam  GENERAL:  76 y.o.-year-old patient lying in bed sedated & Intubated.  EYES: Right Pupil if fixed and dilated and not responsive to light. No scleral icterus.  HEENT: Head atraumatic, normocephalic. ET and OG tubes in place.  NECK:  Supple, no jugular venous distention. No thyroid enlargement, no tenderness.  LUNGS: Normal breath sounds bilaterally, no wheezing, rales, rhonchi. No use of accessory muscles of respiration.  CARDIOVASCULAR: S1, S2 normal. No murmurs, rubs, or gallops.  ABDOMEN: Soft, nontender, nondistended. Bowel sounds Hypoactive but present. No organomegaly or mass.  EXTREMITIES: No cyanosis, clubbing or edema b/l.    NEUROLOGIC: Sedated & intubated. PSYCHIATRIC: Sedated & intubated.   SKIN: No obvious rash, lesion, or ulcer.    LABORATORY PANEL:   CBC  Recent Labs Lab 03/05/17 0430  WBC 18.4*  HGB 12.0*  HCT 36.1*  PLT 101*   ------------------------------------------------------------------------------------------------------------------  Chemistries   Recent Labs Lab 03/05/17 0430  NA 143  K 3.4*  CL 122*  CO2 19*  GLUCOSE 185*  BUN 44*   CREATININE 1.32*  CALCIUM 7.3*  MG 2.5*  AST 45*  ALT 28  ALKPHOS 71  BILITOT 0.5   ------------------------------------------------------------------------------------------------------------------  Cardiac Enzymes  Recent Labs Lab 03/01/17 1356  TROPONINI 0.09*   ------------------------------------------------------------------------------------------------------------------  RADIOLOGY:  Dg Chest 1 View  Result Date: 03/05/2017 CLINICAL DATA:  Central line placement EXAM: CHEST 1 VIEW COMPARISON:  03/04/2017 FINDINGS: Interval removal of the right central line and placement of left central line. The tip is in the SVC. No pneumothorax. Otherwise, support devices are unchanged. Bilateral perihilar and lower lobe airspace opacities are again noted, not significantly changed. Small bilateral layering effusions. Heart is borderline in size. IMPRESSION: Stable layering effusions and bilateral lower lobe opacities. Electronically Signed   By: Charlett NoseKevin  Dover M.D.   On: 03/05/2017 07:28   Dg Chest 1 View  Result Date: 03/04/2017 CLINICAL DATA:  Dyspnea. EXAM: CHEST 1 VIEW COMPARISON:  03/03/2017 FINDINGS: Endotracheal tube terminates 3.3 cm above the carina. Right jugular catheter terminates over the lower SVC. 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 The cardiac silhouette is mildly enlarged. There is pulmonary vascular congestion with patchy perihilar and basilar opacity bilaterally, stable to slightly increased. Small bilateral pleural effusions are stable to slightly increased. No pneumothorax is identified. IMPRESSION: 1. Cardiomegaly and pulmonary vascular congestion with stable to slightly increased bibasilar opacities which may reflect edema and atelectasis. 2. Persistent small pleural effusions. Electronically Signed   By: Sebastian AcheAllen  Grady M.D.   On: 03/04/2017 07:10   Dg Abd 1 View  Result Date: 03/04/2017 CLINICAL DATA:  Abdominal distention EXAM: ABDOMEN - 1 VIEW  COMPARISON:   03/01/2017 FINDINGS: NG tube tip is in the distal stomach. Nonobstructive bowel gas pattern. No free air or organomegaly. IMPRESSION: No acute findings.  NG tube tip in the distal stomach. Electronically Signed   By: Charlett NoseKevin  Dover M.D.   On: 03/04/2017 07:30     ASSESSMENT AND PLAN:   76 year old male with past medical history of hypertension, bipolar disorder, who presented to the hospital due to abdominal pain, nausea or diarrhea and suspected to have ischemic bowel and status post exploratory laparotomy which was negative for ischemic bowel patient now is severe sepsis with septic shock secondary to pneumonia.  1. Severe sepsis with septic shock-suspected to be secondary to pneumonia. - improving and off vasopressor now.  Cont. Zosyn.  Follow fever curve and hemodynamics.   2. Acute respiratory failure-secondary to pneumonia/ARDS. - placed on SBT this a.m. In an attempt to wean off vent and extubate today.  - cont. Further care as per Pulmonary  3. Abdominal pain/distention-patient is status post exploratory laparotomy showing no evidence of ischemic bowel. -Currently on trickle tube feeds and tolerating it well. Cont. TPN as per surgery. -Continue further care as per general surgery.  4. Elevated troponin-secondary to supply demand ischemia, No evidence of acute coronary syndrome.  5. Leukocytosis-secondary to sepsis follow white cell count with antibiotic therapy treatment.  6. Hx of Bipolar Disorder - cont. Depakote.   7. Hyperlipidemia - cont. Atorvastatin  8. Hx of Gout - cont. Allopurinol.   Discussed plan of care with pt's daughter at bedside.   All the records are reviewed and case discussed with Care Management/Social Worker. Management plans discussed with the patient, family and they are in agreement.  CODE STATUS: DNR  DVT Prophylaxis: Hep SQ  TOTAL TIME TAKING CARE OF THIS PATIENT: 30 minutes.   POSSIBLE D/C unclear, DEPENDING ON CLINICAL  CONDITION.   [name],VIVEK J M.D on 03/05/2017 at 1:31 PM  Between 7am to 6pm - Pager - [phone]  After 6pm go to [email or web address] - Social research officer, governmentpassword EPAS [facility]  [facility] [place] [facility]  Office  [phone]  CC: Primary care physician; System, Pcp Not In

## 2017-03-05 NOTE — Progress Notes (Signed)
Extubated to 2 L nasal cannula.  Alert and purposeful but snoring at times.  Does not follow commands.  o2 sats 99%.  Non-labored breathing.  Will continue to monitor.

## 2017-03-05 NOTE — Progress Notes (Signed)
4 Days Post-Op  Subjective: Patient remains on ventilator. He was on 3 pressors earlier today but now is on 1. He remains acidotic with a bicarbonate of 19 but no clear etiology. Patient is seen with the intensive care physician and staff.  Objective: Vital signs in last 24 hours: Temp:  [97.8 F (36.6 C)-99.3 F (37.4 C)] 98.3 F (36.8 C) (07/29 0800) Pulse Rate:  [55-90] 83 (07/29 0830) Resp:  [8-26] 21 (07/29 0830) BP: (78-131)/(45-90) 109/90 (07/29 0830) SpO2:  [95 %-100 %] 100 % (07/29 0830) Arterial Line BP: (88-194)/(46-106) 161/76 (07/29 0830) FiO2 (%):  [30 %] 30 % (07/29 0800) Weight:  [248 lb 10.9 oz (112.8 kg)] 248 lb 10.9 oz (112.8 kg) (07/29 0500) Last BM Date: 03/03/17  Intake/Output from previous day: 07/28 0701 - 07/29 0700 In: 3800.5 [I.V.:3450.5; NG/GT:300; IV Piggyback:50] Out: 1425 [Urine:1425] Intake/Output this shift: Total I/O In: 65 [I.V.:65] Out: -   Physical exam:  Awakens to voice. On the ventilator. Vital signs are reviewed. On pressors.  Abdominal wound is clean abdomen is distended slightly tympanitic nontender  Scrotum is edematous but non-erythematous  Lab Results: CBC   Recent Labs  03/04/17 0440 03/05/17 0430  WBC 26.7* 18.4*  HGB 12.3* 12.0*  HCT 37.0* 36.1*  PLT 107* 101*   BMET  Recent Labs  03/04/17 0440 03/05/17 0430  NA 142 143  K 4.1 3.4*  CL 119* 122*  CO2 19* 19*  GLUCOSE 170* 185*  BUN 44* 44*  CREATININE 1.45* 1.32*  CALCIUM 7.3* 7.3*   PT/INR No results for input(s): LABPROT, INR in the last 72 hours. ABG  Recent Labs  03/03/17 0330 03/04/17 0340  PHART 7.27* 7.32*  HCO3 17.0* 16.5*    Studies/Results: Dg Chest 1 View  Result Date: 03/05/2017 CLINICAL DATA:  Central line placement EXAM: CHEST 1 VIEW COMPARISON:  03/04/2017 FINDINGS: Interval removal of the right central line and placement of left central line. The tip is in the SVC. No pneumothorax. Otherwise, support devices are unchanged.  Bilateral perihilar and lower lobe airspace opacities are again noted, not significantly changed. Small bilateral layering effusions. Heart is borderline in size. IMPRESSION: Stable layering effusions and bilateral lower lobe opacities. Electronically Signed   By: Charlett NoseKevin  Dover M.D.   On: 03/05/2017 07:28   Dg Chest 1 View  Result Date: 03/04/2017 CLINICAL DATA:  Dyspnea. EXAM: CHEST 1 VIEW COMPARISON:  03/03/2017 FINDINGS: Endotracheal tube terminates 3.3 cm above the carina. Right jugular catheter terminates over the lower SVC. 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 The cardiac silhouette is mildly enlarged. There is pulmonary vascular congestion with patchy perihilar and basilar opacity bilaterally, stable to slightly increased. Small bilateral pleural effusions are stable to slightly increased. No pneumothorax is identified. IMPRESSION: 1. Cardiomegaly and pulmonary vascular congestion with stable to slightly increased bibasilar opacities which may reflect edema and atelectasis. 2. Persistent small pleural effusions. Electronically Signed   By: Sebastian AcheAllen  Grady M.D.   On: 03/04/2017 07:10   Dg Abd 1 View  Result Date: 03/04/2017 CLINICAL DATA:  Abdominal distention EXAM: ABDOMEN - 1 VIEW COMPARISON:  03/01/2017 FINDINGS: NG tube tip is in the distal stomach. Nonobstructive bowel gas pattern. No free air or organomegaly. IMPRESSION: No acute findings.  NG tube tip in the distal stomach. Electronically Signed   By: Charlett NoseKevin  Dover M.D.   On: 03/04/2017 07:30    Anti-infectives: Anti-infectives    Start     Dose/Rate Route Frequency Ordered Stop   03/02/17 1400  piperacillin-tazobactam (ZOSYN) IVPB 3.375 g     3.375 g 12.5 mL/hr over 240 Minutes Intravenous Every 8 hours 03/02/17 0855     03/01/17 1800  ceFEPIme (MAXIPIME) 2 g in dextrose 5 % 50 mL IVPB  Status:  Discontinued     2 g 100 mL/hr over 30 Minutes Intravenous Every 24 hours 03/01/17 0250 03/01/17 0423   03/01/17 0600  vancomycin (VANCOCIN)  IVPB 1000 mg/200 mL premix  Status:  Discontinued     1,000 mg 200 mL/hr over 60 Minutes Intravenous Every 48 hours 03/01/17 0250 03/01/17 1125   03/01/17 0600  piperacillin-tazobactam (ZOSYN) IVPB 3.375 g  Status:  Discontinued     3.375 g 12.5 mL/hr over 240 Minutes Intravenous Every 6 hours 03/01/17 0423 03/01/17 0434   03/01/17 0445  piperacillin-tazobactam (ZOSYN) IVPB 3.375 g  Status:  Discontinued     3.375 g 12.5 mL/hr over 240 Minutes Intravenous Every 12 hours 03/01/17 0434 03/02/17 0855   02/28/17 2215  ceFEPIme (MAXIPIME) 2 g in dextrose 5 % 50 mL IVPB     2 g 100 mL/hr over 30 Minutes Intravenous  Once 02/28/17 2203 02/28/17 2243   02/28/17 2215  vancomycin (VANCOCIN) IVPB 1000 mg/200 mL premix     1,000 mg 200 mL/hr over 60 Minutes Intravenous  Once 02/28/17 2203 02/28/17 2316      Assessment/Plan: s/p Procedure(s): EXPLORATORY LAPAROTOMY   Status post negative laparotomy for acidosis. He remains acidotic but yesterday had a normal lactate level no lactate is available this morning. He has no signs of abdominal tenderness. He remains on low-dose pressor single drug therapy. He is on the ventilator. Unclear etiology for his condition. This was discussed in detail with the ICU staff. He has been started on TPN and I spoke to nutrition yesterday concerning the need for this and continuing elemental tube feeds as well.  Lattie Hawichard E Evva Din, MD, FACS  03/05/2017

## 2017-03-06 ENCOUNTER — Inpatient Hospital Stay: Payer: Medicare Other

## 2017-03-06 LAB — COMPREHENSIVE METABOLIC PANEL
ALK PHOS: 72 U/L (ref 38–126)
ALT: 30 U/L (ref 17–63)
ANION GAP: 4 — AB (ref 5–15)
AST: 47 U/L — ABNORMAL HIGH (ref 15–41)
Albumin: 1.6 g/dL — ABNORMAL LOW (ref 3.5–5.0)
BUN: 42 mg/dL — ABNORMAL HIGH (ref 6–20)
CALCIUM: 7.7 mg/dL — AB (ref 8.9–10.3)
CO2: 23 mmol/L (ref 22–32)
Chloride: 122 mmol/L — ABNORMAL HIGH (ref 101–111)
Creatinine, Ser: 1.39 mg/dL — ABNORMAL HIGH (ref 0.61–1.24)
GFR, EST AFRICAN AMERICAN: 56 mL/min — AB (ref >60)
GFR, EST NON AFRICAN AMERICAN: 48 mL/min — AB (ref >60)
GLUCOSE: 224 mg/dL — AB (ref 65–99)
POTASSIUM: 3.7 mmol/L (ref 3.5–5.1)
Sodium: 149 mmol/L — ABNORMAL HIGH (ref 135–145)
TOTAL PROTEIN: 4.4 g/dL — AB (ref 6.5–8.1)
Total Bilirubin: 0.6 mg/dL (ref 0.3–1.2)

## 2017-03-06 LAB — GLUCOSE, CAPILLARY
GLUCOSE-CAPILLARY: 177 mg/dL — AB (ref 65–99)
GLUCOSE-CAPILLARY: 164 mg/dL — AB (ref 65–99)
Glucose-Capillary: 164 mg/dL — ABNORMAL HIGH (ref 65–99)
Glucose-Capillary: 159 mg/dL — ABNORMAL HIGH (ref 65–99)
Glucose-Capillary: 176 mg/dL — ABNORMAL HIGH (ref 65–99)

## 2017-03-06 LAB — CBC WITH DIFFERENTIAL/PLATELET
BASOS ABS: 0.1 10*3/uL (ref 0–0.1)
BASOS PCT: 1 %
Eosinophils Absolute: 0 10*3/uL (ref 0–0.7)
Eosinophils Relative: 0 %
HCT: 36.9 % — ABNORMAL LOW (ref 40.0–52.0)
HEMOGLOBIN: 12.4 g/dL — AB (ref 13.0–18.0)
Lymphocytes Relative: 3 %
Lymphs Abs: 0.7 10*3/uL — ABNORMAL LOW (ref 1.0–3.6)
MCH: 33.2 pg (ref 26.0–34.0)
MCHC: 33.6 g/dL (ref 32.0–36.0)
MCV: 98.7 fL (ref 80.0–100.0)
MONO ABS: 1.1 10*3/uL — AB (ref 0.2–1.0)
Monocytes Relative: 5 %
NEUTROS ABS: 19.6 10*3/uL — AB (ref 1.4–6.5)
Neutrophils Relative %: 91 %
Platelets: 101 10*3/uL — ABNORMAL LOW (ref 150–440)
RBC: 3.73 MIL/uL — AB (ref 4.40–5.90)
RDW: 14.6 % — AB (ref 11.5–14.5)
WBC: 21.6 10*3/uL — AB (ref 3.8–10.6)

## 2017-03-06 LAB — PREALBUMIN: Prealbumin: 5.1 mg/dL — ABNORMAL LOW (ref 18–38)

## 2017-03-06 LAB — PHOSPHORUS: PHOSPHORUS: 3.1 mg/dL (ref 2.5–4.6)

## 2017-03-06 LAB — CULTURE, RESPIRATORY W GRAM STAIN: Culture: NORMAL

## 2017-03-06 LAB — MAGNESIUM: MAGNESIUM: 2.2 mg/dL (ref 1.7–2.4)

## 2017-03-06 LAB — CULTURE, RESPIRATORY

## 2017-03-06 LAB — TRIGLYCERIDES: TRIGLYCERIDES: 163 mg/dL — AB (ref <150)

## 2017-03-06 MED ORDER — VITAL HIGH PROTEIN PO LIQD
1000.0000 mL | ORAL | Status: DC
  Administered 2017-03-06: 23:00:00 1000 mL

## 2017-03-06 MED ORDER — TRACE MINERALS CR-CU-MN-SE-ZN 10-1000-500-60 MCG/ML IV SOLN
INTRAVENOUS | Status: DC
  Administered 2017-03-06: 19:00:00 via INTRAVENOUS
  Filled 2017-03-06: qty 1992

## 2017-03-06 MED ORDER — ALBUTEROL SULFATE (2.5 MG/3ML) 0.083% IN NEBU
2.5000 mg | INHALATION_SOLUTION | Freq: Four times a day (QID) | RESPIRATORY_TRACT | Status: DC
  Administered 2017-03-06 – 2017-03-10 (×14): 2.5 mg via RESPIRATORY_TRACT
  Filled 2017-03-06 (×17): qty 3

## 2017-03-06 MED ORDER — FENTANYL CITRATE (PF) 100 MCG/2ML IJ SOLN: 25.0000 ug | INTRAMUSCULAR | Status: DC | PRN

## 2017-03-06 NOTE — Care Management (Signed)
TPN currently however up to Surgeon to determine length of need. Referral to [facility] for review however patient may need higher level of care at discharge.

## 2017-03-06 NOTE — Progress Notes (Signed)
RN spoke with Dr. Aleen CampiPiscoya and asked MD to verify Dobbhoff tube placement by looking at xray.  MD gave verbal order that tube is in correct position and okay to use.

## 2017-03-06 NOTE — Progress Notes (Signed)
During rounds Dr. Sung AmabileSimonds gave order to discontinue order for PICC line.

## 2017-03-06 NOTE — Progress Notes (Signed)
[facility] - [place] at [facility]   PATIENT NAME: Alvin [name]    MR#:  [id number]  DATE OF BIRTH:  05/28/1941  SUBJECTIVE:   Patient extubated yesterday and remains on nasal cannula oxygen doing well. Remains agitated and altered and on a Precedex drip.  REVIEW OF SYSTEMS:    Review of Systems  Unable to perform ROS: Mental acuity    Nutrition: TPN Tolerating Diet: Yes Tolerating PT: Await Eval.  DRUG ALLERGIES:  No Known Allergies  VITALS:  Blood pressure (!) 143/55, pulse 90, temperature 98 F (36.7 C), temperature source Axillary, resp. rate (!) 22, height 5\' 8"  (1.727 m), weight 108.9 kg (240 lb 1.3 oz), SpO2 100 %.  PHYSICAL EXAMINATION:   Physical Exam  GENERAL:  76 y.o.-year-old patient lying in bed sedated & Intubated.  EYES: Right Pupil if fixed and dilated and not responsive to light. No scleral icterus.  HEENT: Head atraumatic, normocephalic. ET and OG tubes in place.  NECK:  Supple, no jugular venous distention. No thyroid enlargement, no tenderness.  LUNGS: Normal breath sounds bilaterally, no wheezing, rales, rhonchi. No use of accessory muscles of respiration.  CARDIOVASCULAR: S1, S2 normal. No murmurs, rubs, or gallops.  ABDOMEN: Soft, nontender, nondistended. Bowel sounds Hypoactive but present. No organomegaly or mass.  EXTREMITIES: No cyanosis, clubbing or edema b/l.    NEUROLOGIC: Globally weak and No focal motor or sensory deficits b/l PSYCHIATRIC: Encephalopathic and does not follow any commands. Agitated at times. SKIN: No obvious rash, lesion, or ulcer.    LABORATORY PANEL:   CBC  Recent Labs Lab 03/06/17 0421  WBC 21.6*  HGB 12.4*  HCT 36.9*  PLT 101*   ------------------------------------------------------------------------------------------------------------------  Chemistries   Recent Labs Lab 03/06/17 0421  NA 149*  K 3.7  CL 122*  CO2 23  GLUCOSE 224*  BUN 42*  CREATININE 1.39*  CALCIUM 7.7*  MG 2.2   AST 47*  ALT 30  ALKPHOS 72  BILITOT 0.6   ------------------------------------------------------------------------------------------------------------------  Cardiac Enzymes  Recent Labs Lab 03/01/17 1356  TROPONINI 0.09*   ------------------------------------------------------------------------------------------------------------------  RADIOLOGY:  Dg Chest 1 View  Result Date: 03/05/2017 CLINICAL DATA:  Central line placement EXAM: CHEST 1 VIEW COMPARISON:  03/04/2017 FINDINGS: Interval removal of the right central line and placement of left central line. The tip is in the SVC. No pneumothorax. Otherwise, support devices are unchanged. Bilateral perihilar and lower lobe airspace opacities are again noted, not significantly changed. Small bilateral layering effusions. Heart is borderline in size. IMPRESSION: Stable layering effusions and bilateral lower lobe opacities. Electronically Signed   By: Charlett NoseKevin  Dover M.D.   On: 03/05/2017 07:28   Dg Chest Port 1 View  Result Date: 03/06/2017 CLINICAL DATA:  Respiratory failure . EXAM: PORTABLE CHEST 1 VIEW COMPARISON:  03/05/2017. FINDINGS: Interim extubation. Cardiac pacer noted with lead tips in right atrium right ventricle. Cardiomegaly with normal pulmonary vascularity. Low lung volumes with bibasilar atelectasis/infiltrate. Tiny bilateral pleural effusions cannot be excluded. No pneumothorax. IMPRESSION: 1.  Interim extubation and removal of NG tube. 2. Persistent bibasilar atelectasis/infiltrates. Tiny bilateral pleural effusions cannot be excluded . Chest is unchanged from prior exam . 3.  Cardiac pacer in stable position.  Stable cardiomegaly. Electronically Signed   By: Maisie Fushomas  Register   On: 03/06/2017 07:10     ASSESSMENT AND PLAN:   76 year old male with past medical history of hypertension, bipolar disorder, who presented to the hospital due to abdominal pain, nausea or diarrhea and suspected to have  ischemic bowel and status  post exploratory laparotomy which was negative for ischemic bowel patient now is severe sepsis with septic shock secondary to pneumonia.  1. Severe sepsis with septic shock-suspected to be secondary to pneumonia. - improving and off vasopressor now.  Cont. Zosyn.  Follow fever curve and hemodynamics.   2. Acute respiratory failure-secondary to pneumonia/ARDS. -Extubated yesterday and on nasal cannula oxygen doing well. Continue aggressive pulmonary toileting, continue further care as per pulmonary.  3. Abdominal pain/distention-patient is status post exploratory laparotomy showing no evidence of ischemic bowel. -Currently on TPN but likely can be started on enteral feeds as he had no evidence of ischemic bowel during his laparotomy. I would defer further care to Gen. surgery. Patient will likely need a NG/Dobbhoff tube to start tube feedings.  4. Elevated troponin-secondary to supply demand ischemia, No evidence of acute coronary syndrome.  5. Leukocytosis-secondary to sepsis - follow white cell count with antibiotic therapy treatment.  6. Hx of Bipolar Disorder - cont. Depakote.   7. Hyperlipidemia - cont. Atorvastatin  8. Hx of Gout - cont. Allopurinol.   9. Agitation/AMS - cont. Precedex gtt.  ?? Consider Psych eval if not improving.   All the records are reviewed and case discussed with Care Management/Social Worker. Management plans discussed with the patient, family and they are in agreement.  CODE STATUS: DNR  DVT Prophylaxis: Hep SQ  TOTAL TIME TAKING CARE OF THIS PATIENT: 30 minutes.   POSSIBLE D/C unclear, DEPENDING ON CLINICAL CONDITION.   [name],Lakeria Starkman J M.D on 03/06/2017 at 3:38 PM  Between 7am to 6pm - Pager - [phone]  After 6pm go to [email or web address] - Social research officer, governmentpassword EPAS [facility]  [facility] [place] [facility]  Office  [phone]  CC: Primary care physician; System, Pcp Not In

## 2017-03-06 NOTE — Progress Notes (Signed)
Patient moved at this time to room 117 by bed with Chasity, NT.  Patient alert with no distress noted.  Patient continues to not follow commands but makes eye contact.  Report called prior to [facility], RN.

## 2017-03-06 NOTE — Progress Notes (Signed)
Pharmacy Antibiotic Note  Alvin [name] is a 76 y.o. male admitted on 02/28/2017 with severe sepsis shock secondary to aspiration pneumonia and small bowel ischemia.  Pharmacy was consulted for vancomycin and Zosyn dosing. Vancomycin was discontinued on 7/25. Blood cultures are growing CNS (1 bottle per set).   Plan: Continue Zosyn 3.375 IV EI every 8 hours. Will discuss potential need for echo/ID consult with MD.   Height: 5\' 8"  (172.7 cm) Weight: 240 lb 1.3 oz (108.9 kg) IBW/kg (Calculated) : 68.4  Temp (24hrs), Avg:98.2 F (36.8 C), Min:97.9 F (36.6 C), Max:98.7 F (37.1 C)   Recent Labs Lab 02/28/17 2158 03/01/17 0330  03/01/17 1015 03/01/17 2329 03/02/17 0500 03/03/17 0430 03/04/17 0440 03/04/17 0514 03/05/17 0430 03/06/17 0421  WBC 23.7*  --   < >  --   --  24.7* 29.0* 26.7*  --  18.4* 21.6*  CREATININE 4.75* 3.90*  < >  --  3.28* 3.03* 2.00* 1.45*  --  1.32* 1.39*  LATICACIDVEN 4.6*  7.3* 3.4*  --  3.4* 4.7*  --   --   --  1.3  --   --   < > = values in this interval not displayed.  Estimated Creatinine Clearance: 54.9 mL/min (A) (by C-G formula based on SCr of 1.39 mg/dL (H)).    No Known Allergies  Antimicrobials this admission: Vancomycin cefepime 7/24  >> 7/25 7/25 Zosyn >>  Dose adjustments this admission:   Microbiology results: 7/24 BCx: CNS  7/24 UCx: NG final  7/25 Sputum: normal flora 7/25 MRSA PCR: negative    7/24 UA: (-) 7/24 CXR: L base opacity Thank you for allowing pharmacy to be a part of this patient's care.  Valentina Guhristy, Ranetta Armacost D, PharmD, BCPS Clinical Pharmacist 03/06/2017 2:37 PM

## 2017-03-06 NOTE — Progress Notes (Signed)
PHARMACY - ADULT TOTAL PARENTERAL NUTRITION CONSULT NOTE   Pharmacy Consult for TPN Indication: ischemic bowel  Patient Measurements: Height: 5\' 8"  (172.7 cm) Weight: 240 lb 1.3 oz (108.9 kg) IBW/kg (Calculated) : 68.4 TPN AdjBW (KG): 75.9 Body mass index is 36.5 kg/m.  Assessment: 76 y/o M on TPN s/p exlap.   GI:  Endo:  Insulin requirements in the past 24 hours: 11 Lytes: WNL Renal: Pulm: Cards:  Hepatobil: Neuro: ID:  Best Practices: Patient has a central line - confirmed with RN TPN Access: TPN start date: 7/28  Plan:  5/15 Clinimix with E advanced to 6583ml/hr Hold 20% lipid emulsion for first 7 days for ICU patients per ASPEN guidelines  MVI and trace elements in TPN 0-9 units SSI q4h and adjust as needed  No electrolyte supplementation needed at this time. Labs have been ordered. Will recheck electrolytes with AM labs tomorrow.  Valentina Guhristy, Kynzi Levay D, PharmD Clinical Pharmacist 03/06/2017,2:29 PM

## 2017-03-06 NOTE — Progress Notes (Signed)
03/06/2017  Subjective: Patient is 5 Days Post-Op s/p negative exploratory laparotomy.  No acute events overnight.  Has been extubated and off pressors.  Currently on TPN and had been previously tolerating tube feeds.  Vital signs: Temp:  [97.9 F (36.6 C)-98.7 F (37.1 C)] 98 F (36.7 C) (07/30 1200) Pulse Rate:  [62-106] 90 (07/30 1400) Resp:  [12-23] 22 (07/30 1400) BP: (104-143)/(54-75) 143/55 (07/30 1400) SpO2:  [93 %-100 %] 100 % (07/30 1400) Arterial Line BP: (108)/(54) 108/54 (07/29 1800) Weight:  [108.9 kg (240 lb 1.3 oz)] 108.9 kg (240 lb 1.3 oz) (07/30 0500)   Intake/Output: 07/29 0701 - 07/30 0700 In: 1490.2 [I.V.:1340.2; IV Piggyback:150] Out: 4375 [Urine:4375] Last BM Date: 03/06/17  Physical Exam: Constitutional: No acute distress Abdomen:  Soft, nondistended, does not appear to have pain on palpation, but patient currently does not communicate well.  Midline incision is clean, dry, intact with staples. Neuro:  Does not follow commands but is awake.  Labs:   Recent Labs  03/05/17 0430 03/06/17 0421  WBC 18.4* 21.6*  HGB 12.0* 12.4*  HCT 36.1* 36.9*  PLT 101* 101*    Recent Labs  03/05/17 0430 03/06/17 0421  NA 143 149*  K 3.4* 3.7  CL 122* 122*  CO2 19* 23  GLUCOSE 185* 224*  BUN 44* 42*  CREATININE 1.32* 1.39*  CALCIUM 7.3* 7.7*   No results for input(s): LABPROT, INR in the last 72 hours.  Imaging: Dg Chest Port 1 View  Result Date: 03/06/2017 CLINICAL DATA:  Respiratory failure . EXAM: PORTABLE CHEST 1 VIEW COMPARISON:  03/05/2017. FINDINGS: Interim extubation. Cardiac pacer noted with lead tips in right atrium right ventricle. Cardiomegaly with normal pulmonary vascularity. Low lung volumes with bibasilar atelectasis/infiltrate. Tiny bilateral pleural effusions cannot be excluded. No pneumothorax. IMPRESSION: 1.  Interim extubation and removal of NG tube. 2. Persistent bibasilar atelectasis/infiltrates. Tiny bilateral pleural effusions  cannot be excluded . Chest is unchanged from prior exam . 3.  Cardiac pacer in stable position.  Stable cardiomegaly. Electronically Signed   By: Maisie Fushomas  Register   On: 03/06/2017 07:10    Assessment/Plan: 76 yo male s/p exploratory laparotomy for acidosis.  Now the acidosis has normalized, though WBC remains elevated, but his abdominal exam is reassuring.  --continue TPN --as he remains off pressors and stable, and has had bowel function, would be ok to resume tube feeds via Dobhoff tube.  Can start trickle feeds tonight and advance as tolerated per nutrition recs for goal.     Alvin IllJose Luis Sun Wilensky, MD [facility]

## 2017-03-06 NOTE — Progress Notes (Signed)
Report called to Maddie, RN on 1C.  Dr. Aleen CampiPiscoya just present in ICU and gave order to insert dobbhoff tube and to start tube feeds vital HP at 10 cc/H and to continue TPN.

## 2017-03-06 NOTE — Progress Notes (Addendum)
Nutrition Follow-up  DOCUMENTATION CODES:   Obesity unspecified  INTERVENTION:  Advance Clinimix 5/15 E from 3640mL/hr to goal rate of 1683mL/hr Provides 1414 calories (73% estimated needs), 100 grams of protein (85% estimated needs), 1992mL fluid daily. Continue to hold lipids per ASPEN guidelines  Discussed with CCM, Monitor for initiation of enteral feeds per CCM/Surgery, patient should not be on TPN long term if he is able to tolerate EN.  NUTRITION DIAGNOSIS:   Inadequate oral intake related to inability to eat as evidenced by NPO status. -ongoing  GOAL:   Provide needs based on ASPEN/SCCM guidelines -progressing  MONITOR:   Vent status, Labs, Weight trends, TF tolerance, I & O's  REASON FOR ASSESSMENT:   Ventilator    ASSESSMENT:   76 year old male with PMHx of HTN, bipolar disorder who presented with N/V for 3 days found to have ischemic small bowel with small bowel pneumatosis in left lower abdomen. Sent to ICU with SOB secondary to PNA requiring intubation. Pt s/p exploratory laparotomy without SBR on 7/25.  Extubated, not following commands. Discussed in Rounds, with MD, and RN. Awaiting clearance from surgery and CCM to restart enteral feeds. Access: L IJ Re-estimated needs Weight up 24lb from admission.  Intake/Output Summary (Last 24 hours) at 03/06/17 1419 Last data filed at 03/06/17 1412  Gross per 24 hour  Intake          1559.64 ml  Output             2725 ml  Net         -1165.36 ml  18L Fluid Positive  Medications reviewed and include:  Novolog 0-9 Units Every 4 hours, Thiamine 100mg  Labs reviewed:  Na 149, BUN/Creatinine 42/1.39 Triglycerides 163, CBGs 177, 159   Diet Order:  .TPN (CLINIMIX-E) Adult  Skin:  Wound (see comment) (closed incision abdomen)  Last BM:  02/28/2017  Height:   Ht Readings from Last 1 Encounters:  03/01/17 5\' 8"  (1.727 m)    Weight:   Wt Readings from Last 1 Encounters:  03/06/17 240 lb 1.3 oz (108.9 kg)   Utilized initial weight of 216 (98.4 kg) for calculations  Ideal Body Weight:  70 kg  BMI:  Body mass index is 36.5 kg/m.  Estimated Nutritional Needs:   Kcal:  0981-19141926-2311 calories (25-30cal/kg ABW)  Protein:  118-147 grams (1.2-1.5g/kg)  Fluid:  1.8 L/day (25 ml/kg IBW)  EDUCATION NEEDS:   No education needs identified at this time  Alvin AnoWilliam M. Sofiah Lyne, MS, RD LDN Inpatient Clinical Dietitian Pager [phone]

## 2017-03-07 ENCOUNTER — Inpatient Hospital Stay: Payer: Medicare Other

## 2017-03-07 DIAGNOSIS — R410 Disorientation, unspecified: Secondary | ICD-10-CM

## 2017-03-07 DIAGNOSIS — F05 Delirium due to known physiological condition: Secondary | ICD-10-CM

## 2017-03-07 DIAGNOSIS — F319 Bipolar disorder, unspecified: Secondary | ICD-10-CM

## 2017-03-07 LAB — GLUCOSE, CAPILLARY
GLUCOSE-CAPILLARY: 143 mg/dL — AB (ref 65–99)
GLUCOSE-CAPILLARY: 161 mg/dL — AB (ref 65–99)
GLUCOSE-CAPILLARY: 171 mg/dL — AB (ref 65–99)
Glucose-Capillary: 151 mg/dL — ABNORMAL HIGH (ref 65–99)
Glucose-Capillary: 182 mg/dL — ABNORMAL HIGH (ref 65–99)
Glucose-Capillary: 200 mg/dL — ABNORMAL HIGH (ref 65–99)
Glucose-Capillary: 202 mg/dL — ABNORMAL HIGH (ref 65–99)

## 2017-03-07 LAB — CBC
HCT: 35.8 % — ABNORMAL LOW (ref 40.0–52.0)
HEMOGLOBIN: 12.1 g/dL — AB (ref 13.0–18.0)
MCH: 33.2 pg (ref 26.0–34.0)
MCHC: 33.8 g/dL (ref 32.0–36.0)
MCV: 98.1 fL (ref 80.0–100.0)
Platelets: 148 10*3/uL — ABNORMAL LOW (ref 150–440)
RBC: 3.65 MIL/uL — AB (ref 4.40–5.90)
RDW: 14.6 % — ABNORMAL HIGH (ref 11.5–14.5)
WBC: 26.4 10*3/uL — ABNORMAL HIGH (ref 3.8–10.6)

## 2017-03-07 LAB — BASIC METABOLIC PANEL
Anion gap: 4 — ABNORMAL LOW (ref 5–15)
BUN: 38 mg/dL — AB (ref 6–20)
CHLORIDE: 122 mmol/L — AB (ref 101–111)
CO2: 25 mmol/L (ref 22–32)
Calcium: 7.9 mg/dL — ABNORMAL LOW (ref 8.9–10.3)
Creatinine, Ser: 1.26 mg/dL — ABNORMAL HIGH (ref 0.61–1.24)
GFR calc Af Amer: >60 mL/min (ref >60)
GFR calc non Af Amer: 54 mL/min — ABNORMAL LOW (ref >60)
GLUCOSE: 215 mg/dL — AB (ref 65–99)
POTASSIUM: 3.1 mmol/L — AB (ref 3.5–5.1)
Sodium: 151 mmol/L — ABNORMAL HIGH (ref 135–145)

## 2017-03-07 LAB — PHOSPHORUS: Phosphorus: 2.5 mg/dL (ref 2.5–4.6)

## 2017-03-07 LAB — MAGNESIUM: MAGNESIUM: 2.3 mg/dL (ref 1.7–2.4)

## 2017-03-07 MED ORDER — POTASSIUM CL IN DEXTROSE 5% 20 MEQ/L IV SOLN
20.0000 meq | INTRAVENOUS | Status: DC
  Administered 2017-03-07 – 2017-03-08 (×2): 20 meq via INTRAVENOUS
  Filled 2017-03-07 (×4): qty 1000

## 2017-03-07 MED ORDER — OLANZAPINE 5 MG PO TBDP
15.0000 mg | ORAL_TABLET | Freq: Every day | ORAL | Status: DC
  Administered 2017-03-07 – 2017-03-16 (×9): 15 mg via ORAL
  Filled 2017-03-07 (×11): qty 1

## 2017-03-07 MED ORDER — PRO-STAT SUGAR FREE PO LIQD
30.0000 mL | Freq: Three times a day (TID) | ORAL | Status: DC
  Administered 2017-03-07 – 2017-03-10 (×6): 30 mL

## 2017-03-07 MED ORDER — VITAL 1.5 CAL PO LIQD
1000.0000 mL | ORAL | Status: DC
  Administered 2017-03-07: 1000 mL

## 2017-03-07 MED ORDER — POTASSIUM CHLORIDE 10 MEQ/100ML IV SOLN
10.0000 meq | INTRAVENOUS | Status: AC
  Administered 2017-03-07 (×3): 10 meq via INTRAVENOUS
  Filled 2017-03-07: qty 100

## 2017-03-07 MED ORDER — HALOPERIDOL LACTATE 5 MG/ML IJ SOLN: 2.5000 mg | Freq: Four times a day (QID) | INTRAMUSCULAR | Status: DC | PRN

## 2017-03-07 NOTE — Progress Notes (Signed)
Pt tolerating tube feedings- started on Jevity 1.5 at 15ml per hour at 1500. TPN stopped. IJ removed by pt- peripheral IV inserted by IV RN.

## 2017-03-07 NOTE — Progress Notes (Addendum)
On call MD notified pt pulled dobhoff tube almost all the way out of nose. Tube feedings immediately stopped. Re-inserted, oxygen saturations stable, STAT KUB ordered to check placement.

## 2017-03-07 NOTE — Progress Notes (Signed)
Dr. Cherlynn KaiserSainani notified patient pulled out left IJ despite safety mitts in place. Bleeding was stopped once RN arrived in room. Pressure and gauze applied for precautions. Will get IV team to place peripheral IV until I hear back if TPN will continue at this time per Dr. Cherlynn KaiserSainani.

## 2017-03-07 NOTE — Progress Notes (Signed)
Low bed placed in patient's room. Bed alarm on. Yellow arm band in place. Room near nurses station. Tele-sitter placed in room.   Patient's daughter updated on current plan of care for patient's safety.

## 2017-03-07 NOTE — Progress Notes (Signed)
IV Therapy consulted again after they placed IV due to pt positioning causing occlusion.

## 2017-03-07 NOTE — Progress Notes (Addendum)
Nutrition Follow-up  DOCUMENTATION CODES:   Obesity unspecified  INTERVENTION:  1. Spoke with surgery and hospitalist, can stop TPN and advance Tube feeds  2. Provide Vital 1.5 via NGT at 1015mL/hr, increase by 10 every 12 hours to goal rate of 8555mL/hr 3. Pro-stat 30mL TID, each supplement provides 100 calories and 15 grams protein At goal, Regimen provides 2280 calories, 134gm protein, 1008mL free water If patient is not receiving IV fluids, recommend 150mL free water 8 times daily Provides 2208mL free water  4. Continue Thiamine 100mg  daily  5. D/C Vital High Protein  NUTRITION DIAGNOSIS:   Inadequate oral intake related to inability to eat as evidenced by NPO status. -ongoing  GOAL:   Patient will meet greater than or equal to 90% of their needs -not meeting currently  MONITOR:   TF tolerance, I & O's, Labs, Weight trends, Skin   ASSESSMENT:   76 year old male with PMHx of HTN, bipolar disorder who presented with N/V for 3 days found to have ischemic small bowel with small bowel pneumatosis in left lower abdomen. Sent to ICU with SOB secondary to PNA requiring intubation. Pt s/p exploratory laparotomy without SBR on 7/25.  Continues to be confused. Mumbling "I want to drink." Pulled out IJ today with mitts on, now replaced with peripheral. NGT tip in mid stomach per x-ray  Intake/Output Summary (Last 24 hours) at 03/07/17 1315 Last data filed at 03/07/17 0917  Gross per 24 hour  Intake          1142.28 ml  Output             3325 ml  Net         -2182.72 ml  Still 16L fluid positive.  Medications reviewed and include:  Novolog 0-9 units every 4 hours D5 5120mEqK+ at 4975mL/hr --> 306 calories 10mEq K+ x4 via IV Labs reviewed:  CBGs 161, 200, 202 Na 151, K 3.1   Diet Order:  .TPN (CLINIMIX-E) Adult  Skin:  Wound (see comment) (closed incision abdomen)  Last BM:  03/06/2017 (Type 7)  Height:   Ht Readings from Last 1 Encounters:  03/01/17 5\' 8"  (1.727 m)     Weight:   Wt Readings from Last 1 Encounters:  03/07/17 242 lb 12.8 oz (110.1 kg)  Calculations based on original dry weight of 216 lbs (98.4 kg)  Ideal Body Weight:  70 kg  BMI:  Body mass index is 36.92 kg/m.  Estimated Nutritional Needs:   Kcal:  1610-96041926-2311 calories (25-30cal/kg ABW)  Protein:  118-147 grams (1.2-1.5g/kg)  Fluid:  1.8 L/day (25 ml/kg IBW)  EDUCATION NEEDS:   No education needs identified at this time  Dionne AnoWilliam M. Sapir Lavey, MS, RD LDN Inpatient Clinical Dietitian Pager [phone]

## 2017-03-07 NOTE — Care Management Important Message (Signed)
Important Message  Patient Details  Name: Alvin [name] MRN: [id number] Date of Birth: 08/13/1940   Medicare Important Message Given:  Yes    Gwenette GreetBrenda S Rai Sinagra, RN 03/07/2017, 8:14 AM

## 2017-03-07 NOTE — Progress Notes (Signed)
Increased agitation, fidgeting, trying to get out of bed, trying to remove safety mits in the past 30 minutes. Dr. Cherlynn KaiserSainani notified, see new orders.

## 2017-03-07 NOTE — Progress Notes (Signed)
Per Dr. Cherlynn KaiserSainani, TPN will not be restarted. Dietitian to advance tube feedings currently going at 3110ml/hr. Will monitor for new orders.

## 2017-03-07 NOTE — Progress Notes (Signed)
Inpatient Diabetes Program Recommendations  AACE/ADA: New Consensus Statement on Inpatient Glycemic Control (2015)  Target Ranges:  Prepandial:   less than 140 mg/dL      Peak postprandial:   less than 180 mg/dL (1-2 hours)      Critically ill patients:  140 - 180 mg/dL   Lab Results  Component Value Date   GLUCAP 200 (H) 03/07/2017    Review of Glycemic Control: Results for Alvin [name], Alvin [name] (MRN [id number]) as of 03/07/2017 10:19  Ref. Range 03/07/2017 00:00 03/07/2017 03:52 03/07/2017 07:24  Glucose-Capillary Latest Ref Range: 65 - 99 mg/dL 782182 (H) 956202 (H) 213200 (H)    Blood sugars greater than goal with TPN.   Inpatient Diabetes Program Recommendations:    Consider increasing Novolog correction to moderate q 4 hours.  Thanks, Beryl MeagerJenny Raymound Katich, RN, BC-ADM Inpatient Diabetes Coordinator Pager [phone] (8a-5p)

## 2017-03-07 NOTE — Progress Notes (Signed)
[facility] - [place] [facility]   PATIENT NAME: Alvin PeckJohn [name]    MR#:  [id number]  DATE OF BIRTH:  12/15/1940  SUBJECTIVE:   Transferred out of the ICU yesterday. Mental status has improved and follows some simple commands. Remains both on TPN and enteral feeds. Pulled out his central line by accident and discussed with surgery and will advance enteral nutrition and keep off TPN for now  REVIEW OF SYSTEMS:    Review of Systems  Unable to perform ROS: Mental acuity    Nutrition: Tube feeds Tolerating Diet: Yes Tolerating PT: Await Eval.  DRUG ALLERGIES:  No Known Allergies  VITALS:  Blood pressure (!) 156/71, pulse 72, temperature 99.1 F (37.3 C), temperature source Axillary, resp. rate 18, height 5\' 8"  (1.727 m), weight 110.1 kg (242 lb 12.8 oz), SpO2 92 %.  PHYSICAL EXAMINATION:   Physical Exam  GENERAL:  76 y.o.-year-old patient lying in bed in NAD and follows simple commands.   EYES: Right Pupil is fixed and dilated and not responsive to light. No scleral icterus.  HEENT: Head atraumatic, normocephalic. NG tube in place.    NECK:  Supple, no jugular venous distention. No thyroid enlargement, no tenderness.  LUNGS: Normal breath sounds bilaterally, no wheezing, rales, rhonchi. No use of accessory muscles of respiration.  CARDIOVASCULAR: S1, S2 normal. No murmurs, rubs, or gallops.  ABDOMEN: Soft, nontender, nondistended. Bowel sounds Hypoactive but present. No organomegaly or mass. + mid-abdomen staples in place with no acute drainage.  EXTREMITIES: No cyanosis, clubbing or edema b/l.    NEUROLOGIC: Globally weak and No focal motor or sensory deficits b/l.   PSYCHIATRIC: AAO X 1 and follows simple commands.  SKIN: No obvious rash, lesion, or ulcer.    LABORATORY PANEL:   CBC  Recent Labs Lab 03/07/17 0449  WBC 26.4*  HGB 12.1*  HCT 35.8*  PLT 148*    ------------------------------------------------------------------------------------------------------------------  Chemistries   Recent Labs Lab 03/06/17 0421 03/07/17 0449  NA 149* 151*  K 3.7 3.1*  CL 122* 122*  CO2 23 25  GLUCOSE 224* 215*  BUN 42* 38*  CREATININE 1.39* 1.26*  CALCIUM 7.7* 7.9*  MG 2.2 2.3  AST 47*  --   ALT 30  --   ALKPHOS 72  --   BILITOT 0.6  --    ------------------------------------------------------------------------------------------------------------------  Cardiac Enzymes  Recent Labs Lab 03/01/17 1356  TROPONINI 0.09*   ------------------------------------------------------------------------------------------------------------------  RADIOLOGY:  Dg Abd 1 View  Result Date: 03/06/2017 CLINICAL DATA:  Status post nasogastric feeding tube placement. EXAM: ABDOMEN - 1 VIEW COMPARISON:  03/04/2017.  Portable chest obtained earlier today. FINDINGS: The nasogastric tube has been replaced with a feeding tube with its tip in the mid stomach. The included bowel gas pattern is unremarkable. Dense left lower lobe airspace opacity without significant change. Small left pleural effusion. IMPRESSION: 1. Feeding tube tip in the mid stomach. 2. Stable dense left lower lobe atelectasis or pneumonia. 3. Small left pleural effusion. Electronically Signed   By: Beckie SaltsSteven  Reid M.D.   On: 03/06/2017 18:33   Dg Chest Port 1 View  Result Date: 03/06/2017 CLINICAL DATA:  Respiratory failure . EXAM: PORTABLE CHEST 1 VIEW COMPARISON:  03/05/2017. FINDINGS: Interim extubation. Cardiac pacer noted with lead tips in right atrium right ventricle. Cardiomegaly with normal pulmonary vascularity. Low lung volumes with bibasilar atelectasis/infiltrate. Tiny bilateral pleural effusions cannot be excluded. No pneumothorax. IMPRESSION: 1.  Interim extubation and removal of NG tube. 2. Persistent bibasilar atelectasis/infiltrates.  Tiny bilateral pleural effusions cannot be excluded  . Chest is unchanged from prior exam . 3.  Cardiac pacer in stable position.  Stable cardiomegaly. Electronically Signed   By: Maisie Fushomas  Register   On: 03/06/2017 07:10     ASSESSMENT AND PLAN:   76 year old male with past medical history of hypertension, bipolar disorder, who presented to the hospital due to abdominal pain, nausea or diarrhea and suspected to have ischemic bowel and status post exploratory laparotomy which was negative for ischemic bowel patient now is severe sepsis with septic shock secondary to pneumonia.  1. Severe sepsis with septic shock- secondary to pneumonia and suspected Aspiration Pneumonia.  - Improved and off vasopressors now. Continue Zosyn, follow white cell count fever curve and hemodynamics.  2. Acute respiratory failure-secondary to pneumonia/ARDS. -Extubated yesterday and now on room air with O2 sats in the low 90s and clinically stable. Continue aggressive pulmonary toileting, continue antibiotics. Likely will need a speech eval and mental status improves.  3. Abdominal pain/distention-patient is status post exploratory laparotomy showing no evidence of ischemic bowel. -Discussed with general surgery today and will advance tube feedings and DC TPN for now. -Continue further care as per general surgery.  4. Hypernatremia - will start on D5W and follow sodium.   5. Hypokalemia - will replace and repeat level in a.m.  - check Mg. Level.   6. Elevated troponin-secondary to supply demand ischemia, No evidence of acute coronary syndrome.  7. Leukocytosis-secondary to sepsis - trending up and will cont. To monitor. Cont. IV abx as mentioned above.   8. Hx of Bipolar Disorder - cont. Depakote.   9. Hyperlipidemia - cont. Atorvastatin  10. Hx of Gout - cont. Allopurinol.   11. Agitation/AMS - mental status improving.  Off precedex now.  If needed will consider Psych consult.   All the records are reviewed and case discussed with Care Management/Social  Worker. Management plans discussed with the patient, family and they are in agreement.  CODE STATUS: DNR  DVT Prophylaxis: Hep SQ  TOTAL TIME TAKING CARE OF THIS PATIENT: 30 minutes.   POSSIBLE D/C unclear, DEPENDING ON CLINICAL CONDITION.   [name],[name] M.D on 03/07/2017 at 1:49 PM  Between 7am to 6pm - Pager - [phone]  After 6pm go to [email or web address] - Social research officer, governmentpassword EPAS [email or web address]  [facility] [place] [facility]  Office  [phone]  CC: Primary care physician; System, Pcp Not In

## 2017-03-07 NOTE — Clinical Social Work Note (Signed)
Pt has transferred to 1C. CSW is continuing to follow for discharge planning needs. Disposition is pending. CSW will continue to follow and is available for support.   Dede QuerySarah Yasira Engelson, MSW, LCSW  Clincial Social Worker  [phone]

## 2017-03-07 NOTE — Consult Note (Signed)
[facility] Consult   Reason for Consult:  Consult for 76 year old man with a history of bipolar disorder currently confused and agitated Referring Physician:  Verdell Carmine Patient Identification: Alvin [name] MRN:  [id number] Principal Diagnosis: Subacute delirium Diagnosis:   Patient Active Problem List   Diagnosis Date Noted  . Subacute delirium [F05] 03/07/2017  . Bipolar disorder ([place]) [F31.9] 03/07/2017  . 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 Acute respiratory failure with hypoxemia ([place]) [J96.01]   . Abdominal distention [R14.0]   . Lactic acidosis [E87.2]   . Elevated lipase [R74.8]   . Severe sepsis with septic shock ([place]) [A41.9, R65.21] 02/28/2017    Total Time spent with patient: 1 hour  Subjective:   Alvin [name] is a 76 y.o. male patient admitted with "get these off me".  HPI:  Patient interviewed chart reviewed. 76 year old man with a reported past history of bipolar disorder was admitted to the hospital from the [facility] with acute illness including abdominal pain. Patient had exploratory laparotomy which was unrevealing but has subsequently developed pneumonia. Patient is now out of the intensive care unit but remains confused and agitated. Has pulled out lines and is requiring mittens and special precautions for safety. Patient was interviewed but was unable to give any information. He repeats himself frequently and could not answer questions lucidly. Judging from the past history prior to admission the patient was thought to be a lucid state of mind and was fairly independent at the [facility]. Currently he is receiving nutrition by an NG tube and is not able to get his oral medicines as well as previously.  Social history: Patient lives at the [place]. Adult children are involved making decisions. He also has a history of being a veteran but apparently the family has preferred not to go to the [facility].  Medical history: Patient is recovering from  pneumonia and sepsis among other medical problems  Substance abuse history: No acute or recent substance abuse issues    Past Psychiatric History: Reported history of bipolar disorder and was on modest dose of Depakote and olanzapine prior to admission. Was described as having been psychiatrically stable. Evidently has had treatment at the [facility] in the past. Unclear when and if ever he has had a last hospitalization unknown if he is ever had suicide attempts  Risk to Self: Is patient at risk for suicide?: No Risk to Others:   Prior Inpatient Therapy:   Prior Outpatient Therapy:    Past Medical History:  Past Medical History:  Diagnosis Date  . Bipolar disorder ([place])   . Hypertension     Past Surgical History:  Procedure Laterality Date  . LAPAROTOMY N/A 03/01/2017   Procedure: EXPLORATORY LAPAROTOMY;  Surgeon: Vickie Epley, MD;  Location: [facility] ORS;  Service: General;  Laterality: N/A;   Family History: History reviewed. No pertinent family history. Family Psychiatric  History: Unknown Social History:  History  Alcohol Use No     History  Drug Use No    Social History   Social History  . Marital status: Divorced    Spouse name: N/A  . Number of children: N/A  . Years of education: N/A   Social History Main Topics  . Smoking status: Never Smoker  . Smokeless tobacco: Never Used  . Alcohol use No  . Drug use: No  . Sexual activity: Not Asked   Other Topics Concern  . None   Social History Narrative  . None   Additional Social  History:    Allergies:  No Known Allergies  Labs:  Results for orders placed or performed during the hospital encounter of 02/28/17 (from the past 48 hour(s))  Glucose, capillary     Status: Abnormal   Collection Time: 03/05/17 11:26 PM  Result Value Ref Range   Glucose-Capillary 175 (H) 65 - 99 mg/dL  Glucose, capillary     Status: Abnormal   Collection Time: 03/06/17  4:13 AM  Result Value Ref Range   Glucose-Capillary  164 (H) 65 - 99 mg/dL  Triglycerides     Status: Abnormal   Collection Time: 03/06/17  4:21 AM  Result Value Ref Range   Triglycerides 163 (H) <150 mg/dL  Prealbumin     Status: Abnormal   Collection Time: 03/06/17  4:21 AM  Result Value Ref Range   Prealbumin 5.1 (L) 18 - 38 mg/dL    Comment: Performed at [facility] [address] [place], [place] [address]  Comprehensive metabolic panel     Status: Abnormal   Collection Time: 03/06/17  4:21 AM  Result Value Ref Range   Sodium 149 (H) 135 - 145 mmol/L   Potassium 3.7 3.5 - 5.1 mmol/L   Chloride 122 (H) 101 - 111 mmol/L   CO2 23 22 - 32 mmol/L   Glucose, Bld 224 (H) 65 - 99 mg/dL   BUN 42 (H) 6 - 20 mg/dL   Creatinine, Ser 1.39 (H) 0.61 - 1.24 mg/dL   Calcium 7.7 (L) 8.9 - 10.3 mg/dL   Total Protein 4.4 (L) 6.5 - 8.1 g/dL   Albumin 1.6 (L) 3.5 - 5.0 g/dL   AST 47 (H) 15 - 41 U/L   ALT 30 17 - 63 U/L   Alkaline Phosphatase 72 38 - 126 U/L   Total Bilirubin 0.6 0.3 - 1.2 mg/dL   GFR calc non Af Amer 48 (L) >60 mL/min   GFR calc Af Amer 56 (L) >60 mL/min    Comment: (NOTE) The eGFR has been calculated using the CKD EPI equation. This calculation has not been validated in all clinical situations. eGFR's persistently <60 mL/min signify possible Chronic Kidney Disease.    Anion gap 4 (L) 5 - 15  CBC with Differential/Platelet     Status: Abnormal   Collection Time: 03/06/17  4:21 AM  Result Value Ref Range   WBC 21.6 (H) 3.8 - 10.6 K/uL   RBC 3.73 (L) 4.40 - 5.90 MIL/uL   Hemoglobin 12.4 (L) 13.0 - 18.0 g/dL   HCT 36.9 (L) 40.0 - 52.0 %   MCV 98.7 80.0 - 100.0 fL   MCH 33.2 26.0 - 34.0 pg   MCHC 33.6 32.0 - 36.0 g/dL   RDW 14.6 (H) 11.5 - 14.5 %   Platelets 101 (L) 150 - 440 K/uL   Neutrophils Relative % 91 %   Neutro Abs 19.6 (H) 1.4 - 6.5 K/uL   Lymphocytes Relative 3 %   Lymphs Abs 0.7 (L) 1.0 - 3.6 K/uL   Monocytes Relative 5 %   Monocytes Absolute 1.1 (H) 0.2 - 1.0 K/uL   Eosinophils Relative 0 %    Eosinophils Absolute 0.0 0 - 0.7 K/uL   Basophils Relative 1 %   Basophils Absolute 0.1 0 - 0.1 K/uL   Smear Review MORPHOLOGY UNREMARKABLE   Magnesium     Status: None   Collection Time: 03/06/17  4:21 AM  Result Value Ref Range   Magnesium 2.2 1.7 - 2.4 mg/dL  Phosphorus  Status: None   Collection Time: 03/06/17  4:21 AM  Result Value Ref Range   Phosphorus 3.1 2.5 - 4.6 mg/dL  Glucose, capillary     Status: Abnormal   Collection Time: 03/06/17  7:18 AM  Result Value Ref Range   Glucose-Capillary 177 (H) 65 - 99 mg/dL  Glucose, capillary     Status: Abnormal   Collection Time: 03/06/17 11:28 AM  Result Value Ref Range   Glucose-Capillary 159 (H) 65 - 99 mg/dL  Glucose, capillary     Status: Abnormal   Collection Time: 03/06/17  4:34 PM  Result Value Ref Range   Glucose-Capillary 176 (H) 65 - 99 mg/dL  Glucose, capillary     Status: Abnormal   Collection Time: 03/06/17  7:37 PM  Result Value Ref Range   Glucose-Capillary 164 (H) 65 - 99 mg/dL  Glucose, capillary     Status: Abnormal   Collection Time: 03/07/17 12:00 AM  Result Value Ref Range   Glucose-Capillary 182 (H) 65 - 99 mg/dL  Glucose, capillary     Status: Abnormal   Collection Time: 03/07/17  3:52 AM  Result Value Ref Range   Glucose-Capillary 202 (H) 65 - 99 mg/dL  Magnesium     Status: None   Collection Time: 03/07/17  4:49 AM  Result Value Ref Range   Magnesium 2.3 1.7 - 2.4 mg/dL  Phosphorus     Status: None   Collection Time: 03/07/17  4:49 AM  Result Value Ref Range   Phosphorus 2.5 2.5 - 4.6 mg/dL  CBC     Status: Abnormal   Collection Time: 03/07/17  4:49 AM  Result Value Ref Range   WBC 26.4 (H) 3.8 - 10.6 K/uL   RBC 3.65 (L) 4.40 - 5.90 MIL/uL   Hemoglobin 12.1 (L) 13.0 - 18.0 g/dL   HCT 35.8 (L) 40.0 - 52.0 %   MCV 98.1 80.0 - 100.0 fL   MCH 33.2 26.0 - 34.0 pg   MCHC 33.8 32.0 - 36.0 g/dL   RDW 14.6 (H) 11.5 - 14.5 %   Platelets 148 (L) 150 - 440 K/uL  Basic metabolic panel     Status:  Abnormal   Collection Time: 03/07/17  4:49 AM  Result Value Ref Range   Sodium 151 (H) 135 - 145 mmol/L   Potassium 3.1 (L) 3.5 - 5.1 mmol/L   Chloride 122 (H) 101 - 111 mmol/L   CO2 25 22 - 32 mmol/L   Glucose, Bld 215 (H) 65 - 99 mg/dL   BUN 38 (H) 6 - 20 mg/dL   Creatinine, Ser 1.26 (H) 0.61 - 1.24 mg/dL   Calcium 7.9 (L) 8.9 - 10.3 mg/dL   GFR calc non Af Amer 54 (L) >60 mL/min   GFR calc Af Amer >60 >60 mL/min    Comment: (NOTE) The eGFR has been calculated using the CKD EPI equation. This calculation has not been validated in all clinical situations. eGFR's persistently <60 mL/min signify possible Chronic Kidney Disease.    Anion gap 4 (L) 5 - 15  Glucose, capillary     Status: Abnormal   Collection Time: 03/07/17  7:24 AM  Result Value Ref Range   Glucose-Capillary 200 (H) 65 - 99 mg/dL  Glucose, capillary     Status: Abnormal   Collection Time: 03/07/17 11:53 AM  Result Value Ref Range   Glucose-Capillary 161 (H) 65 - 99 mg/dL  Glucose, capillary     Status: Abnormal   Collection Time: 03/07/17  4:46 PM  Result Value Ref Range   Glucose-Capillary 151 (H) 65 - 99 mg/dL    Current Facility-Administered Medications  Medication Dose Route Frequency Provider Last Rate Last Dose  . acetaminophen (TYLENOL) tablet 650 mg  650 mg Oral Q6H PRN Hugelmeyer, Alexis, DO       Or  . acetaminophen (TYLENOL) suppository 650 mg  650 mg Rectal Q6H PRN Hugelmeyer, Alexis, DO      . albuterol (PROVENTIL) (2.5 MG/3ML) 0.083% nebulizer solution 2.5 mg  2.5 mg Nebulization Q6H Wilhelmina Mcardle, MD   2.5 mg at 03/07/17 1949  . bisacodyl (DULCOLAX) EC tablet 5 mg  5 mg Oral Daily PRN Hugelmeyer, Alexis, DO   5 mg at 03/03/17 0916  . dextrose 5 % with KCl 20 mEq / L  infusion  20 mEq Intravenous Continuous Henreitta Leber, MD 75 mL/hr at 03/07/17 1247 20 mEq at 03/07/17 1247  . feeding supplement (PRO-STAT SUGAR FREE 64) liquid 30 mL  30 mL Per Tube TID Piscoya, Jose, MD   30 mL at 03/07/17  1501  . feeding supplement (VITAL 1.5 CAL) liquid 1,000 mL  1,000 mL Per Tube Continuous Piscoya, Jose, MD 55 mL/hr at 03/07/17 1501 1,000 mL at 03/07/17 1501  . fentaNYL (SUBLIMAZE) injection 25 mcg  25 mcg Intravenous Q2H PRN Wilhelmina Mcardle, MD      . haloperidol lactate (HALDOL) injection 2.5 mg  2.5 mg Intravenous Q6H PRN Henreitta Leber, MD      . heparin injection 5,000 Units  5,000 Units Subcutaneous Q8H Laverle Hobby, MD   5,000 Units at 03/07/17 1501  . insulin aspart (novoLOG) injection 0-9 Units  0-9 Units Subcutaneous Q4H Laverle Hobby, MD   2 Units at 03/07/17 1750  . OLANZapine zydis (ZYPREXA) disintegrating tablet 15 mg  15 mg Oral QHS Clapacs, Swan T, MD      . ondansetron (ZOFRAN) injection 4 mg  4 mg Intravenous Q6H PRN Hugelmeyer, Alexis, DO      . piperacillin-tazobactam (ZOSYN) IVPB 3.375 g  3.375 g Intravenous Q8H Wilhelmina Mcardle, MD 12.5 mL/hr at 03/07/17 1501 3.375 g at 03/07/17 1501  . senna-docusate (Senokot-S) tablet 1 tablet  1 tablet Oral QHS PRN Hugelmeyer, Alexis, DO      . sodium chloride flush (NS) 0.9 % injection 10-40 mL  10-40 mL Intracatheter Q12H Varughese, Bincy S, NP   10 mL at 03/07/17 0853  . sodium chloride flush (NS) 0.9 % injection 10-40 mL  10-40 mL Intracatheter PRN Varughese, Bincy S, NP      . sodium chloride flush (NS) 0.9 % injection 3 mL  3 mL Intravenous Q12H Hugelmeyer, Alexis, DO   3 mL at 03/07/17 0853  . thiamine (B-1) injection 100 mg  100 mg Intravenous Daily Pernell Dupre, RPH   100 mg at 03/07/17 3976  . valproate (DEPACON) 250 mg in dextrose 5 % 50 mL IVPB  250 mg Intravenous TID Henreitta Leber, MD   Stopped at 03/07/17 7341    Musculoskeletal: Strength & Muscle Tone: decreased Gait & Station: unable to stand Patient leans: N/A  Psychiatric Specialty Exam: Physical Exam  Nursing note and vitals reviewed. Constitutional: He appears well-developed and well-nourished. He appears distressed.  HENT:  Head:  Normocephalic and atraumatic.  Eyes: Pupils are equal, round, and reactive to light. Conjunctivae are normal.  Neck: Normal range of motion.  Cardiovascular: Normal heart sounds.   Respiratory: Effort normal.  GI: Soft.  Musculoskeletal: Normal  range of motion.  Neurological: He is alert.  Skin: Skin is warm and dry.  Psychiatric: His affect is labile. His speech is delayed and tangential. He is agitated. Cognition and memory are impaired. He expresses impulsivity. He expresses no homicidal and no suicidal ideation. He is noncommunicative.    Review of Systems  Unable to perform ROS: Mental status change    Blood pressure (!) 152/76, pulse 72, temperature 98.7 F (37.1 C), temperature source Oral, resp. rate 18, height 5' 8"  (1.727 m), weight 242 lb 12.8 oz (110.1 kg), SpO2 95 %.Body mass index is 36.92 kg/m.  General Appearance: Disheveled  Eye Contact:  Minimal  Speech:  Garbled  Volume:  Decreased  Mood:  Dysphoric  Affect:  Congruent  Thought Process:  Disorganized  Orientation:  Negative  Thought Content:  Illogical and Rumination  Suicidal Thoughts:  No  Homicidal Thoughts:  No  Memory:  Negative  Judgement:  Negative  Insight:  Negative  Psychomotor Activity:  Restlessness  Concentration:  Concentration: Poor  Recall:  Poor  Fund of Knowledge:  Poor  Language:  Fair  Akathisia:  No  Handed:  Right  AIMS (if indicated):     Assets:  Financial Resources/Insurance Housing Social Support  ADL's:  Impaired  Cognition:  Impaired,  Moderate  Sleep:        Treatment Plan Summary: Daily contact with patient to assess and evaluate symptoms and progress in treatment, Medication management and Plan 76 year old man with history of bipolar disorder. His Depakote is being substituted with intravenous valproic acid but his antipsychotics are being held. Zyprexa comes in the oral dissolving form which is very useful for giving through NG tube. I am putting in the order to  restart his 15 mg of Zyprexa at night which can be given by a bolus in water through his NG tube easily. We will continue to follow-up. Expect that delirium should improve as he continues to heal up from his medical problems.  Disposition: Patient does not meet criteria for psychiatric inpatient admission. Supportive therapy provided about ongoing stressors.  Alethia Berthold, MD 03/07/2017 8:17 PM

## 2017-03-08 ENCOUNTER — Inpatient Hospital Stay: Payer: Medicare Other

## 2017-03-08 ENCOUNTER — Inpatient Hospital Stay
Admit: 2017-03-08 | Discharge: 2017-03-08 | Disposition: A | Discharge status: Home / Self Care | Payer: Medicare Other | Attending: Internal Medicine | Admitting: Internal Medicine

## 2017-03-08 LAB — GLUCOSE, CAPILLARY
GLUCOSE-CAPILLARY: 147 mg/dL — AB (ref 65–99)
GLUCOSE-CAPILLARY: 222 mg/dL — AB (ref 65–99)
GLUCOSE-CAPILLARY: 165 mg/dL — AB (ref 65–99)
GLUCOSE-CAPILLARY: 145 mg/dL — AB (ref 65–99)
Glucose-Capillary: 192 mg/dL — ABNORMAL HIGH (ref 65–99)
Glucose-Capillary: 166 mg/dL — ABNORMAL HIGH (ref 65–99)

## 2017-03-08 LAB — BASIC METABOLIC PANEL
ANION GAP: 6 (ref 5–15)
BUN: 31 mg/dL — AB (ref 6–20)
CALCIUM: 7.8 mg/dL — AB (ref 8.9–10.3)
CO2: 23 mmol/L (ref 22–32)
Chloride: 117 mmol/L — ABNORMAL HIGH (ref 101–111)
Creatinine, Ser: 1.05 mg/dL (ref 0.61–1.24)
GFR calc Af Amer: >60 mL/min (ref >60)
GFR calc non Af Amer: >60 mL/min (ref >60)
GLUCOSE: 207 mg/dL — AB (ref 65–99)
Potassium: 3.6 mmol/L (ref 3.5–5.1)
Sodium: 146 mmol/L — ABNORMAL HIGH (ref 135–145)

## 2017-03-08 LAB — CBC
HCT: 36.7 % — ABNORMAL LOW (ref 40.0–52.0)
HEMOGLOBIN: 12.8 g/dL — AB (ref 13.0–18.0)
MCH: 33.8 pg (ref 26.0–34.0)
MCHC: 35 g/dL (ref 32.0–36.0)
MCV: 96.7 fL (ref 80.0–100.0)
PLATELETS: 198 10*3/uL (ref 150–440)
RBC: 3.8 MIL/uL — ABNORMAL LOW (ref 4.40–5.90)
RDW: 14.1 % (ref 11.5–14.5)
WBC: 25.8 10*3/uL — ABNORMAL HIGH (ref 3.8–10.6)

## 2017-03-08 LAB — PROCALCITONIN: Procalcitonin: 1.07 ng/mL

## 2017-03-08 MED ORDER — METOCLOPRAMIDE HCL 5 MG/ML IJ SOLN
10.0000 mg | Freq: Three times a day (TID) | INTRAMUSCULAR | Status: DC | PRN
  Administered 2017-03-08: 10 mg via INTRAVENOUS
  Filled 2017-03-08: qty 2

## 2017-03-08 MED ORDER — FUROSEMIDE 10 MG/ML IJ SOLN
20.0000 mg | Freq: Once | INTRAMUSCULAR | Status: AC
  Administered 2017-03-08: 20 mg via INTRAVENOUS
  Filled 2017-03-08: qty 2

## 2017-03-08 MED ORDER — PIPERACILLIN-TAZOBACTAM 3.375 G IVPB
3.3750 g | Freq: Three times a day (TID) | INTRAVENOUS | Status: DC
  Administered 2017-03-08 – 2017-03-09 (×2): 3.375 g via INTRAVENOUS
  Filled 2017-03-08 (×3): qty 50

## 2017-03-08 MED ORDER — OXYCODONE-ACETAMINOPHEN 5-325 MG PO TABS
1.0000 | ORAL_TABLET | Freq: Four times a day (QID) | ORAL | Status: DC | PRN
  Administered 2017-03-08: 12:00:00 1
  Filled 2017-03-08: qty 1

## 2017-03-08 NOTE — Consult Note (Signed)
[facility] Consult   Reason for Consult:  Consult for 76 year old man with a history of bipolar disorder currently confused and agitated Referring Physician:  Verdell Carmine Patient Identification: Alvin [name] MRN:  [id number] Principal Diagnosis: Subacute delirium Diagnosis:   Patient Active Problem List   Diagnosis Date Noted  . Subacute delirium [F05] 03/07/2017  . Bipolar disorder ([place]) [F31.9] 03/07/2017  . 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 Acute respiratory failure with hypoxemia ([place]) [J96.01]   . Abdominal distention [R14.0]   . Lactic acidosis [E87.2]   . Elevated lipase [R74.8]   . Severe sepsis with septic shock ([place]) [A41.9, R65.21] 02/28/2017    Total Time spent with patient: 20 minutes  Subjective:   Alvin [name] is a 76 y.o. male patient admitted with "get these off me".  Follow-up for this patient with a history of bipolar disorder currently delirious recovering from serious medical problems. Patient was awake. He was still not able to speak in a lucid manner or answer direct questions or follow any commands. He was not agitated did not appear to be violent or trying to crawl out of bed.  HPI:  Patient interviewed chart reviewed. 76 year old man with a reported past history of bipolar disorder was admitted to the hospital from the [facility] with acute illness including abdominal pain. Patient had exploratory laparotomy which was unrevealing but has subsequently developed pneumonia. Patient is now out of the intensive care unit but remains confused and agitated. Has pulled out lines and is requiring mittens and special precautions for safety. Patient was interviewed but was unable to give any information. He repeats himself frequently and could not answer questions lucidly. Judging from the past history prior to admission the patient was thought to be a lucid state of mind and was fairly independent at the [place]. Currently he is receiving nutrition by  an NG tube and is not able to get his oral medicines as well as previously.  Social history: Patient lives at the [place]. Adult children are involved making decisions. He also has a history of being a veteran but apparently the family has preferred not to go to the [facility].  Medical history: Patient is recovering from pneumonia and sepsis among other medical problems  Substance abuse history: No acute or recent substance abuse issues    Past Psychiatric History: Reported history of bipolar disorder and was on modest dose of Depakote and olanzapine prior to admission. Was described as having been psychiatrically stable. Evidently has had treatment at the [facility] in the past. Unclear when and if ever he has had a last hospitalization unknown if he is ever had suicide attempts  Risk to Self: Is patient at risk for suicide?: No Risk to Others:   Prior Inpatient Therapy:   Prior Outpatient Therapy:    Past Medical History:  Past Medical History:  Diagnosis Date  . Bipolar disorder ([place])   . Hypertension     Past Surgical History:  Procedure Laterality Date  . LAPAROTOMY N/A 03/01/2017   Procedure: EXPLORATORY LAPAROTOMY;  Surgeon: Vickie Epley, MD;  Location: [facility] ORS;  Service: General;  Laterality: N/A;   Family History: History reviewed. No pertinent family history. Family Psychiatric  History: Unknown Social History:  History  Alcohol Use No     History  Drug Use No    Social History   Social History  . Marital status: Divorced    Spouse name: N/A  . Number of children: N/A  . Years  of education: N/A   Social History Main Topics  . Smoking status: Never Smoker  . Smokeless tobacco: Never Used  . Alcohol use No  . Drug use: No  . Sexual activity: Not Asked   Other Topics Concern  . None   Social History Narrative  . None   Additional Social History:    Allergies:  No Known Allergies  Labs:  Results for orders placed or performed during the  hospital encounter of 02/28/17 (from the past 48 hour(s))  Glucose, capillary     Status: Abnormal   Collection Time: 03/06/17  7:37 PM  Result Value Ref Range   Glucose-Capillary 164 (H) 65 - 99 mg/dL  Glucose, capillary     Status: Abnormal   Collection Time: 03/07/17 12:00 AM  Result Value Ref Range   Glucose-Capillary 182 (H) 65 - 99 mg/dL  Glucose, capillary     Status: Abnormal   Collection Time: 03/07/17  3:52 AM  Result Value Ref Range   Glucose-Capillary 202 (H) 65 - 99 mg/dL  Magnesium     Status: None   Collection Time: 03/07/17  4:49 AM  Result Value Ref Range   Magnesium 2.3 1.7 - 2.4 mg/dL  Phosphorus     Status: None   Collection Time: 03/07/17  4:49 AM  Result Value Ref Range   Phosphorus 2.5 2.5 - 4.6 mg/dL  CBC     Status: Abnormal   Collection Time: 03/07/17  4:49 AM  Result Value Ref Range   WBC 26.4 (H) 3.8 - 10.6 K/uL   RBC 3.65 (L) 4.40 - 5.90 MIL/uL   Hemoglobin 12.1 (L) 13.0 - 18.0 g/dL   HCT 35.8 (L) 40.0 - 52.0 %   MCV 98.1 80.0 - 100.0 fL   MCH 33.2 26.0 - 34.0 pg   MCHC 33.8 32.0 - 36.0 g/dL   RDW 14.6 (H) 11.5 - 14.5 %   Platelets 148 (L) 150 - 440 K/uL  Basic metabolic panel     Status: Abnormal   Collection Time: 03/07/17  4:49 AM  Result Value Ref Range   Sodium 151 (H) 135 - 145 mmol/L   Potassium 3.1 (L) 3.5 - 5.1 mmol/L   Chloride 122 (H) 101 - 111 mmol/L   CO2 25 22 - 32 mmol/L   Glucose, Bld 215 (H) 65 - 99 mg/dL   BUN 38 (H) 6 - 20 mg/dL   Creatinine, Ser 1.26 (H) 0.61 - 1.24 mg/dL   Calcium 7.9 (L) 8.9 - 10.3 mg/dL   GFR calc non Af Amer 54 (L) >60 mL/min   GFR calc Af Amer >60 >60 mL/min    Comment: (NOTE) The eGFR has been calculated using the CKD EPI equation. This calculation has not been validated in all clinical situations. eGFR's persistently <60 mL/min signify possible Chronic Kidney Disease.    Anion gap 4 (L) 5 - 15  Glucose, capillary     Status: Abnormal   Collection Time: 03/07/17  7:24 AM  Result Value Ref  Range   Glucose-Capillary 200 (H) 65 - 99 mg/dL  Glucose, capillary     Status: Abnormal   Collection Time: 03/07/17 11:53 AM  Result Value Ref Range   Glucose-Capillary 161 (H) 65 - 99 mg/dL  Glucose, capillary     Status: Abnormal   Collection Time: 03/07/17  4:46 PM  Result Value Ref Range   Glucose-Capillary 151 (H) 65 - 99 mg/dL  Glucose, capillary     Status: Abnormal  Collection Time: 03/07/17  8:34 PM  Result Value Ref Range   Glucose-Capillary 171 (H) 65 - 99 mg/dL  Glucose, capillary     Status: Abnormal   Collection Time: 03/07/17 11:52 PM  Result Value Ref Range   Glucose-Capillary 143 (H) 65 - 99 mg/dL  Glucose, capillary     Status: Abnormal   Collection Time: 03/08/17  3:58 AM  Result Value Ref Range   Glucose-Capillary 147 (H) 65 - 99 mg/dL  Basic metabolic panel     Status: Abnormal   Collection Time: 03/08/17  6:24 AM  Result Value Ref Range   Sodium 146 (H) 135 - 145 mmol/L   Potassium 3.6 3.5 - 5.1 mmol/L   Chloride 117 (H) 101 - 111 mmol/L   CO2 23 22 - 32 mmol/L   Glucose, Bld 207 (H) 65 - 99 mg/dL   BUN 31 (H) 6 - 20 mg/dL   Creatinine, Ser 1.05 0.61 - 1.24 mg/dL   Calcium 7.8 (L) 8.9 - 10.3 mg/dL   GFR calc non Af Amer >60 >60 mL/min   GFR calc Af Amer >60 >60 mL/min    Comment: (NOTE) The eGFR has been calculated using the CKD EPI equation. This calculation has not been validated in all clinical situations. eGFR's persistently <60 mL/min signify possible Chronic Kidney Disease.    Anion gap 6 5 - 15  CBC     Status: Abnormal   Collection Time: 03/08/17  6:24 AM  Result Value Ref Range   WBC 25.8 (H) 3.8 - 10.6 K/uL   RBC 3.80 (L) 4.40 - 5.90 MIL/uL   Hemoglobin 12.8 (L) 13.0 - 18.0 g/dL   HCT 36.7 (L) 40.0 - 52.0 %   MCV 96.7 80.0 - 100.0 fL   MCH 33.8 26.0 - 34.0 pg   MCHC 35.0 32.0 - 36.0 g/dL   RDW 14.1 11.5 - 14.5 %   Platelets 198 150 - 440 K/uL  Glucose, capillary     Status: Abnormal   Collection Time: 03/08/17  6:42 AM  Result  Value Ref Range   Glucose-Capillary 192 (H) 65 - 99 mg/dL  Glucose, capillary     Status: Abnormal   Collection Time: 03/08/17  8:02 AM  Result Value Ref Range   Glucose-Capillary 166 (H) 65 - 99 mg/dL  Glucose, capillary     Status: Abnormal   Collection Time: 03/08/17 11:46 AM  Result Value Ref Range   Glucose-Capillary 222 (H) 65 - 99 mg/dL  Procalcitonin - Baseline     Status: None   Collection Time: 03/08/17  2:22 PM  Result Value Ref Range   Procalcitonin 1.07 ng/mL    Comment:        Interpretation: PCT > 0.5 ng/mL and <= 2 ng/mL: Systemic infection (sepsis) is possible, but other conditions are known to elevate PCT as well. (NOTE)         ICU PCT Algorithm               Non ICU PCT Algorithm    ----------------------------     ------------------------------         PCT < 0.25 ng/mL                 PCT < 0.1 ng/mL     Stopping of antibiotics            Stopping of antibiotics       strongly encouraged.  strongly encouraged.    ----------------------------     ------------------------------       PCT level decrease by               PCT < 0.25 ng/mL       >= 80% from peak PCT       OR PCT 0.25 - 0.5 ng/mL          Stopping of antibiotics                                             encouraged.     Stopping of antibiotics           encouraged.    ----------------------------     ------------------------------       PCT level decrease by              PCT >= 0.25 ng/mL       < 80% from peak PCT        AND PCT >= 0.5 ng/mL             Continuing antibiotics                                              encouraged.       Continuing antibiotics            encouraged.    ----------------------------     ------------------------------     PCT level increase compared          PCT > 0.5 ng/mL         with peak PCT AND          PCT >= 0.5 ng/mL             Escalation of antibiotics                                          strongly encouraged.      Escalation of  antibiotics        strongly encouraged.   Glucose, capillary     Status: Abnormal   Collection Time: 03/08/17  5:23 PM  Result Value Ref Range   Glucose-Capillary 165 (H) 65 - 99 mg/dL    Current Facility-Administered Medications  Medication Dose Route Frequency Provider Last Rate Last Dose  . acetaminophen (TYLENOL) tablet 650 mg  650 mg Oral Q6H PRN Hugelmeyer, Alexis, DO       Or  . acetaminophen (TYLENOL) suppository 650 mg  650 mg Rectal Q6H PRN Hugelmeyer, Alexis, DO      . albuterol (PROVENTIL) (2.5 MG/3ML) 0.083% nebulizer solution 2.5 mg  2.5 mg Nebulization Q6H Wilhelmina Mcardle, MD   2.5 mg at 03/08/17 1332  . bisacodyl (DULCOLAX) EC tablet 5 mg  5 mg Oral Daily PRN Hugelmeyer, Alexis, DO   5 mg at 03/03/17 0916  . feeding supplement (PRO-STAT SUGAR FREE 64) liquid 30 mL  30 mL Per Tube TID Piscoya, Jose, MD   30 mL at 03/08/17 1018  . feeding supplement (VITAL 1.5 CAL) liquid 1,000 mL  1,000 mL Per Tube Continuous Olean Ree, MD 35 mL/hr at 03/08/17 1508  1,000 mL at 03/08/17 1508  . fentaNYL (SUBLIMAZE) injection 25 mcg  25 mcg Intravenous Q2H PRN Wilhelmina Mcardle, MD      . haloperidol lactate (HALDOL) injection 2.5 mg  2.5 mg Intravenous Q6H PRN Henreitta Leber, MD      . heparin injection 5,000 Units  5,000 Units Subcutaneous Q8H Laverle Hobby, MD   5,000 Units at 03/08/17 1456  . insulin aspart (novoLOG) injection 0-9 Units  0-9 Units Subcutaneous Q4H Laverle Hobby, MD   3 Units at 03/08/17 1159  . metoCLOPramide (REGLAN) injection 10 mg  10 mg Intravenous Q8H PRN Demetrios Loll, MD   10 mg at 03/08/17 1140  . OLANZapine zydis (ZYPREXA) disintegrating tablet 15 mg  15 mg Oral QHS Caprina Wussow, Madie Reno, MD   15 mg at 03/07/17 2342  . ondansetron (ZOFRAN) injection 4 mg  4 mg Intravenous Q6H PRN Hugelmeyer, Alexis, DO      . oxyCODONE-acetaminophen (PERCOCET/ROXICET) 5-325 MG per tablet 1 tablet  1 tablet Per Tube Q6H PRN Demetrios Loll, MD   1 tablet at 03/08/17 1140  .  piperacillin-tazobactam (ZOSYN) IVPB 3.375 g  3.375 g Intravenous Q8H Demetrios Loll, MD      . senna-docusate (Senokot-S) tablet 1 tablet  1 tablet Oral QHS PRN Hugelmeyer, Alexis, DO      . sodium chloride flush (NS) 0.9 % injection 10-40 mL  10-40 mL Intracatheter Q12H Varughese, Bincy S, NP   10 mL at 03/07/17 0853  . sodium chloride flush (NS) 0.9 % injection 10-40 mL  10-40 mL Intracatheter PRN Varughese, Bincy S, NP      . sodium chloride flush (NS) 0.9 % injection 3 mL  3 mL Intravenous Q12H Hugelmeyer, Alexis, DO   3 mL at 03/08/17 1000  . thiamine (B-1) injection 100 mg  100 mg Intravenous Daily Hallaji, Sheema M, RPH   100 mg at 03/08/17 1017  . valproate (DEPACON) 250 mg in dextrose 5 % 50 mL IVPB  250 mg Intravenous TID Henreitta Leber, MD   Stopped at 03/08/17 1117    Musculoskeletal: Strength & Muscle Tone: decreased Gait & Station: unable to stand Patient leans: N/A  Psychiatric Specialty Exam: Physical Exam  Nursing note and vitals reviewed. Constitutional: He appears well-developed and well-nourished. He appears distressed.  HENT:  Head: Normocephalic and atraumatic.  Eyes: Pupils are equal, round, and reactive to light. Conjunctivae are normal.  Neck: Normal range of motion.  Cardiovascular: Normal heart sounds.   Respiratory: Effort normal.  GI: Soft.  Musculoskeletal: Normal range of motion.  Neurological: He is alert.  Skin: Skin is warm and dry.  Psychiatric: His affect is labile. His speech is delayed and tangential. He is agitated. Cognition and memory are impaired. He expresses impulsivity. He expresses no homicidal and no suicidal ideation. He is noncommunicative.    Review of Systems  Unable to perform ROS: Mental status change    Blood pressure 126/67, pulse 76, temperature 98 F (36.7 C), temperature source Oral, resp. rate (!) 22, height _0  (1.727 m), weight 229 lb (103.9 kg), SpO2 93 %.Body mass index is 34.82 kg/m.  General Appearance: Disheveled   Eye Contact:  Minimal  Speech:  Garbled  Volume:  Decreased  Mood:  Dysphoric  Affect:  Congruent  Thought Process:  Disorganized  Orientation:  Negative  Thought Content:  Illogical and Rumination  Suicidal Thoughts:  No  Homicidal Thoughts:  No  Memory:  Negative  Judgement:  Negative  Insight:  Negative  Psychomotor Activity:  Restlessness  Concentration:  Concentration: Poor  Recall:  Poor  Fund of Knowledge:  Poor  Language:  Fair  Akathisia:  No  Handed:  Right  AIMS (if indicated):     Assets:  Financial Resources/Insurance Housing Social Support  ADL's:  Impaired  Cognition:  Impaired,  Moderate  Sleep:        Treatment Plan Summary: Daily contact with patient to assess and evaluate symptoms and progress in treatment, Medication management and Plan Possible slight improvement in delirium although still confused and unable to communicate. Continue current medication including the dissolvable Zyprexa at night. Follow-up as needed.  Disposition: Patient does not meet criteria for psychiatric inpatient admission. Supportive therapy provided about ongoing stressors.  Alethia Berthold, MD 03/08/2017 6:00 PM

## 2017-03-08 NOTE — Progress Notes (Signed)
Pt pulled dobhoff NG tube out. Dobhoff NG tube replaced, awaiting verification of placement via x-ray. Pt tolerated well, mitts reapplied. Otilio JeffersonMadelyn S Fenton, RN

## 2017-03-08 NOTE — Progress Notes (Signed)
Okay to leave dobhoff out per Dr. Cherlynn KaiserSainani. Will get speech eval first thing in the morning per [name] with speech. Alvin JeffersonMadelyn S Fenton, RN

## 2017-03-08 NOTE — Progress Notes (Signed)
03/08/2017  Subjective: Patient is 7 Days Post-Op s/p exploratory laparotomy.  Patient pulled dobhoff tube yesterday but was replaced and tube feeds continued.  Also had pulled central line and TPN was stopped.  Has had some mild abdominal distention, but he had a very large bowel movement today.  Vital signs: Temp:  [98.4 F (36.9 C)-100.3 F (37.9 C)] 98.4 F (36.9 C) (08/01 0408) Pulse Rate:  [81-85] 81 (08/01 0403) Resp:  [17-22] 20 (08/01 0403) BP: (135-158)/(69-86) 146/69 (08/01 0403) SpO2:  [93 %-95 %] 93 % (08/01 1332) Weight:  [103.9 kg (229 lb)] 103.9 kg (229 lb) (08/01 0500)   Intake/Output: 07/31 0701 - 08/01 0700 In: 1282 [I.V.:150; NG/GT:932; IV Piggyback:200] Out: 2550 [Urine:2550] Last BM Date: 03/08/17  Physical Exam: Constitutional: No acute distress, delirious, but more awake Abdomen: Soft, mildly distended, nontender to palpation. Midline incision is clean dry and intact with staples in place. Dobbhoff tube in place with tube feeds running.  Labs:   Recent Labs  03/07/17 0449 03/08/17 0624  WBC 26.4* 25.8*  HGB 12.1* 12.8*  HCT 35.8* 36.7*  PLT 148* 198    Recent Labs  03/07/17 0449 03/08/17 0624  NA 151* 146*  K 3.1* 3.6  CL 122* 117*  CO2 25 23  GLUCOSE 215* 207*  BUN 38* 31*  CREATININE 1.26* 1.05  CALCIUM 7.9* 7.8*   No results for input(s): LABPROT, INR in the last 72 hours.  Imaging: Dg Abd 1 View  Result Date: 03/08/2017 CLINICAL DATA:  Abdominal pain EXAM: ABDOMEN - 1 VIEW COMPARISON:  March 07, 2017 FINDINGS: Feeding tube tip is in stomach. There is postoperative change with surgical clips just to the right of midline in the lower abdomen or pelvis. There are loops of borderline dilated small bowel. No air-fluid level. No free air evident. IMPRESSION: Suspect a degree of postoperative ileus. Obstruction not felt to be likely. No free air. Feeding tube tip in stomach. Electronically Signed   By: Bretta BangWilliam  Woodruff III M.D.   On:  03/08/2017 14:11   Dg Abd 1 View  Result Date: 03/07/2017 CLINICAL DATA:  Feeding tube dysfunction.  Patient pulled on tube EXAM: ABDOMEN - 1 VIEW COMPARISON:  03/06/2017 FINDINGS: Feeding tube coiled in the body the stomach similar position to the prior study. No dilated bowel loops. Left lower lobe consolidation. IMPRESSION: Feeding tube remains coiled in the body the stomach. Electronically Signed   By: Marlan Palauharles  Clark M.D.   On: 03/07/2017 18:58    Assessment/Plan: 76 year old male status post exploratory laparotomy.  -Abdomen is mildly distended and KUB shows possibly a postoperative ileus. However the patient had a large bowel movement this morning. At this point would recommend continuing with Dobbhoff tube feeds as tolerated and advance as tolerated. --we'll continue following along with you.   Howie IllJose Luis Alexys Lobello, MD [facility]

## 2017-03-08 NOTE — Progress Notes (Signed)
Chaplain was making rounds and visited with pt in room 118. Chaplain provided the ministry of prayer and a spiritual presence.    03/08/17 0915  Clinical Encounter Type  Visited With Patient  Visit Type Initial;Spiritual support  Referral From Nurse  Consult/Referral To Chaplain  Spiritual Encounters  Spiritual Needs Prayer

## 2017-03-08 NOTE — Progress Notes (Signed)
Nutrition Follow-up  DOCUMENTATION CODES:   Obesity unspecified  INTERVENTION:  1. Continue Vital 1.5 at 3825mL/hr advance every 12 hrs to goal rate 3155mL/hr  2. Recommend reglan to help with abdominal distension, gastric motility. Now ordered.  3. Continue Pro-stat 30mL TID  4. Continue 150mL free water 8 times daily while patient is off IV fluids. At goal regimen provides 2280 calories, 134gm protein, 1008mL free water With free water, provides 2208mL free water  5. Continue thiamine 100mg   NUTRITION DIAGNOSIS:   Inadequate oral intake related to inability to eat as evidenced by NPO status. -ongoing  GOAL:   Patient will meet greater than or equal to 90% of their needs -not meeting currently  MONITOR:   TF tolerance, I & O's, Labs, Weight trends, Skin  ASSESSMENT:   76 year old male with PMHx of HTN, bipolar disorder who presented with N/V for 3 days found to have ischemic small bowel with small bowel pneumatosis in left lower abdomen. Sent to ICU with SOB secondary to PNA requiring intubation. Pt s/p exploratory laparotomy without SBR on 7/25.  Paged MD Imogene Burnhen about providing patient with Reglan, discussed with RN, patient has some abdominal distension, but also had a BM this morning. Abdomen appeared soft upon exam. Seemed more alert during visit but he is still unable to follow commands. NGT tip coiled in stomach per Xray Weight trending downwards, still up 13lbs from admission.  Intake/Output Summary (Last 24 hours) at 03/08/17 1124 Last data filed at 03/08/17 1009  Gross per 24 hour  Intake             1282 ml  Output             2500 ml  Net            -1218 ml  15L Fluid Positive  Labs reviewed:  CBGs 166, 192, 147 Na 146  Medications reviewed and include:  Novolog 0-9 Units every 4 hours D5 K+ 20mEq at 7375mL/hr --> 306 calories Reglan (just ordered by MD Imogene Burnhen)  Diet Order:     Skin:  Wound (see comment) (closed incision abdomen)  Last BM:  03/08/2017  (Type 6)  Height:   Ht Readings from Last 1 Encounters:  03/01/17 5\' 8"  (1.727 m)    Weight:   Wt Readings from Last 1 Encounters:  03/08/17 229 lb (103.9 kg)  Weight of 216lb (98.4 kg) used for calculations  Ideal Body Weight:  70 kg  BMI:  Body mass index is 34.82 kg/m.  Estimated Nutritional Needs:   Kcal:  [id number] calories (25-30cal/kg ABW)  Protein:  118-147 grams (1.2-1.5g/kg)  Fluid:  1.8 L/day (25 ml/kg IBW)  EDUCATION NEEDS:   No education needs identified at this time  Dionne AnoWilliam M. Nadege Carriger, MS, RD LDN Inpatient Clinical Dietitian Pager [phone]

## 2017-03-08 NOTE — Progress Notes (Addendum)
[facility] - [place] [facility]   PATIENT NAME: Alvin PeckJohn [name]    MR#:  [id number]  DATE OF BIRTH:  08/10/1940  SUBJECTIVE:   Still confused, started tube feeding, off TPN. abd xray: illeus.  REVIEW OF SYSTEMS:    Review of Systems  Unable to perform ROS: Mental acuity    Nutrition: Tube feeds Tolerating Diet: Yes Tolerating PT: Await Eval.  DRUG ALLERGIES:  No Known Allergies  VITALS:  Blood pressure 126/67, pulse 76, temperature 98 F (36.7 C), temperature source Oral, resp. rate (!) 22, height 5\' 8"  (1.727 m), weight 229 lb (103.9 kg), SpO2 93 %.  PHYSICAL EXAMINATION:   Physical Exam  GENERAL:  76 y.o.-year-old patient lying in bed in NAD. EYES: Right Pupil is fixed and dilated and not responsive to light. No scleral icterus.  HEENT: Head atraumatic, normocephalic. NG tube in place.    NECK:  Supple, no jugular venous distention. No thyroid enlargement, no tenderness.  LUNGS: right lung wheezing and rhonchi. No use of accessory muscles of respiration.  CARDIOVASCULAR: S1, S2 normal. No murmurs, rubs, or gallops.  ABDOMEN: Soft, nontender, nondistended. Bowel sounds Hypoactive but present. No organomegaly or mass. + mid-abdomen staples in place with no acute drainage.  EXTREMITIES: No cyanosis, clubbing, but Bilateral leg edema 2+.    NEUROLOGIC: Globally weak. PSYCHIATRIC: confused, not follow commands. SKIN: No obvious rash, lesion, or ulcer.    LABORATORY PANEL:   CBC  Recent Labs Lab 03/08/17 0624  WBC 25.8*  HGB 12.8*  HCT 36.7*  PLT 198   ------------------------------------------------------------------------------------------------------------------  Chemistries   Recent Labs Lab 03/06/17 0421 03/07/17 0449 03/08/17 0624  NA 149* 151* 146*  K 3.7 3.1* 3.6  CL 122* 122* 117*  CO2 23 25 23   GLUCOSE 224* 215* 207*  BUN 42* 38* 31*  CREATININE 1.39* 1.26* 1.05  CALCIUM 7.7* 7.9* 7.8*  MG 2.2 2.3  --   AST 47*  --   --    ALT 30  --   --   ALKPHOS 72  --   --   BILITOT 0.6  --   --    ------------------------------------------------------------------------------------------------------------------  Cardiac Enzymes No results for input(s): TROPONINI in the last 168 hours. ------------------------------------------------------------------------------------------------------------------  RADIOLOGY:  Dg Abd 1 View  Result Date: 03/08/2017 CLINICAL DATA:  Abdominal pain EXAM: ABDOMEN - 1 VIEW COMPARISON:  March 07, 2017 FINDINGS: Feeding tube tip is in stomach. There is postoperative change with surgical clips just to the right of midline in the lower abdomen or pelvis. There are loops of borderline dilated small bowel. No air-fluid level. No free air evident. IMPRESSION: Suspect a degree of postoperative ileus. Obstruction not felt to be likely. No free air. Feeding tube tip in stomach. Electronically Signed   By: Bretta BangWilliam  Woodruff III M.D.   On: 03/08/2017 14:11   Dg Abd 1 View  Result Date: 03/07/2017 CLINICAL DATA:  Feeding tube dysfunction.  Patient pulled on tube EXAM: ABDOMEN - 1 VIEW COMPARISON:  03/06/2017 FINDINGS: Feeding tube coiled in the body the stomach similar position to the prior study. No dilated bowel loops. Left lower lobe consolidation. IMPRESSION: Feeding tube remains coiled in the body the stomach. Electronically Signed   By: Marlan Palauharles  Clark M.D.   On: 03/07/2017 18:58   Dg Abd 1 View  Result Date: 03/06/2017 CLINICAL DATA:  Status post nasogastric feeding tube placement. EXAM: ABDOMEN - 1 VIEW COMPARISON:  03/04/2017.  Portable chest obtained earlier today. FINDINGS: The nasogastric  tube has been replaced with a feeding tube with its tip in the mid stomach. The included bowel gas pattern is unremarkable. Dense left lower lobe airspace opacity without significant change. Small left pleural effusion. IMPRESSION: 1. Feeding tube tip in the mid stomach. 2. Stable dense left lower lobe atelectasis  or pneumonia. 3. Small left pleural effusion. Electronically Signed   By: Beckie SaltsSteven  Reid M.D.   On: 03/06/2017 18:33     ASSESSMENT AND PLAN:   76 year old male with past medical history of hypertension, bipolar disorder, who presented to the hospital due to abdominal pain, nausea or diarrhea and suspected to have ischemic bowel and status post exploratory laparotomy which was negative for ischemic bowel patient now is severe sepsis with septic shock secondary to pneumonia.  1. Severe sepsis with septic shock- secondary to pneumonia and suspected Aspiration Pneumonia.  - Improved and off vasopressors. Still leukocytosis, procalcitonin is 1.07. Continue Zosyn, follow up CBC.  2. Acute respiratory failure-secondary to pneumonia/ARDS. -Extubated and on room air with O2 sats in the low 90s and clinically stable. Continue aggressive pulmonary toileting, continue antibiotics. Likely will need a speech eval and mental status improves.  3. Abdominal pain/distention-patient is status post exploratory laparotomy showing no evidence of ischemic bowel. Per general surgeon,  advanced tube feedings and discontinued TPN. Abdominal x-ray show ileus. But the patient had bowel movement today.  4. Hypernatremia, improving with D5W. Discontinue D5W due to wheezing and leg edema.  Bilateral leg edema. Possible due to hypoalbuminemia.  I discontinued IV fluids, give Lasix 20 mg IV 1 dose, follow-up echocardiogram.  5. Hypokalemia. Improved with potassium supplement.  6. Elevated troponin-secondary to supply demand ischemia, No evidence of acute coronary syndrome.  7. Leukocytosis-secondary to sepsis  Cont. IV Zosyn and a follow-up CBC.  8. Hx of Bipolar Disorder - cont. Depakote.   9. Hyperlipidemia - cont. Atorvastatin  10. Hx of Gout - cont. Allopurinol.   11. Delirium.  restarted his 15 mg of Zyprexa at night per Psych consult.   I discussed with Dr. Aleen CampiPiscoya.  All the records are reviewed and  case discussed with Care Management/Social Worker. Management plans discussed with the patient, family and they are in agreement.  CODE STATUS: DNR  DVT Prophylaxis: Hep SQ  TOTAL TIME TAKING CARE OF THIS PATIENT: 36 minutes.   POSSIBLE D/C unclear, DEPENDING ON CLINICAL CONDITION.   Shaune Pollackhen, Adalai Perl M.D on 03/08/2017 at 4:05 PM  Between 7am to 6pm - Pager - [phone]  After 6pm go to [email or web address] - Social research officer, governmentpassword [email or web address]  [facility] [place] [facility]  Office  [phone]  CC: Primary care physician; System, Pcp Not In

## 2017-03-09 ENCOUNTER — Encounter: Payer: Self-pay | Admitting: Nurse Practitioner

## 2017-03-09 DIAGNOSIS — I420 Dilated cardiomyopathy: Secondary | ICD-10-CM

## 2017-03-09 DIAGNOSIS — R748 Abnormal levels of other serum enzymes: Secondary | ICD-10-CM

## 2017-03-09 LAB — BASIC METABOLIC PANEL
Anion gap: 6 (ref 5–15)
BUN: 26 mg/dL — AB (ref 6–20)
CHLORIDE: 115 mmol/L — AB (ref 101–111)
CO2: 24 mmol/L (ref 22–32)
CREATININE: 0.94 mg/dL (ref 0.61–1.24)
Calcium: 7.6 mg/dL — ABNORMAL LOW (ref 8.9–10.3)
GFR calc Af Amer: >60 mL/min (ref >60)
GFR calc non Af Amer: >60 mL/min (ref >60)
GLUCOSE: 121 mg/dL — AB (ref 65–99)
POTASSIUM: 3.4 mmol/L — AB (ref 3.5–5.1)
Sodium: 145 mmol/L (ref 135–145)

## 2017-03-09 LAB — GLUCOSE, CAPILLARY
GLUCOSE-CAPILLARY: 158 mg/dL — AB (ref 65–99)
GLUCOSE-CAPILLARY: 135 mg/dL — AB (ref 65–99)
Glucose-Capillary: 109 mg/dL — ABNORMAL HIGH (ref 65–99)
Glucose-Capillary: 121 mg/dL — ABNORMAL HIGH (ref 65–99)
Glucose-Capillary: 137 mg/dL — ABNORMAL HIGH (ref 65–99)
Glucose-Capillary: 144 mg/dL — ABNORMAL HIGH (ref 65–99)

## 2017-03-09 LAB — CBC
HEMATOCRIT: 37.1 % — AB (ref 40.0–52.0)
HEMOGLOBIN: 12.7 g/dL — AB (ref 13.0–18.0)
MCH: 33.9 pg (ref 26.0–34.0)
MCHC: 34.3 g/dL (ref 32.0–36.0)
MCV: 98.9 fL (ref 80.0–100.0)
PLATELETS: 196 10*3/uL (ref 150–440)
RBC: 3.75 MIL/uL — AB (ref 4.40–5.90)
RDW: 14.1 % (ref 11.5–14.5)
WBC: 21 10*3/uL — AB (ref 3.8–10.6)

## 2017-03-09 LAB — ECHOCARDIOGRAM COMPLETE
HEIGHTINCHES: 68 in
Weight: 3664 oz

## 2017-03-09 MED ORDER — METOPROLOL TARTRATE 25 MG PO TABS
12.5000 mg | ORAL_TABLET | Freq: Two times a day (BID) | ORAL | Status: DC
  Administered 2017-03-09: 22:00:00 12.5 mg via ORAL
  Filled 2017-03-09 (×2): qty 1

## 2017-03-09 MED ORDER — POTASSIUM CHLORIDE 20 MEQ/15ML (10%) PO SOLN: 40.0000 meq | Freq: Once | ORAL | Status: DC

## 2017-03-09 MED ORDER — INSULIN ASPART 100 UNIT/ML ~~LOC~~ SOLN
0.0000 [IU] | Freq: Three times a day (TID) | SUBCUTANEOUS | Status: DC
  Administered 2017-03-09 – 2017-03-10 (×3): 2 [IU] via SUBCUTANEOUS
  Administered 2017-03-10: 3 [IU] via SUBCUTANEOUS
  Administered 2017-03-11 (×2): 1 [IU] via SUBCUTANEOUS
  Administered 2017-03-11: 3 [IU] via SUBCUTANEOUS
  Administered 2017-03-12: 1 [IU] via SUBCUTANEOUS
  Filled 2017-03-09 (×8): qty 1

## 2017-03-09 MED ORDER — FUROSEMIDE 10 MG/ML IJ SOLN
20.0000 mg | Freq: Two times a day (BID) | INTRAMUSCULAR | Status: DC
  Administered 2017-03-09 – 2017-03-10 (×2): 20 mg via INTRAVENOUS
  Filled 2017-03-09 (×2): qty 2

## 2017-03-09 MED ORDER — DIVALPROEX SODIUM ER 500 MG PO TB24
750.0000 mg | ORAL_TABLET | Freq: Every day | ORAL | Status: DC
  Filled 2017-03-09 (×2): qty 1

## 2017-03-09 MED ORDER — POTASSIUM CHLORIDE 20 MEQ/15ML (10%) PO SOLN
40.0000 meq | Freq: Once | ORAL | Status: AC
  Administered 2017-03-09: 40 meq via ORAL
  Filled 2017-03-09: qty 30

## 2017-03-09 MED ORDER — INSULIN ASPART 100 UNIT/ML ~~LOC~~ SOLN: 0.0000 [IU] | Freq: Every day | SUBCUTANEOUS | Status: DC

## 2017-03-09 MED ORDER — PIPERACILLIN-TAZOBACTAM 3.375 G IVPB: 3.3750 g | Freq: Three times a day (TID) | INTRAVENOUS | Status: DC

## 2017-03-09 MED ORDER — LISINOPRIL 5 MG PO TABS
2.5000 mg | ORAL_TABLET | Freq: Every day | ORAL | Status: DC
  Administered 2017-03-09 – 2017-03-17 (×8): 2.5 mg via ORAL
  Filled 2017-03-09 (×8): qty 1

## 2017-03-09 NOTE — Evaluation (Signed)
Physical Therapy Evaluation Patient Details Name: Alvin PeckJohn [name] MRN: [id number] DOB: 10/03/1940 Today's Date: 03/09/2017   History of Present Illness  presented to ER seconadry to SOB, respiratory distress; admitted wtih severe sepsis secondary to CAP, small bowel pneumonitis.  Intubated 7/25-7/29; status post emergent exploratory laparotomy 7/25.  Hospital course complicated by need for pressors, intermittent agitation/confusion and post-op ileus.  Clinical Impression  Upon evaluation, patient oriented to self only; follows simple commands (performance optimized with demonstration from therapist) and demonstrates good effort with evaluation components.  Patient generally weak and deconditioned throughout all extremities with global edema noted throughout.  Currently requiring min assist for rolling bilat (heavy use of UEs to initiate/complete rotation); significant fatigue noted afterwards.  Unable to tolerate additional mobility at this time (closing eyes, falling asleep once repositioned in bed); will continue to progress as able with goal of progressing to edge of bed (?OOB to chair?) next session. Would benefit from skilled PT to address above deficits and promote optimal return to PLOF; recommend transition to STR upon discharge from acute hospitalization.     Follow Up Recommendations SNF    Equipment Recommendations       Recommendations for Other Services       Precautions / Restrictions Precautions Precautions: Fall Precaution Comments: Dysphagia I/nectar thick liquids Restrictions Weight Bearing Restrictions: No      Mobility  Bed Mobility Overal bed mobility: Needs Assistance Bed Mobility: Rolling Rolling: Min assist         General bed mobility comments: heavy use of bedrails to assist with rotation  Transfers                 General transfer comment: deferred secondary to fatigue/limited functional endurance  Ambulation/Gait             General Gait  Details: deferred secondary to fatigue/limited functional endurance  Stairs            Wheelchair Mobility    Modified Rankin (Stroke Patients Only)       Balance                                             Pertinent Vitals/Pain Pain Assessment: Faces Faces Pain Scale: Hurts little more Pain Location: abdomen, generalized soreness Pain Descriptors / Indicators: Grimacing Pain Intervention(s): Limited activity within patient's tolerance;Monitored during session;Repositioned    Home Living Family/patient expects to be discharged to:: Assisted living                 Additional Comments: Patient unable to provide accurate history.  Per chart, was ambulatory to some degree at ALF.    Prior Function           Comments: Patient unable to provide accurate history.  Per chart, was ambulatory to some degree at ALF.     Hand Dominance        Extremity/Trunk Assessment   Upper Extremity Assessment Upper Extremity Assessment: Generalized weakness (grossly 4-/5 throughout; generally edematous throughout)    Lower Extremity Assessment Lower Extremity Assessment: Generalized weakness (grossly 3/5 throughout)       Communication   Communication:  (speech garbled, non-sensical at times; other times, clear and logical)  Cognition Arousal/Alertness: Lethargic Behavior During Therapy: WFL for tasks assessed/performed Overall Cognitive Status: Difficult to assess  General Comments: follows simple commands (performance enhanced with demonstration from therapist); generally slow to respond with increased time required for processing/task initiation      General Comments      Exercises Other Exercises Other Exercises: Rolling bilat, min assist with heavy use of bedrails; dep +2 for management of incontinent bowels, linen and clothing change Other Exercises: Supine LE therex, 1x8, act assistm ROM for  muscular strength/endurance with functional activities   Assessment/Plan    PT Assessment Patient needs continued PT services  PT Problem List Decreased strength;Decreased range of motion;Decreased activity tolerance;Decreased balance;Decreased mobility;Decreased coordination;Decreased cognition;Decreased knowledge of use of DME;Decreased safety awareness;Decreased knowledge of precautions       PT Treatment Interventions DME instruction;Gait training;Stair training;Functional mobility training;Therapeutic activities;Therapeutic exercise;Balance training;Patient/family education    PT Goals (Current goals can be found in the Care Plan section)  Acute Rehab PT Goals PT Goal Formulation: Patient unable to participate in goal setting Time For Goal Achievement: 03/23/17 Potential to Achieve Goals: Fair Additional Goals Additional Goal #1: Assess and establish goals for OOB/gait as appropriate.    Frequency Min 2X/week   Barriers to discharge Decreased caregiver support      Co-evaluation               AM-PAC PT "6 Clicks" Daily Activity  Outcome Measure Difficulty turning over in bed (including adjusting bedclothes, sheets and blankets)?: Total Difficulty moving from lying on back to sitting on the side of the bed? : Total Difficulty sitting down on and standing up from a chair with arms (e.g., wheelchair, bedside commode, etc,.)?: Total Help needed moving to and from a bed to chair (including a wheelchair)?: Total Help needed walking in hospital room?: Total Help needed climbing 3-5 steps with a railing? : Total 6 Click Score: 6    End of Session   Activity Tolerance: Patient limited by fatigue Patient left: in bed;with call bell/phone within reach;with bed alarm set Nurse Communication: Mobility status PT Visit Diagnosis: Muscle weakness (generalized) (M62.81);Difficulty in walking, not elsewhere classified (R26.2)    Time: [id number] PT Time Calculation (min) (ACUTE  ONLY): 24 min   Charges:   PT Evaluation $PT Eval Moderate Complexity: 1 Mod PT Treatments $Therapeutic Activity: 8-22 mins   PT G Codes:   PT G-Codes **NOT FOR INPATIENT CLASS** Functional Assessment Tool Used: AM-PAC 6 Clicks Basic Mobility Functional Limitation: Mobility: Walking and moving around Mobility: Walking and Moving Around Current Status ([id number](G8978): At least 60 percent but less than 80 percent impaired, limited or restricted Mobility: Walking and Moving Around Goal Status [id number](G8979): At least 1 percent but less than 20 percent impaired, limited or restricted    Geovanna Simko H. Manson PasseyBrown, PT, DPT, NCS 03/09/17, 5:03 PM [phone]

## 2017-03-09 NOTE — Consult Note (Signed)
Cardiology Consult    Patient ID: Alvin [name] MRN: [id number], DOB/AGE: [id number]   Admit date: 02/28/2017 Date of Consult: 03/09/2017  Primary Physician: System, Pcp Not In Primary Cardiologist: new  Requesting Provider: Q. Imogene Burn, MD  Patient Profile    Alvin [name] is a 76 y.o. male with a history of PPM placement, bipolar d/o, HL, gout, bph, sepsis, resp failure, and ischemic bowel s/p exploratory lap this admission who is being seen today for the evaluation of elevated troponin and LV dysfxn at the request of Dr. Imogene Burn.  Past Medical History   Past Medical History:  Diagnosis Date  . Bipolar disorder (HCC)   . BPH (benign prostatic hyperplasia)   . Cardiomyopathy (HCC)    a. 03/2017 Echo: EF 40%, mild diastolic dysfxn. Mild MR.  . Gout   . 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  . Hyperlipidemia   . Hypertension     Past Surgical History:  Procedure Laterality Date  . LAPAROTOMY N/A 03/01/2017   Procedure: EXPLORATORY LAPAROTOMY;  Surgeon: Ancil Linsey, MD;  Location: [facility] ORS;  Service: General;  Laterality: N/A;     Allergies  No Known Allergies  History of Present Illness    76 y/o ? with the above complex PMH including prior PPM placement in [place] (? Year - pt unable to provide specifics), HL, bipolar d/o, gout, bph, and HL.  He lives locally.  He currently says that he lives with his sister but noted indicate that he was staying @ an ALF.  He is currently very groggy and unable to provide much information - dozing off frequently.  He is not sure what led to his hospitalization.  Per notes, he presented to the ED on 7/25 with a 3-4 day history of abdominal, mid-epigastric, and RLQ pain with n, v, and diarrhea.  This was followed by poor PO intake, weakness, confusion, and dyspnea - thus prompting presentation.  He was found to be septic and hypoxic, requiring intubation, vasopressors, and broad spectrum Abx.  CT of the abd suggested ischemic  small bowel with small bowel pneumatosis in the LLQ and mesenteric gas.  Acute pancreatitis was also noted.  Surgery was consulted and he underwent ex laparotomy.  Per note, "All of patient's small intestine initially appeared purple with fibrinous exudative film adherent to LLQ bowel in particular, but all of the intestine became pink upon evisceration, taking care not to twist the mesentery."  Post-operatively, he remained intubated on vasopressors.  He was not able to be extubated until 7/29.  Following extubation, he had some delirium and was eval by psychiatry.  He has also been edematous with hypoalbuminemia (albumin 1.6).  As a result, echo was performed 8/1 and showed an EF of 40% with mild diastolic dysfxn.  Also, in the setting of above, he had mild troponin elevation to a peak of 0.09.  As a result, we have been asked to eval.  As noted above, he is not really able to provide much meaningful history.  He says that he thinks he was told he had a heart attack many years ago.  He isn't sure if he had ever had a regular cardiologist but doesn't think he has one now.  Inpatient Medications    . albuterol  2.5 mg Nebulization Q6H  . feeding supplement (PRO-STAT SUGAR FREE 64)  30 mL Per Tube TID  . furosemide  20 mg Intravenous Q12H  . heparin subcutaneous  5,000 Units  Subcutaneous Q8H  . insulin aspart  0-5 Units Subcutaneous QHS  . insulin aspart  0-9 Units Subcutaneous TID WC  . lisinopril  2.5 mg Oral Daily  . OLANZapine zydis  15 mg Oral QHS  . sodium chloride flush  3 mL Intravenous Q12H  . thiamine injection  100 mg Intravenous Daily    Family History    Parents are deceased - pt unable to provide history beyond that.  Social History    Social History   Social History  . Marital status: Divorced    Spouse name: N/A  . Number of children: N/A  . Years of education: N/A   Occupational History  . Not on file.   Social History Main Topics  . Smoking status: Never Smoker  .  Smokeless tobacco: Never Used  . Alcohol use No  . Drug use: No  . Sexual activity: Not on file   Other Topics Concern  . Not on file   Social History Narrative   Pt says that he lives in [place] with his sister, though notes indicate that he lives in an assisted living facitliy.  He does not routinely exercise.     Review of Systems    General:  No chills, fever, night sweats or weight changes.  Cardiovascular:  No chest pain, +++ dyspnea on exertion, +++ edema, noorthopnea, palpitations, paroxysmal nocturnal dyspnea. Dermatological: No rash, lesions/masses Respiratory: +++ cough, +++ dyspnea Urologic: No hematuria, dysuria Abdominal:   +++ nausea, vomiting, diarrhea, abd pain prior to admission.  No bright red blood per rectum, melena, or hematemesis Neurologic:  No visual changes, wkns, +++ delirium following extubation w/ ongoing confusion and somnolence. All other systems reviewed and are otherwise negative except as noted above.  Physical Exam    Blood pressure 140/84, pulse 71, temperature 98.4 F (36.9 C), temperature source Oral, resp. rate (!) 22, height 5\' 8"  (1.727 m), weight 238 lb (108 kg), SpO2 92 %.  General: Pleasant, NAD.  Very groggy - dozes frequently. Psych: flat affect. Neuro: oriented to self. Moves all extremities spontaneously. HEENT: Normal  Neck: Supple without bruits or JVD. Lungs:  Resp regular and unlabored, scattered rhonchi. Heart: RRR no s3, s4, or murmurs. Abdomen: Soft, non-tender, non-distended, BS + x 4.  Extremities: No clubbing, cyanosis.  3+ bilat LE edema. DP/PT/Radials 2+ and equal bilaterally.  Labs    Lab Results  Component Value Date   TROPONINI 0.09 (HH) 03/01/2017    Lab Results  Component Value Date   WBC 21.0 (H) 03/09/2017   HGB 12.7 (L) 03/09/2017   HCT 37.1 (L) 03/09/2017   MCV 98.9 03/09/2017   PLT 196 03/09/2017    Recent Labs Lab 03/06/17 0421  03/09/17 0433  NA 149*  < > 145  K 3.7  < > 3.4*  CL 122*   < > 115*  CO2 23  < > 24  BUN 42*  < > 26*  CREATININE 1.39*  < > 0.94  CALCIUM 7.7*  < > 7.6*  PROT 4.4*  --   --   BILITOT 0.6  --   --   ALKPHOS 72  --   --   ALT 30  --   --   AST 47*  --   --   GLUCOSE 224*  < > 121*  < > = values in this interval not displayed. Lab Results  Component Value Date   TRIG 163 (H) 03/06/2017     Radiology Studies  Ct Abdomen Pelvis Wo Contrast  Result Date: 03/01/2017 CLINICAL DATA:  Abdominal pain and distension.  Nausea and vomiting. EXAM: CT ABDOMEN AND PELVIS WITHOUT CONTRAST TECHNIQUE: Multidetector CT imaging of the abdomen and pelvis was performed following the standard protocol without IV contrast. COMPARISON:  None. FINDINGS: Lower chest: Pacemaker partially included. Small left pleural effusion with adjacent basilar airspace disease, atelectasis versus pneumonia. Small right pleural effusion with adjacent atelectasis. Mild cardiomegaly. Hepatobiliary: Question of nodular hepatic contours. No evidence of focal hepatic lesion on noncontrast exam. Multiple calcified gallstones including possible stone in the gallbladder neck. No biliary dilatation. Pancreas: There is soft tissue stranding about the entire pancreas consistent with pancreatitis. Small amount peripancreatic fluid. No loculated fluid collection. No ductal dilatation. Spleen: Normal in size without focal abnormality. Minimal perisplenic fluid tracking from the pancreatic process. Adrenals/Urinary Tract: Mild bilateral adrenal thickening without discrete nodule. No hydronephrosis. Mild nonspecific perinephric edema. No urolithiasis. Urinary bladder is physiologically distended. There is mild bladder wall thickening. Small left lateral bladder diverticulum. Stomach/Bowel: There is an enteric tube with tip in the distal esophagus. Stomach distended with fluid. There is a duodenum diverticulum. Dilated fluid-filled small bowel diffusely. Small bowel pneumatosis in the left mid abdomen, image 64  series 2. Degree of dilatation diminishes distally. No discrete transition point. Normal appendix. Mild gaseous distention of colon without colonic wall thickening. Diverticulosis of the sigmoid colon without acute inflammation. Vascular/Lymphatic: There is air in the mesenteric vessels, for example image 58 series 2. This is in the region of small bowel pneumatosis. Minimal air tracks into the proximal SMV. Aortic atherosclerosis without aneurysm. No definite adenopathy. Reproductive: Prominent prostate gland with central calcifications. Other: Small volume abdominopelvic ascites. No free air. No loculated abscess. Musculoskeletal: Degenerative change in the spine. There are no acute or suspicious osseous abnormalities. IMPRESSION: 1. Findings consistent with ischemic small bowel with small bowel pneumatosis in the left lower abdomen and mesenteric gas. No free air to suggest perforation. Diffusely dilated fluid-filled bowel without transition point suggesting ileus. 2. Acute pancreatitis. Peripancreatic inflammation and small amount peripancreatic fluid. 3. Tip of the enteric tube in the distal esophagus, recommend advancement. 4. Nodular hepatic contours suggesting cirrhosis. 5. Cholelithiasis. 6.  Aortic Atherosclerosis (ICD10-I70.0). Critical Value/emergent results were called by telephone at the time of interpretation on 03/01/2017 at 2:28 am to NP [facility] , who verbally acknowledged these results. Electronically Signed   By: Rubye Oaks M.D.   On: 03/01/2017 02:30   Dg Chest 1 View  Result Date: 03/05/2017 CLINICAL DATA:  Central line placement EXAM: CHEST 1 VIEW COMPARISON:  03/04/2017 FINDINGS: Interval removal of the right central line and placement of left central line. The tip is in the SVC. No pneumothorax. Otherwise, support devices are unchanged. Bilateral perihilar and lower lobe airspace opacities are again noted, not significantly changed. Small bilateral layering effusions. Heart is  borderline in size. IMPRESSION: Stable layering effusions and bilateral lower lobe opacities. Electronically Signed   By: Charlett Nose M.D.   On: 03/05/2017 07:28   Dg Chest 1 View  Result Date: 03/04/2017 CLINICAL DATA:  Dyspnea. EXAM: CHEST 1 VIEW COMPARISON:  03/03/2017 FINDINGS: Endotracheal tube terminates 3.3 cm above the carina. Right jugular catheter terminates over the lower SVC. 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 The cardiac silhouette is mildly enlarged. There is pulmonary vascular congestion with patchy perihilar and basilar opacity bilaterally, stable to slightly increased. Small bilateral pleural effusions are stable to slightly increased. No pneumothorax is identified. IMPRESSION: 1. Cardiomegaly and  pulmonary vascular congestion with stable to slightly increased bibasilar opacities which may reflect edema and atelectasis. 2. Persistent small pleural effusions. Electronically Signed   By: Sebastian AcheAllen  Grady M.D.   On: 03/04/2017 07:10   Dg Chest 1 View  Result Date: 03/03/2017 CLINICAL DATA:  Shortness of breath. EXAM: CHEST 1 VIEW COMPARISON:  03/01/2017. FINDINGS: Endotracheal tube, NG tube, right IJ line stable position. Cardiac pacer with lead tips in right atrium right ventricle. Cardiomegaly. Diffuse bilateral interstitial prominence with bilateral small pleural effusions. Low lung volumes with basilar atelectasis. No pneumothorax. IMPRESSION: 1. Lines and tubes in stable position. 2. Cardiac pacer stable position. Cardiomegaly with diffuse bilateral from interstitial prominence and small bilateral pleural effusions. Findings consistent with CHF. 3. Low lung volumes with basilar atelectasis. Electronically Signed   By: Maisie Fushomas  Register   On: 03/03/2017 06:46   Dg Abd 1 View  Result Date: 03/08/2017 CLINICAL DATA:  Feeding tube placement. EXAM: ABDOMEN - 1 VIEW COMPARISON:  03/08/2017.  03/08/2017 FINDINGS: Feeding tube noted with its tip projected over the stomach. No gastric  distention. Distended loops of small bowel again noted. Surgical staples noted over the abdomen. Cardiac pacer wire noted over the right ventricle. Small left pleural effusion cannot be excluded . IMPRESSION: 1. Feeding tube noted with its tip projected over the stomach. No gastric distention. Distended loops of small bowel again noted. 2. Small left pleural effusion . Electronically Signed   By: Maisie Fushomas  Register   On: 03/08/2017 17:07   Dg Abd 1 View  Result Date: 03/08/2017 CLINICAL DATA:  Feeding tube placed EXAM: ABDOMEN - 1 VIEW COMPARISON:  818 FINDINGS: feeding tube with tip in the gastric antrum versus first portion duodenum . IMPRESSION: Feeding tube with tip in the gastric antrum/first portion duodenum. Stylet remains. Electronically Signed   By: Genevive BiStewart  Edmunds M.D.   On: 03/08/2017 16:32   Dg Abd 1 View  Result Date: 03/08/2017 CLINICAL DATA:  Abdominal pain EXAM: ABDOMEN - 1 VIEW COMPARISON:  March 07, 2017 FINDINGS: Feeding tube tip is in stomach. There is postoperative change with surgical clips just to the right of midline in the lower abdomen or pelvis. There are loops of borderline dilated small bowel. No air-fluid level. No free air evident. IMPRESSION: Suspect a degree of postoperative ileus. Obstruction not felt to be likely. No free air. Feeding tube tip in stomach. Electronically Signed   By: Bretta BangWilliam  Woodruff III M.D.   On: 03/08/2017 14:11   Dg Abd 1 View  Result Date: 03/07/2017 CLINICAL DATA:  Feeding tube dysfunction.  Patient pulled on tube EXAM: ABDOMEN - 1 VIEW COMPARISON:  03/06/2017 FINDINGS: Feeding tube coiled in the body the stomach similar position to the prior study. No dilated bowel loops. Left lower lobe consolidation. IMPRESSION: Feeding tube remains coiled in the body the stomach. Electronically Signed   By: Marlan Palauharles  Clark M.D.   On: 03/07/2017 18:58   Dg Abd 1 View  Result Date: 03/06/2017 CLINICAL DATA:  Status post nasogastric feeding tube placement. EXAM:  ABDOMEN - 1 VIEW COMPARISON:  03/04/2017.  Portable chest obtained earlier today. FINDINGS: The nasogastric tube has been replaced with a feeding tube with its tip in the mid stomach. The included bowel gas pattern is unremarkable. Dense left lower lobe airspace opacity without significant change. Small left pleural effusion. IMPRESSION: 1. Feeding tube tip in the mid stomach. 2. Stable dense left lower lobe atelectasis or pneumonia. 3. Small left pleural effusion. Electronically Signed   By: Viviann SpareSteven  Azucena Kubaeid M.D.   On: 03/06/2017 18:33   Dg Abd 1 View  Result Date: 03/04/2017 CLINICAL DATA:  Abdominal distention EXAM: ABDOMEN - 1 VIEW COMPARISON:  03/01/2017 FINDINGS: NG tube tip is in the distal stomach. Nonobstructive bowel gas pattern. No free air or organomegaly. IMPRESSION: No acute findings.  NG tube tip in the distal stomach. Electronically Signed   By: Charlett NoseKevin  Dover M.D.   On: 03/04/2017 07:30   Dg Abd 1 View  Result Date: 03/01/2017 CLINICAL DATA:  OG tube placement. EXAM: ABDOMEN - 1 VIEW COMPARISON:  Abdominal CT earlier this day. FINDINGS: Tip and side port of the enteric tube is been advanced, now in the stomach. Gas just distention of stomach and small bowel. No evidence of free air. IMPRESSION: Tip and side port of the enteric tube in the stomach. Electronically Signed   By: Rubye OaksMelanie  Ehinger M.D.   On: 03/01/2017 03:52   Ct Head Wo Contrast  Result Date: 03/01/2017 CLINICAL DATA:  Altered mental status. EXAM: CT HEAD WITHOUT CONTRAST TECHNIQUE: Contiguous axial images were obtained from the base of the skull through the vertex without intravenous contrast. COMPARISON:  None. FINDINGS: Brain: No evidence of acute infarction, hemorrhage, hydrocephalus, extra-axial collection or mass lesion/mass effect. Age-related cerebral atrophy with compensatory dilatation of the ventricles. Vascular: No hyperdense vessel or unexpected calcification. Skull: Normal. Negative for fracture or focal lesion.  Sinuses/Orbits: Mild mucosal thickening of the left maxillary sinus. The remaining paranasal sinuses and bilateral mastoid air cells are clear. The orbits are unremarkable. Other: None. IMPRESSION: No acute intracranial abnormality. Electronically Signed   By: Obie DredgeWilliam T Derry M.D.   On: 03/01/2017 09:52   Dg Chest Port 1 View  Result Date: 03/06/2017 CLINICAL DATA:  Respiratory failure . EXAM: PORTABLE CHEST 1 VIEW COMPARISON:  03/05/2017. FINDINGS: Interim extubation. Cardiac pacer noted with lead tips in right atrium right ventricle. Cardiomegaly with normal pulmonary vascularity. Low lung volumes with bibasilar atelectasis/infiltrate. Tiny bilateral pleural effusions cannot be excluded. No pneumothorax. IMPRESSION: 1.  Interim extubation and removal of NG tube. 2. Persistent bibasilar atelectasis/infiltrates. Tiny bilateral pleural effusions cannot be excluded . Chest is unchanged from prior exam . 3.  Cardiac pacer in stable position.  Stable cardiomegaly. Electronically Signed   By: Maisie Fushomas  Register   On: 03/06/2017 07:10   Dg Chest Port 1 View  Result Date: 03/01/2017 CLINICAL DATA:  Altered level of consciousness EXAM: PORTABLE CHEST 1 VIEW COMPARISON:  03/01/2017 FINDINGS: Support devices are stable. Low lung volumes with bibasilar atelectasis and mild vascular congestion. Mild cardiomegaly. Possible small layering effusions. IMPRESSION: Low lung volumes with bibasilar atelectasis and small layering effusions. Mild vascular congestion. Electronically Signed   By: Charlett NoseKevin  Dover M.D.   On: 03/01/2017 09:18   Dg Chest Port 1 View  Result Date: 03/01/2017 CLINICAL DATA:  Intubation EXAM: PORTABLE CHEST 1 VIEW COMPARISON:  02/28/2017 FINDINGS: ET tube tip is 1.6 cm above the carina. Right-sided central line terminates at the expected location of the cavoatrial junction. Enteric tube extends below the diaphragm and beyond the inferior edge of the image. No pneumothorax. Persistent central and basilar  opacities without significant interval change. IMPRESSION: 1. Satisfactorily positioned support equipment. 2. No significant interval change in the bilateral airspace opacities. 3. No pneumothorax Electronically Signed   By: Ellery Plunkaniel R Mitchell M.D.   On: 03/01/2017 03:53   Dg Chest Port 1 View  Result Date: 02/28/2017 CLINICAL DATA:  Vomiting for several days EXAM: PORTABLE CHEST 1 VIEW COMPARISON:  None.  FINDINGS: Cardiac shadow is mildly enlarged. Pacing device is noted. Mild left basilar opacity and small effusion are seen. The right lung is clear. The bony structures are within normal limits. IMPRESSION: Left basilar opacity with associated small effusion. Electronically Signed   By: Alcide Clever M.D.   On: 02/28/2017 22:25    ECG & Cardiac Imaging    02/28/2017: V paced, 110  Assessment & Plan    1.  Elevated troponin:  Pt presented 7/25 with n, v, diarrhea, abd pain, ischemic bowel, sepsis, and resp failure requiring intubation.  In that setting, he had mild troponin elevation to a peak of 0.09 with a flat trend (0.08  0.06  0.09  0.09).  An echo was performed and show an EF of 40%.  Pt has a prior cardiac history, though the extent of it is unclear.  He has a ppm and thinks he may have had an MI in the past, though isn't sure about other w/u or procedures.  He denies chest pain currently and is very groggy.  It's reasonable to pursue an ischemic evaluation at some point however, with ongoing delirium and altered mental status, I would defer, likely until he has fully recovered from this event and can be seen in the outpt setting.  Cont acei.  Will add low-dose  blocker.  Hold off on statin for now given mild LFT abnormalities.  If ok with surgery, would look to add low-dose ASA.  2.    Cardiomyopathy:  EF 40% by echo.  We don't know if this is acute or chronic.  Outside records (a [place] in [place] is all he can relay @ this point) would be helpful.  He does have significant bilat lower ext  edema, which I suspect is more likely related to hypoalbuminemia than severe CHF.  Cont feeding supplementation.  Currently on low dose lasix as well.  Will add TEDS.  As above, would defer any ischemic eval until he further recovers.  Cont acei.  Will add low-dose  blocker as pressures have been stable.  3.  Bipolar d/o / Delirium:  Per psychiatry.  4.  Sepsis:  Hemodynamically stable.  Now off of abx.  5. Acute resp failure:  S/p extubation 7/29.  Stable.  6.  Hypokalemia:  Supplementation ordered.  7.  History of pacemaker:  Placed in [place].  It does not appear that he has EP f/u.  Will have to clarify.  I can't tell what kind of device it is based on CXR.  Will have to get him into our EP clinic following d/c.  Signed, Nicolasa Ducking, NP 03/09/2017, 4:13 PM

## 2017-03-09 NOTE — Progress Notes (Signed)
[facility] - [place] at [facility]   PATIENT NAME: Alvin [name]    MR#:  [id number]  DATE OF BIRTH:  07/27/1941  SUBJECTIVE:   Still confused, started oral intake after Speech study today.  REVIEW OF SYSTEMS:    Review of Systems  Unable to perform ROS: Mental acuity    Nutrition: Tube feeds Tolerating Diet: Yes Tolerating PT: Await Eval.  DRUG ALLERGIES:  No Known Allergies  VITALS:  Blood pressure 140/84, pulse 71, temperature 98.4 F (36.9 C), temperature source Oral, resp. rate (!) 22, height 5\' 8"  (1.727 m), weight 238 lb (108 kg), SpO2 92 %.  PHYSICAL EXAMINATION:   Physical Exam  GENERAL:  76 y.o.-year-old patient lying in bed in NAD. EYES: Right Pupil is fixed and dilated and not responsive to light. No scleral icterus.  HEENT: Head atraumatic, normocephalic. NG tube in place.    NECK:  Supple, no jugular venous distention. No thyroid enlargement, no tenderness.  LUNGS: right lung wheezing and rhonchi. No use of accessory muscles of respiration.  CARDIOVASCULAR: S1, S2 normal. No murmurs, rubs, or gallops.  ABDOMEN: Soft, nontender, nondistended. Bowel sounds Hypoactive but present. No organomegaly or mass. + mid-abdomen staples in place with no acute drainage.  EXTREMITIES: No cyanosis, clubbing, but Bilateral leg edema 2+.    NEUROLOGIC: Globally weak. PSYCHIATRIC: confused, not follow commands.  SKIN: No obvious rash, lesion, or ulcer.    LABORATORY PANEL:   CBC  Recent Labs Lab 03/09/17 0434  WBC 21.0*  HGB 12.7*  HCT 37.1*  PLT 196   ------------------------------------------------------------------------------------------------------------------  Chemistries   Recent Labs Lab 03/06/17 0421 03/07/17 0449  03/09/17 0433  NA 149* 151*  < > 145  K 3.7 3.1*  < > 3.4*  CL 122* 122*  < > 115*  CO2 23 25  < > 24  GLUCOSE 224* 215*  < > 121*  BUN 42* 38*  < > 26*  CREATININE 1.39* 1.26*  < > 0.94  CALCIUM 7.7* 7.9*  < > 7.6*   MG 2.2 2.3  --   --   AST 47*  --   --   --   ALT 30  --   --   --   ALKPHOS 72  --   --   --   BILITOT 0.6  --   --   --   < > = values in this interval not displayed. ------------------------------------------------------------------------------------------------------------------  Cardiac Enzymes No results for input(s): TROPONINI in the last 168 hours. ------------------------------------------------------------------------------------------------------------------  RADIOLOGY:  Dg Abd 1 View  Result Date: 03/08/2017 CLINICAL DATA:  Feeding tube placement. EXAM: ABDOMEN - 1 VIEW COMPARISON:  03/08/2017.  03/08/2017 FINDINGS: Feeding tube noted with its tip projected over the stomach. No gastric distention. Distended loops of small bowel again noted. Surgical staples noted over the abdomen. Cardiac pacer wire noted over the right ventricle. Small left pleural effusion cannot be excluded . IMPRESSION: 1. Feeding tube noted with its tip projected over the stomach. No gastric distention. Distended loops of small bowel again noted. 2. Small left pleural effusion . Electronically Signed   By: Maisie Fushomas  Register   On: 03/08/2017 17:07   Dg Abd 1 View  Result Date: 03/08/2017 CLINICAL DATA:  Feeding tube placed EXAM: ABDOMEN - 1 VIEW COMPARISON:  818 FINDINGS: feeding tube with tip in the gastric antrum versus first portion duodenum . IMPRESSION: Feeding tube with tip in the gastric antrum/first portion duodenum. Stylet remains. Electronically Signed   By:  Genevive BiStewart  Edmunds M.D.   On: 03/08/2017 16:32   Dg Abd 1 View  Result Date: 03/08/2017 CLINICAL DATA:  Abdominal pain EXAM: ABDOMEN - 1 VIEW COMPARISON:  March 07, 2017 FINDINGS: Feeding tube tip is in stomach. There is postoperative change with surgical clips just to the right of midline in the lower abdomen or pelvis. There are loops of borderline dilated small bowel. No air-fluid level. No free air evident. IMPRESSION: Suspect a degree of  postoperative ileus. Obstruction not felt to be likely. No free air. Feeding tube tip in stomach. Electronically Signed   By: Bretta BangWilliam  Woodruff III M.D.   On: 03/08/2017 14:11   Dg Abd 1 View  Result Date: 03/07/2017 CLINICAL DATA:  Feeding tube dysfunction.  Patient pulled on tube EXAM: ABDOMEN - 1 VIEW COMPARISON:  03/06/2017 FINDINGS: Feeding tube coiled in the body the stomach similar position to the prior study. No dilated bowel loops. Left lower lobe consolidation. IMPRESSION: Feeding tube remains coiled in the body the stomach. Electronically Signed   By: Marlan Palauharles  Clark M.D.   On: 03/07/2017 18:58     ASSESSMENT AND PLAN:   76 year old male with past medical history of hypertension, bipolar disorder, who presented to the hospital due to abdominal pain, nausea or diarrhea and suspected to have ischemic bowel and status post exploratory laparotomy which was negative for ischemic bowel patient now is severe sepsis with septic shock secondary to pneumonia.  1. Severe sepsis with septic shock- secondary to pneumonia and suspected Aspiration Pneumonia.  - Improved and off vasopressors. Leukocytosis is better at 21,000. procalcitonin is 1.07. discoontinue Zosyn, follow up CBC.  2. Acute respiratory failure-secondary to pneumonia/ARDS. -Extubated and on room air with O2 sats in the low 90s and clinically stable. Continue aggressive pulmonary toileting.  3. Abdominal pain/distention-patient is status post exploratory laparotomy showing no evidence of ischemic bowel. Per general surgeon,  advanced tube feedings and discontinued TPN. Abdominal x-ray show ileus. But the patient had bowel movement.  Started on liquid diet after speech study.  4. Hypernatremia, improving with D5W. Discontinued D5W due to wheezing and leg edema.  Bilateral leg edema. Possible due to hypoalbuminemia and acute systolic and diastolic CHF.   I discontinued IV fluids, given Lasix 20 mg IV 1 dose, follow-up  echocardiogram: MIld to moderate Systolic dysfunction and mild Diastolic dysfunction with mild MR. Continue lasix iv, add lisinopril, cardiology consult.  5. Hypokalemia. give potassium supplement.  6. Elevated troponin-secondary to supply demand ischemia, No evidence of acute coronary syndrome.  7. Leukocytosis-secondary to sepsis  Discont. IV Zosyn and follow-up CBC.  8. Hx of Bipolar Disorder - cont. Depakote.   9. Hyperlipidemia - cont. Atorvastatin  10. Hx of Gout - cont. Allopurinol.   11. Delirium.  restarted his 15 mg of Zyprexa at night per Psych consult.   All the records are reviewed and case discussed with Care Management/Social Worker. Management plans discussed with the patient, family and they are in agreement.  CODE STATUS: DNR  DVT Prophylaxis: Hep SQ  TOTAL TIME TAKING CARE OF THIS PATIENT: 36 minutes.   POSSIBLE D/C unclear, DEPENDING ON CLINICAL CONDITION.   Shaune Pollackhen, Malayzia Laforte M.D on 03/09/2017 at 3:48 PM  Between 7am to 6pm - Pager - [phone]  After 6pm go to [email or web address] - Social research officer, governmentpassword EPAS [facility]  [facility]  Office  [phone]  CC: Primary care physician; System, Pcp Not In

## 2017-03-09 NOTE — Evaluation (Addendum)
Clinical/Bedside Swallow Evaluation Patient Details  Name: Alvin [name] MRN: [id number] Date of Birth: [birth year]  Today's Date: 03/09/2017 Time: SLP Start Time (ACUTE ONLY): 0930 SLP Stop Time (ACUTE ONLY): 1030 SLP Time Calculation (min) (ACUTE ONLY): 60 min  Past Medical History:  Past Medical History:  Diagnosis Date  . Bipolar disorder (HCC)   . BPH (benign prostatic hyperplasia)   . Cardiomyopathy (HCC)    a. 03/2017 Echo: EF 40%, mild diastolic dysfxn. Mild MR.  . Gout   . 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  . Hyperlipidemia   . Hypertension    Past Surgical History:  Past Surgical History:  Procedure Laterality Date  . LAPAROTOMY N/A 03/01/2017   Procedure: EXPLORATORY LAPAROTOMY;  Surgeon: Ancil Linsey, MD;  Location: [facility] ORS;  Service: General;  Laterality: N/A;   HPI:  Pt is a 76 y.o. male [facility] with a known history of Bipolar disorder, hypertension presents to the emergency department for evaluation of respiratory distress.  Patient was in a usual state of health until 3-4 days ago when he describes the onset of abdominal pain, midepigastric and right lower quadrant associated with nausea, vomiting and diarrhea. He developed gradually worsening weakness secondary decreased by mouth intake. Today he became confused and acutely short of breath. Pt  lives in an assisted living facility and is independent with his activities of daily living. Due to pt's s/s, surgery was contacted for evaluation and management of small bowel ischemia which resulted in exploratory laparatomy. Pt was orally intubated on 03/01/17 d/t respiratory distress, now extubated. Pt had NG TFs(but tube came out) and TPN until now. Pt is awake, verbally attempting to talk at times w/ mumbled speech, however, pt often drifts off to sleep d/t frequent apnea-appaering moments (3-4x during a minute). Pt does attempt to follow commands.   Assessment / Plan / Recommendation Clinical  Impression  Pt appears to present w/ possible pharyngeal phase dysphagia potentially impacted by pt's Esophageal phase issues - such may be stemming from recent abdominal issues and exploratory laparatomy/small bowel issues. Pt appeared to adequately tolerate trials of thin liquids and purees w/ no overt oral phase deficits noted and no immediate s/s of aspiration during the pharyngeal phase of swallowing. Pt exhibited no immediate coughing or throat clearing; vocal quality was clear post trials. However, post a few trials, pt appeared to exhibit abdominal discomfort and increased respiratory effort w/ min belching and throat clearing/coughing noted x2-3. Pt continued to exhibit fairly constant apnea-appearing episodes despite verbal/tactile cues. SLP had to carefully monitor when feeding pt in order that pt not have food or liquid in his mouth during apnea moments. Pt's overall Cognitive status appeared declined; he required min+ verbal/tactile cues to attend to tasks at times.  Due to pt's overall increased risk for aspiration currently, recommend Nectar consistency liquids in presence of a full liquid-puree diet d/t Cognitive status and missing dentition. MD consulted and agreed d/t his GI status baseline. ST services will continue to f/u w/ toleration of diet and trials to upgrade diet as appropriate. Will f/u w/ objective assessment as indicated. NSG updated; recommended Pills in Puree crushed if able.   SLP Visit Diagnosis: Dysphagia, pharyngoesophageal phase (R13.14)    Aspiration Risk  Mild aspiration risk;Moderate aspiration risk    Diet Recommendation   Full liquids diet - Nectar consistency; aspiration precautions; Reflux precautions  Medication Administration: Crushed with puree    Other  Recommendations Recommended Consults:  (Dietician  f/u) Oral Care Recommendations: Oral care BID;Staff/trained caregiver to provide oral care Other Recommendations: Order thickener from pharmacy;Prohibited  food (jello, ice cream, thin soups);Remove water pitcher;Have oral suction available   Follow up Recommendations Skilled Nursing facility      Frequency and Duration min 3x week  2 weeks       Prognosis Prognosis for Safe Diet Advancement: Fair Barriers to Reach Goals: Cognitive deficits      Swallow Study   General Date of Onset: 02/28/17 HPI: Pt is a 76 y.o. male [facility] patient with a known history of Bipolar disorder, hypertension presents to the emergency department for evaluation of respiratory distress.  Patient was in a usual state of health until 3-4 days ago when he describes the onset of abdominal pain, midepigastric and right lower quadrant associated with nausea, vomiting and diarrhea. He developed gradually worsening weakness secondary decreased by mouth intake. Today he became confused and acutely short of breath. Pt  lives in an assisted living facility and is independent with his activities of daily living. Due to pt's s/s, surgery was contacted for evaluation and management of small bowel ischemia which resulted in exploratory laparatomy. Pt was orally intubated on 03/01/17 d/t respiratory distress, now extubated. Pt had NG TFs(but tube came out) and TPN until now. Pt is awake, verbally attempting to talk at times w/ mumbled speech, however, pt often drifts off to sleep d/t frequent apnea-appaering moments (3-4x during a minute). Pt does attempt to follow commands. Type of Study: Bedside Swallow Evaluation Previous Swallow Assessment: none Diet Prior to this Study: NG Tube (TPN since admission/abdominal surgery; regular at home) Temperature Spikes Noted: No (wbc 21) Respiratory Status: Room air History of Recent Intubation: Yes Length of Intubations (days): 5 days Date extubated: 03/05/17 Behavior/Cognition: Cooperative;Pleasant mood;Confused;Distractible;Requires cueing (awake w/ apnea-appearing moments) Oral Cavity Assessment: Dry Oral Care Completed by SLP: Recent  completion by staff Oral Cavity - Dentition: Poor condition;Missing dentition (most missing) Vision:  (n/a) Self-Feeding Abilities: Needs assist;Needs set up;Total assist Patient Positioning: Upright in bed Baseline Vocal Quality: Low vocal intensity (mumbled speech) Volitional Cough: Cognitively unable to elicit Volitional Swallow: Unable to elicit    Oral/Motor/Sensory Function Overall Oral Motor/Sensory Function: Within functional limits (grossly wfl for movements during bolus management)   Ice Chips Ice chips: Within functional limits Presentation: Spoon (3 trials)   Thin Liquid Thin Liquid: Impaired Presentation: Cup;Self Fed (assisted; 4 trials) Oral Phase Impairments:  (fair) Oral Phase Functional Implications:  (fair) Pharyngeal  Phase Impairments: Throat Clearing - Delayed;Cough - Delayed (unsure if related to immediate swallow of bolus or reflux) Other Comments: pt appeared to exhibit abdominal discomfort w/ the continued trials of oral intake w/ delayed belching and throat clearing/coughing noted x2-3; vocal quality was clear post trials when assessed    Nectar Thick Nectar Thick Liquid: Impaired Presentation: Cup;Self Fed;Straw (8 trials) Oral Phase Impairments:  (none) Oral phase functional implications:  (none) Pharyngeal Phase Impairments: Cough - Delayed;Throat Clearing - Delayed (x1 each after apparent abdominal discomfort noted) Other Comments: similar to the presentation w/ trials of thin liquids but less frequent; vocal quality was clear post trials when assesseed   Honey Thick Honey Thick Liquid: Not tested   Puree Puree: Impaired Presentation: Spoon (fed; 6 trials) Oral Phase Impairments:  (none) Oral Phase Functional Implications:  (none) Pharyngeal Phase Impairments: Throat Clearing - Delayed (x1) Other Comments: appeared to be similar to presentation w/ other trials   Solid   GO   Solid: Not tested  Jerilynn SomKatherine Watson, MS,  CCC-SLP Watson,Katherine 03/09/2017,4:03 PM

## 2017-03-09 NOTE — Consult Note (Signed)
[facility] Consult   Reason for Consult:  Consult for 76 year old man with a history of bipolar disorder currently confused and agitated Referring Physician:  Verdell Carmine Patient Identification: Alvin [name] MRN:  [id number] Principal Diagnosis: Subacute delirium Diagnosis:   Patient Active Problem List   Diagnosis Date Noted  . Subacute delirium [F05] 03/07/2017  . Bipolar disorder ([place]) [F31.9] 03/07/2017  . 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 Acute respiratory failure with hypoxemia ([place]) [J96.01]   . Abdominal distention [R14.0]   . Lactic acidosis [E87.2]   . Elevated lipase [R74.8]   . Severe sepsis with septic shock ([place]) [A41.9, R65.21] 02/28/2017    Total Time spent with patient: 20 minutes  Subjective:   Alvin [name] is a 76 y.o. male patient admitted with "get these off me".  Follow-up for this patient with a history of bipolar disorder currently delirious recovering from serious medical problems. Patient was awake. He was still not able to speak in a lucid manner or answer direct questions or follow any commands. He was not agitated did not appear to be violent or trying to crawl out of bed.  Follow-up Thursday, August 2 for patient with history of bipolar disorder. Today I got to speak with the patient and his daughter who was present. He is clearly better than yesterday. The NG tube is out in the mittens are off. Patient was still not able to carry on a full conversation but was able to answer a few questions. He is starting to eat a little bit today.  HPI:  Patient interviewed chart reviewed. 76 year old man with a reported past history of bipolar disorder was admitted to the hospital from the [facility] with acute illness including abdominal pain. Patient had exploratory laparotomy which was unrevealing but has subsequently developed pneumonia. Patient is now out of the intensive care unit but remains confused and agitated. Has pulled out lines and  is requiring mittens and special precautions for safety. Patient was interviewed but was unable to give any information. He repeats himself frequently and could not answer questions lucidly. Judging from the past history prior to admission the patient was thought to be a lucid state of mind and was fairly independent at the [facility]. Currently he is receiving nutrition by an NG tube and is not able to get his oral medicines as well as previously.  Social history: Patient lives at the [facility]. Adult children are involved making decisions. He also has a history of being a veteran but apparently the family has preferred not to go to the [facility].  Medical history: Patient is recovering from pneumonia and sepsis among other medical problems  Substance abuse history: No acute or recent substance abuse issues    Past Psychiatric History: Reported history of bipolar disorder and was on modest dose of Depakote and olanzapine prior to admission. Was described as having been psychiatrically stable. Evidently has had treatment at the [facility] in the past. Unclear when and if ever he has had a last hospitalization unknown if he is ever had suicide attempts  Risk to Self: Is patient at risk for suicide?: No Risk to Others:   Prior Inpatient Therapy:   Prior Outpatient Therapy:    Past Medical History:  Past Medical History:  Diagnosis Date  . Bipolar disorder ([place])   . BPH (benign prostatic hyperplasia)   . Cardiomyopathy ([place])    a. 03/2017 Echo: EF 40%, mild diastolic dysfxn. Mild MR.  . Gout   . 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  . Hyperlipidemia   . Hypertension     Past Surgical History:  Procedure Laterality Date  . LAPAROTOMY N/A 03/01/2017   Procedure: EXPLORATORY LAPAROTOMY;  Surgeon: Vickie Epley, MD;  Location: [facility] ORS;  Service: General;  Laterality: N/A;   Family History: History reviewed. No pertinent family history. Family Psychiatric  History:  Unknown Social History:  History  Alcohol Use No     History  Drug Use No    Social History   Social History  . Marital status: Divorced    Spouse name: N/A  . Number of children: N/A  . Years of education: N/A   Social History Main Topics  . Smoking status: Never Smoker  . Smokeless tobacco: Never Used  . Alcohol use No  . Drug use: No  . Sexual activity: Not Asked   Other Topics Concern  . None   Social History Narrative   Pt says that he lives in [place] with his sister, though notes indicate that he lives in an assisted living facitliy.  He does not routinely exercise.   Additional Social History:    Allergies:  No Known Allergies  Labs:  Results for orders placed or performed during the hospital encounter of 02/28/17 (from the past 48 hour(s))  Glucose, capillary     Status: Abnormal   Collection Time: 03/07/17  8:34 PM  Result Value Ref Range   Glucose-Capillary 171 (H) 65 - 99 mg/dL  Glucose, capillary     Status: Abnormal   Collection Time: 03/07/17 11:52 PM  Result Value Ref Range   Glucose-Capillary 143 (H) 65 - 99 mg/dL  Glucose, capillary     Status: Abnormal   Collection Time: 03/08/17  3:58 AM  Result Value Ref Range   Glucose-Capillary 147 (H) 65 - 99 mg/dL  Basic metabolic panel     Status: Abnormal   Collection Time: 03/08/17  6:24 AM  Result Value Ref Range   Sodium 146 (H) 135 - 145 mmol/L   Potassium 3.6 3.5 - 5.1 mmol/L   Chloride 117 (H) 101 - 111 mmol/L   CO2 23 22 - 32 mmol/L   Glucose, Bld 207 (H) 65 - 99 mg/dL   BUN 31 (H) 6 - 20 mg/dL   Creatinine, Ser 1.05 0.61 - 1.24 mg/dL   Calcium 7.8 (L) 8.9 - 10.3 mg/dL   GFR calc non Af Amer >60 >60 mL/min   GFR calc Af Amer >60 >60 mL/min    Comment: (NOTE) The eGFR has been calculated using the CKD EPI equation. This calculation has not been validated in all clinical situations. eGFR's persistently <60 mL/min signify possible Chronic Kidney Disease.    Anion gap 6 5 - 15  CBC      Status: Abnormal   Collection Time: 03/08/17  6:24 AM  Result Value Ref Range   WBC 25.8 (H) 3.8 - 10.6 K/uL   RBC 3.80 (L) 4.40 - 5.90 MIL/uL   Hemoglobin 12.8 (L) 13.0 - 18.0 g/dL   HCT 36.7 (L) 40.0 - 52.0 %   MCV 96.7 80.0 - 100.0 fL   MCH 33.8 26.0 - 34.0 pg   MCHC 35.0 32.0 - 36.0 g/dL   RDW 14.1 11.5 - 14.5 %   Platelets 198 150 - 440 K/uL  Glucose, capillary     Status: Abnormal   Collection Time: 03/08/17  6:42 AM  Result Value Ref Range   Glucose-Capillary  192 (H) 65 - 99 mg/dL  Glucose, capillary     Status: Abnormal   Collection Time: 03/08/17  8:02 AM  Result Value Ref Range   Glucose-Capillary 166 (H) 65 - 99 mg/dL  Glucose, capillary     Status: Abnormal   Collection Time: 03/08/17 11:46 AM  Result Value Ref Range   Glucose-Capillary 222 (H) 65 - 99 mg/dL  Procalcitonin - Baseline     Status: None   Collection Time: 03/08/17  2:22 PM  Result Value Ref Range   Procalcitonin 1.07 ng/mL    Comment:        Interpretation: PCT > 0.5 ng/mL and <= 2 ng/mL: Systemic infection (sepsis) is possible, but other conditions are known to elevate PCT as well. (NOTE)         ICU PCT Algorithm               Non ICU PCT Algorithm    ----------------------------     ------------------------------         PCT < 0.25 ng/mL                 PCT < 0.1 ng/mL     Stopping of antibiotics            Stopping of antibiotics       strongly encouraged.               strongly encouraged.    ----------------------------     ------------------------------       PCT level decrease by               PCT < 0.25 ng/mL       >= 80% from peak PCT       OR PCT 0.25 - 0.5 ng/mL          Stopping of antibiotics                                             encouraged.     Stopping of antibiotics           encouraged.    ----------------------------     ------------------------------       PCT level decrease by              PCT >= 0.25 ng/mL       < 80% from peak PCT        AND PCT >= 0.5 ng/mL              Continuing antibiotics                                              encouraged.       Continuing antibiotics            encouraged.    ----------------------------     ------------------------------     PCT level increase compared          PCT > 0.5 ng/mL         with peak PCT AND          PCT >= 0.5 ng/mL             Escalation of antibiotics  strongly encouraged.      Escalation of antibiotics        strongly encouraged.   Glucose, capillary     Status: Abnormal   Collection Time: 03/08/17  5:23 PM  Result Value Ref Range   Glucose-Capillary 165 (H) 65 - 99 mg/dL  Glucose, capillary     Status: Abnormal   Collection Time: 03/08/17  8:22 PM  Result Value Ref Range   Glucose-Capillary 145 (H) 65 - 99 mg/dL  Glucose, capillary     Status: Abnormal   Collection Time: 03/09/17 12:10 AM  Result Value Ref Range   Glucose-Capillary 137 (H) 65 - 99 mg/dL  Basic metabolic panel     Status: Abnormal   Collection Time: 03/09/17  4:33 AM  Result Value Ref Range   Sodium 145 135 - 145 mmol/L   Potassium 3.4 (L) 3.5 - 5.1 mmol/L   Chloride 115 (H) 101 - 111 mmol/L   CO2 24 22 - 32 mmol/L   Glucose, Bld 121 (H) 65 - 99 mg/dL   BUN 26 (H) 6 - 20 mg/dL   Creatinine, Ser 0.94 0.61 - 1.24 mg/dL   Calcium 7.6 (L) 8.9 - 10.3 mg/dL   GFR calc non Af Amer >60 >60 mL/min   GFR calc Af Amer >60 >60 mL/min    Comment: (NOTE) The eGFR has been calculated using the CKD EPI equation. This calculation has not been validated in all clinical situations. eGFR's persistently <60 mL/min signify possible Chronic Kidney Disease.    Anion gap 6 5 - 15  Glucose, capillary     Status: Abnormal   Collection Time: 03/09/17  4:33 AM  Result Value Ref Range   Glucose-Capillary 121 (H) 65 - 99 mg/dL  CBC     Status: Abnormal   Collection Time: 03/09/17  4:34 AM  Result Value Ref Range   WBC 21.0 (H) 3.8 - 10.6 K/uL   RBC 3.75 (L) 4.40 - 5.90 MIL/uL    Hemoglobin 12.7 (L) 13.0 - 18.0 g/dL   HCT 37.1 (L) 40.0 - 52.0 %   MCV 98.9 80.0 - 100.0 fL   MCH 33.9 26.0 - 34.0 pg   MCHC 34.3 32.0 - 36.0 g/dL   RDW 14.1 11.5 - 14.5 %   Platelets 196 150 - 440 K/uL  Glucose, capillary     Status: Abnormal   Collection Time: 03/09/17  7:51 AM  Result Value Ref Range   Glucose-Capillary 109 (H) 65 - 99 mg/dL  Glucose, capillary     Status: Abnormal   Collection Time: 03/09/17 11:52 AM  Result Value Ref Range   Glucose-Capillary 144 (H) 65 - 99 mg/dL  Glucose, capillary     Status: Abnormal   Collection Time: 03/09/17  5:28 PM  Result Value Ref Range   Glucose-Capillary 158 (H) 65 - 99 mg/dL    Current Facility-Administered Medications  Medication Dose Route Frequency Provider Last Rate Last Dose  . acetaminophen (TYLENOL) tablet 650 mg  650 mg Oral Q6H PRN Hugelmeyer, Alexis, DO       Or  . acetaminophen (TYLENOL) suppository 650 mg  650 mg Rectal Q6H PRN Hugelmeyer, Alexis, DO      . albuterol (PROVENTIL) (2.5 MG/3ML) 0.083% nebulizer solution 2.5 mg  2.5 mg Nebulization Q6H Wilhelmina Mcardle, MD   2.5 mg at 03/09/17 1333  . bisacodyl (DULCOLAX) EC tablet 5 mg  5 mg Oral Daily PRN Hugelmeyer, Alexis, DO   5 mg at 03/03/17 0916  .  feeding supplement (PRO-STAT SUGAR FREE 64) liquid 30 mL  30 mL Per Tube TID Olean Ree, MD   30 mL at 03/09/17 1810  . fentaNYL (SUBLIMAZE) injection 25 mcg  25 mcg Intravenous Q2H PRN Wilhelmina Mcardle, MD      . furosemide (LASIX) injection 20 mg  20 mg Intravenous Q12H Demetrios Loll, MD   20 mg at 03/09/17 1811  . haloperidol lactate (HALDOL) injection 2.5 mg  2.5 mg Intravenous Q6H PRN Henreitta Leber, MD      . heparin injection 5,000 Units  5,000 Units Subcutaneous Q8H Laverle Hobby, MD   5,000 Units at 03/09/17 1539  . insulin aspart (novoLOG) injection 0-5 Units  0-5 Units Subcutaneous QHS Demetrios Loll, MD      . insulin aspart (novoLOG) injection 0-9 Units  0-9 Units Subcutaneous TID WC Demetrios Loll, MD    2 Units at 03/09/17 1809  . lisinopril (PRINIVIL,ZESTRIL) tablet 2.5 mg  2.5 mg Oral Daily Demetrios Loll, MD   2.5 mg at 03/09/17 1810  . metoCLOPramide (REGLAN) injection 10 mg  10 mg Intravenous Q8H PRN Demetrios Loll, MD   10 mg at 03/08/17 1140  . metoprolol tartrate (LOPRESSOR) tablet 12.5 mg  12.5 mg Oral BID Rogelia Mire, NP      . OLANZapine zydis (ZYPREXA) disintegrating tablet 15 mg  15 mg Oral QHS Clapacs, Madie Reno, MD   15 mg at 03/08/17 2245  . ondansetron (ZOFRAN) injection 4 mg  4 mg Intravenous Q6H PRN Hugelmeyer, Alexis, DO      . oxyCODONE-acetaminophen (PERCOCET/ROXICET) 5-325 MG per tablet 1 tablet  1 tablet Per Tube Q6H PRN Demetrios Loll, MD   1 tablet at 03/08/17 1140  . senna-docusate (Senokot-S) tablet 1 tablet  1 tablet Oral QHS PRN Hugelmeyer, Alexis, DO      . sodium chloride flush (NS) 0.9 % injection 3 mL  3 mL Intravenous Q12H Hugelmeyer, Alexis, DO   3 mL at 03/09/17 1779  . thiamine (B-1) injection 100 mg  100 mg Intravenous Daily Pernell Dupre, RPH   100 mg at 03/09/17 0945  . valproate (DEPACON) 250 mg in dextrose 5 % 50 mL IVPB  250 mg Intravenous TID Henreitta Leber, MD 52.5 mL/hr at 03/09/17 1811 250 mg at 03/09/17 1811    Musculoskeletal: Strength & Muscle Tone: decreased Gait & Station: unable to stand Patient leans: N/A  Psychiatric Specialty Exam: Physical Exam  Nursing note and vitals reviewed. Constitutional: He appears well-developed and well-nourished. No distress.  HENT:  Head: Normocephalic and atraumatic.  Eyes: Pupils are equal, round, and reactive to light. Conjunctivae are normal.  Neck: Normal range of motion.  Cardiovascular: Normal heart sounds.   Respiratory: Effort normal.  GI: Soft.  Musculoskeletal: Normal range of motion.  Neurological: He is alert.  Skin: Skin is warm and dry.  Psychiatric: He has a normal mood and affect. His affect is not labile. His speech is delayed. His speech is not tangential. He is not agitated.  Cognition and memory are impaired. He does not express impulsivity. He expresses no homicidal and no suicidal ideation. He is noncommunicative.    Review of Systems  Unable to perform ROS: Mental status change    Blood pressure 125/78, pulse 75, temperature 98.6 F (37 C), resp. rate 20, height 5' 8" (1.727 m), weight 238 lb (108 kg), SpO2 95 %.Body mass index is 36.19 kg/m.  General Appearance: Disheveled  Eye Contact:  Minimal  Speech:  [id number]  Volume:  Decreased  Mood:  Dysphoric  Affect:  Congruent  Thought Process:  Disorganized  Orientation:  Negative  Thought Content:  Illogical and Rumination  Suicidal Thoughts:  No  Homicidal Thoughts:  No  Memory:  Negative  Judgement:  Negative  Insight:  Negative  Psychomotor Activity:  Restlessness  Concentration:  Concentration: Poor  Recall:  Poor  Fund of Knowledge:  Poor  Language:  Fair  Akathisia:  No  Handed:  Right  AIMS (if indicated):     Assets:  Financial Resources/Insurance Housing Social Support  ADL's:  Impaired  Cognition:  Impaired,  Moderate  Sleep:        Treatment Plan Summary: Daily contact with patient to assess and evaluate symptoms and progress in treatment, Medication management and Plan Patient gradually returning to baseline as medical problems improved. We will make sure he is on his usual outpatient psychiatric medicine and now can take everything orally. Follow-up as needed.  Disposition: Patient does not meet criteria for psychiatric inpatient admission. Supportive therapy provided about ongoing stressors.  Alethia Berthold, MD 03/09/2017 7:01 PM

## 2017-03-10 ENCOUNTER — Inpatient Hospital Stay: Payer: Medicare Other

## 2017-03-10 LAB — GLUCOSE, CAPILLARY
GLUCOSE-CAPILLARY: 164 mg/dL — AB (ref 65–99)
GLUCOSE-CAPILLARY: 151 mg/dL — AB (ref 65–99)
Glucose-Capillary: 223 mg/dL — ABNORMAL HIGH (ref 65–99)
Glucose-Capillary: 152 mg/dL — ABNORMAL HIGH (ref 65–99)
Glucose-Capillary: 148 mg/dL — ABNORMAL HIGH (ref 65–99)

## 2017-03-10 LAB — BASIC METABOLIC PANEL
ANION GAP: 7 (ref 5–15)
BUN: 26 mg/dL — ABNORMAL HIGH (ref 6–20)
CALCIUM: 8 mg/dL — AB (ref 8.9–10.3)
CO2: 27 mmol/L (ref 22–32)
CREATININE: 1.07 mg/dL (ref 0.61–1.24)
Chloride: 114 mmol/L — ABNORMAL HIGH (ref 101–111)
Glucose, Bld: 166 mg/dL — ABNORMAL HIGH (ref 65–99)
Potassium: 3.6 mmol/L (ref 3.5–5.1)
SODIUM: 148 mmol/L — AB (ref 135–145)

## 2017-03-10 LAB — C DIFFICILE QUICK SCREEN W PCR REFLEX
C DIFFICILE (CDIFF) INTERP: NOT DETECTED
C DIFFICILE (CDIFF) TOXIN: NEGATIVE
C DIFFICLE (CDIFF) ANTIGEN: NEGATIVE

## 2017-03-10 LAB — PROCALCITONIN: PROCALCITONIN: 0.85 ng/mL

## 2017-03-10 LAB — MAGNESIUM: MAGNESIUM: 2.1 mg/dL (ref 1.7–2.4)

## 2017-03-10 MED ORDER — FUROSEMIDE 10 MG/ML IJ SOLN
20.0000 mg | Freq: Two times a day (BID) | INTRAMUSCULAR | Status: DC
  Administered 2017-03-10 – 2017-03-12 (×6): 20 mg via INTRAVENOUS
  Filled 2017-03-10 (×7): qty 2

## 2017-03-10 MED ORDER — CARVEDILOL 6.25 MG PO TABS
6.2500 mg | ORAL_TABLET | Freq: Two times a day (BID) | ORAL | Status: DC
  Administered 2017-03-10 – 2017-03-17 (×15): 6.25 mg via ORAL
  Filled 2017-03-10 (×5): qty 1
  Filled 2017-03-10 (×2): qty 2
  Filled 2017-03-10 (×3): qty 1
  Filled 2017-03-10 (×3): qty 2
  Filled 2017-03-10 (×6): qty 1
  Filled 2017-03-10 (×8): qty 2
  Filled 2017-03-10 (×2): qty 1

## 2017-03-10 MED ORDER — DEXTROMETHORPHAN POLISTIREX ER 30 MG/5ML PO SUER
30.0000 mg | Freq: Two times a day (BID) | ORAL | Status: DC
  Administered 2017-03-10 – 2017-03-17 (×13): 30 mg via ORAL
  Filled 2017-03-10 (×16): qty 5

## 2017-03-10 MED ORDER — ASPIRIN 81 MG PO CHEW
81.0000 mg | CHEWABLE_TABLET | Freq: Every day | ORAL | Status: DC
  Administered 2017-03-10 – 2017-03-17 (×8): 81 mg via ORAL
  Filled 2017-03-10 (×8): qty 1

## 2017-03-10 MED ORDER — ALBUTEROL SULFATE (2.5 MG/3ML) 0.083% IN NEBU: 2.5000 mg | INHALATION_SOLUTION | Freq: Four times a day (QID) | RESPIRATORY_TRACT | Status: DC | PRN

## 2017-03-10 MED ORDER — DIVALPROEX SODIUM 125 MG PO CSDR
500.0000 mg | DELAYED_RELEASE_CAPSULE | Freq: Every day | ORAL | Status: DC
  Administered 2017-03-11 – 2017-03-16 (×6): 500 mg via ORAL
  Filled 2017-03-10 (×8): qty 4

## 2017-03-10 MED ORDER — DM-GUAIFENESIN ER 30-600 MG PO TB12: 1.0000 | ORAL_TABLET | Freq: Two times a day (BID) | ORAL | Status: DC

## 2017-03-10 MED ORDER — DIVALPROEX SODIUM 125 MG PO CSDR
250.0000 mg | DELAYED_RELEASE_CAPSULE | Freq: Every morning | ORAL | Status: DC
  Administered 2017-03-10 – 2017-03-17 (×8): 250 mg via ORAL
  Filled 2017-03-10 (×8): qty 2

## 2017-03-10 MED ORDER — FUROSEMIDE 20 MG PO TABS: 20.0000 mg | ORAL_TABLET | Freq: Every day | ORAL | Status: DC

## 2017-03-10 MED ORDER — GUAIFENESIN ER 600 MG PO TB12
600.0000 mg | ORAL_TABLET | Freq: Two times a day (BID) | ORAL | Status: DC
  Administered 2017-03-11 – 2017-03-12 (×4): 600 mg via ORAL
  Filled 2017-03-10 (×4): qty 1

## 2017-03-10 MED ORDER — GUAIFENESIN-DM 100-10 MG/5ML PO SYRP
5.0000 mL | ORAL_SOLUTION | ORAL | Status: DC | PRN
  Filled 2017-03-10: qty 5

## 2017-03-10 NOTE — Evaluation (Signed)
Objective Swallowing Evaluation: Type of Study: MBS-Modified Barium Swallow Study  Patient Details  Name: Alvin [name] MRN: [id number] Date of Birth: 09/29/1940  Today's Date: 03/10/2017 Time: SLP Start Time (ACUTE ONLY): 1215-SLP Stop Time (ACUTE ONLY): 1315 SLP Time Calculation (min) (ACUTE ONLY): 60 min  Past Medical History:  Past Medical History:  Diagnosis Date  . Bipolar disorder ([facility])   . BPH (benign prostatic hyperplasia)   . Cardiomyopathy ([facility])    a. 03/2017 Echo: EF 40%, mild diastolic dysfxn. Mild MR.  . Gout   . 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  . Hyperlipidemia   . Hypertension    Past Surgical History:  Past Surgical History:  Procedure Laterality Date  . LAPAROTOMY N/A 03/01/2017   Procedure: EXPLORATORY LAPAROTOMY;  Surgeon: Ancil Linseyavis, Jason Evan, MD;  Location: [facility] ORS;  Service: General;  Laterality: N/A;   HPI: Pt is a 76 y.o. male [facility] with a known history of Bipolar disorder, hypertension presents to the emergency department for evaluation of respiratory distress.  Patient was in a usual state of health until 3-4 days ago when he describes the onset of abdominal pain, midepigastric and right lower quadrant associated with nausea, vomiting and diarrhea. He developed gradually worsening weakness secondary decreased by mouth intake. Today he became confused and acutely short of breath. Pt  lives in an assisted living facility and is independent with his activities of daily living. Due to pt's s/s, surgery was contacted for evaluation and management of small bowel ischemia which resulted in exploratory laparatomy. Pt was orally intubated on 03/01/17 d/t respiratory distress, now extubated. Pt had NG TFs(but tube came out) and TPN until now. Pt is awake, verbally attempting to talk at times w/ mumbled speech, however, pt often drifts off to sleep d/t frequent apnea-appaering moments (3-4x during a minute). Pt does attempt to follow commands.   Subjective: Patient awake and responsive, speech difficult to understand (low volume, imprecise articulation  l   Assessment / Plan / Recommendation  CHL IP CLINICAL IMPRESSIONS 03/10/2017  Clinical Impression This 76 year old man with concerns for aspiration per clinical bedside swallow evaluation is presenting with moderate severe oropharyngeal dysphagia characterized by disorganized and prolonged oral management, delayed pharyngeal swallow initiation, reduced hyolaryngeal excursion, reduced tongue base retraction, residue throughout pharynx, and silent aspiration of nectar-thick liquid.  Recommend puree with honey-thick liquid and full supervision for meals.  SLP Visit Diagnosis Dysphagia, oropharyngeal phase (R13.12)  Attention and concentration deficit following --  Frontal lobe and executive function deficit following --  Impact on safety and function --      CHL IP TREATMENT RECOMMENDATION 03/09/2017  Treatment Recommendations Therapy as outlined in treatment plan below     Prognosis 03/10/2017  Prognosis for Safe Diet Advancement Fair  Barriers to Reach Goals Cognitive deficits  Barriers/Prognosis Comment --    CHL IP DIET RECOMMENDATION 03/10/2017  SLP Diet Recommendations Dysphagia 1 (Puree) solids;Honey thick liquids  Liquid Administration via --  Medication Administration --  Compensations --  Postural Changes --      CHL IP OTHER RECOMMENDATIONS 03/09/2017  Recommended Consults --  Oral Care Recommendations --  Other Recommendations Order thickener from pharmacy;Prohibited food (jello, ice cream, thin soups);Remove water pitcher;Have oral suction available      CHL IP FOLLOW UP RECOMMENDATIONS 03/09/2017  Follow up Recommendations [facility] D[facility] 03/10/2017  Speech Therapy Frequency (ACUTE ONLY) min 3x  week  Treatment Duration 2 weeks           CHL IP ORAL PHASE 03/10/2017  Oral Phase Impaired  Oral - Pudding Teaspoon Weak  lingual manipulation;Decreased bolus cohesion  Oral - Pudding Cup --  Oral - Honey Teaspoon Weak lingual manipulation  Oral - Honey Cup Weak lingual manipulation  Oral - Nectar Teaspoon --  Oral - Nectar Cup Weak lingual manipulation  Oral - Nectar Straw --  Oral - Thin Teaspoon --  Oral - Thin Cup --  Oral - Thin Straw --  Oral - Puree --  Oral - Mech Soft --  Oral - Regular --  Oral - Multi-Consistency --  Oral - Pill --  Oral Phase - Comment Slow, weak    CHL IP PHARYNGEAL PHASE 03/10/2017  Pharyngeal Phase Impaired  Pharyngeal- Pudding Teaspoon Delayed swallow initiation-vallecula;Reduced anterior laryngeal mobility;Reduced laryngeal elevation;Reduced tongue base retraction;Pharyngeal residue - valleculae;Pharyngeal residue - pyriform;Pharyngeal residue - posterior pharnyx  Pharyngeal --  Pharyngeal- Pudding Cup --  Pharyngeal --  Pharyngeal- Honey Teaspoon Delayed swallow initiation-vallecula;Reduced tongue base retraction;Reduced anterior laryngeal mobility;Reduced laryngeal elevation;Pharyngeal residue - valleculae;Pharyngeal residue - pyriform;Pharyngeal residue - posterior pharnyx  Pharyngeal --  Pharyngeal- Honey Cup --  Pharyngeal --  Pharyngeal- Nectar Teaspoon --  Pharyngeal --  Pharyngeal- Nectar Cup Delayed swallow initiation-vallecula;Reduced anterior laryngeal mobility;Reduced laryngeal elevation;Reduced tongue base retraction;Penetration/Aspiration during swallow;Moderate aspiration  Pharyngeal --  Pharyngeal- Nectar Straw --  Pharyngeal --  Pharyngeal- Thin Teaspoon --  Pharyngeal --  Pharyngeal- Thin Cup --  Pharyngeal --  Pharyngeal- Thin Straw --  Pharyngeal --  Pharyngeal- Puree --  Pharyngeal --  Pharyngeal- Mechanical Soft --  Pharyngeal --  Pharyngeal- Regular --  Pharyngeal --  Pharyngeal- Multi-consistency --  Pharyngeal --  Pharyngeal- Pill --  Pharyngeal --  Pharyngeal Comment --     No flowsheet data found.  No flowsheet data  found.   Alvin PrimroseSusan G Zaelynn Fuchs, MS/CCC- SLP  Tedd SiasAbernathy, Lynnell DikeSusie 03/10/2017, 1:20 PM

## 2017-03-10 NOTE — Care Management Important Message (Signed)
Important Message  Patient Details  Name: Alvin [name] MRN: [id number] Date of Birth: 06/17/1941   Medicare Important Message Given:  Yes    Gwenette GreetBrenda S Holliday Sheaffer, RN 03/10/2017, 9:04 AM

## 2017-03-10 NOTE — Progress Notes (Signed)
Nutrition Follow-up  DOCUMENTATION CODES:   Obesity unspecified  INTERVENTION:  Discontinue Pro-Stat.  Provide Magic cup TID with meals, each supplement provides 290 kcal and 9 grams of protein.  Provide Honey-Thick Mighty Shake TID with meals, each supplement provides 200 kcal, 7 grams of protein.  It is difficult to meet hydration needs on thickened liquids. Patient may benefit from IV fluids to help meet hydration needs as he is no longer on any form of nutrition support.  NUTRITION DIAGNOSIS:   Inadequate oral intake related to lethargy/confusion as evidenced by other (see comment) (per RN report).  Ongoing.  GOAL:   Patient will meet greater than or equal to 90% of their needs  Progressing.  MONITOR:   PO intake, Supplement acceptance, Diet advancement, Labs, Weight trends, I & O's  REASON FOR ASSESSMENT:   Ventilator    ASSESSMENT:   76 year old male with PMHx of HTN, bipolar disorder who presented with N/V for 3 days found to have ischemic small bowel with small bowel pneumatosis in left lower abdomen. Sent to ICU with SOB secondary to PNA requiring intubation. Pt s/p exploratory laparotomy without SBR on 7/25.  -On evening of 8/1 patient pulled out NGT. -Following SLP evaluation on 8/2 patient was advanced to full liquid diet with nectar-thick liquids. -Pt had MBS today. SLP recommends dysphagia 1 diet with honey-thick liquids. Diet order has not been updated to reflect this at this time.  Met with patient at bedside. Patient no longer wearing mitts. He was alert today and able to answer some questions, though he did get confused later in assessment. Patient reports he enjoyed his breakfast this morning. Noted he had finished v8, 1.5 containers thickened apple juice, most of his nectar-thick milk, 75% of his Magic Cup, and bites of other items on tray. Per RN he also had his Pro-Stat this morning. That is approximately 633 kcal and 30 grams of protein just this  morning.   Medications reviewed and include: Lasix 20 mg BID, Novolog 0-9 units TID, Novolog 0-5 units QHS, thiamine 100 mg IV.  Labs reviewed: CBG 135-223 past 24 hrs, Sodium 148, Chloride 114, BUN 26.  I/O 0700 8/2 to 0700 8/3: 2.975 L UOP (1.2 ml/kg/hr - adequate); one occurrence stool output  Weight trend: 104.3 kg on 8/3 (-8.5 kg from weight on 7/29; still +5.9 kg from weight on 7/25)  Discussed with RN.  Diet Order:  Diet full liquid Room service appropriate? Yes with Assist; Fluid consistency: Nectar Thick  Skin:  Wound (see comment) (closed incision abdomen)  Last BM:  03/10/2017 - type 7  Height:   Ht Readings from Last 1 Encounters:  03/01/17 _0  (1.727 m)    Weight:   Wt Readings from Last 1 Encounters:  03/10/17 230 lb (104.3 kg)    Ideal Body Weight:  70 kg  BMI:  Body mass index is 34.97 kg/m.  Estimated Nutritional Needs:   Kcal:  3545-6256 (MSJ x 1.2-1.4)  Protein:  118-138 grams (1.2-1.4 grams/kg)  Fluid:  1.8 L/day (25 ml/kg IBW)  EDUCATION NEEDS:   No education needs identified at this time  Willey Blade, MS, RD, LDN Pager: [phone] After Hours Pager: [phone]

## 2017-03-10 NOTE — Progress Notes (Addendum)
[facility] - [place] at [facility]   PATIENT NAME: Alvin [name]    MR#:  [id number]  DATE OF BIRTH:  09/22/1940  SUBJECTIVE:   Still confused, but more awake. He has some whitish sputum.  REVIEW OF SYSTEMS:    Review of Systems  Unable to perform ROS: Mental acuity    DRUG ALLERGIES:  No Known Allergies  VITALS:  Blood pressure (!) 143/69, pulse 77, temperature 98.3 F (36.8 C), temperature source Oral, resp. rate (!) 21, height 5\' 8"  (1.727 m), weight 230 lb (104.3 kg), SpO2 92 %.  PHYSICAL EXAMINATION:   Physical Exam  GENERAL:  76 y.o.-year-old patient lying in bed in NAD. EYES: Right Pupil is fixed and dilated and not responsive to light. No scleral icterus.  HEENT: Head atraumatic, normocephalic. NG tube in place.    NECK:  Supple, no jugular venous distention. No thyroid enlargement, no tenderness.  LUNGS: bilateral basilar crackles, no wheezing and rhonchi. No use of accessory muscles of respiration.  CARDIOVASCULAR: S1, S2 normal. No murmurs, rubs, or gallops.  ABDOMEN: Soft, nontender, nondistended. Bowel sounds Hypoactive but present. No organomegaly or mass. + mid-abdomen staples in place with no acute drainage.  EXTREMITIES: No cyanosis, clubbing, but Bilateral leg edema 2+.    NEUROLOGIC: Globally weak. PSYCHIATRIC: confused, but knows location and time. SKIN: No obvious rash, lesion, or ulcer.    LABORATORY PANEL:   CBC  Recent Labs Lab 03/09/17 0434  WBC 21.0*  HGB 12.7*  HCT 37.1*  PLT 196   ------------------------------------------------------------------------------------------------------------------  Chemistries   Recent Labs Lab 03/06/17 0421  03/10/17 0436  NA 149*  < > 148*  K 3.7  < > 3.6  CL 122*  < > 114*  CO2 23  < > 27  GLUCOSE 224*  < > 166*  BUN 42*  < > 26*  CREATININE 1.39*  < > 1.07  CALCIUM 7.7*  < > 8.0*  MG 2.2  < > 2.1  AST 47*  --   --   ALT 30  --   --   ALKPHOS 72  --   --   BILITOT 0.6   --   --   < > = values in this interval not displayed. ------------------------------------------------------------------------------------------------------------------  Cardiac Enzymes No results for input(s): TROPONINI in the last 168 hours. ------------------------------------------------------------------------------------------------------------------  RADIOLOGY:  Dg Abd 1 View  Result Date: 03/08/2017 CLINICAL DATA:  Feeding tube placement. EXAM: ABDOMEN - 1 VIEW COMPARISON:  03/08/2017.  03/08/2017 FINDINGS: Feeding tube noted with its tip projected over the stomach. No gastric distention. Distended loops of small bowel again noted. Surgical staples noted over the abdomen. Cardiac pacer wire noted over the right ventricle. Small left pleural effusion cannot be excluded . IMPRESSION: 1. Feeding tube noted with its tip projected over the stomach. No gastric distention. Distended loops of small bowel again noted. 2. Small left pleural effusion . Electronically Signed   By: Maisie Fushomas  Register   On: 03/08/2017 17:07   Dg Abd 1 View  Result Date: 03/08/2017 CLINICAL DATA:  Feeding tube placed EXAM: ABDOMEN - 1 VIEW COMPARISON:  818 FINDINGS: feeding tube with tip in the gastric antrum versus first portion duodenum . IMPRESSION: Feeding tube with tip in the gastric antrum/first portion duodenum. Stylet remains. Electronically Signed   By: Genevive BiStewart  Edmunds M.D.   On: 03/08/2017 16:32   Dg Abd 1 View  Result Date: 03/08/2017 CLINICAL DATA:  Abdominal pain EXAM: ABDOMEN - 1 VIEW COMPARISON:  March 07, 2017 FINDINGS: Feeding tube tip is in stomach. There is postoperative change with surgical clips just to the right of midline in the lower abdomen or pelvis. There are loops of borderline dilated small bowel. No air-fluid level. No free air evident. IMPRESSION: Suspect a degree of postoperative ileus. Obstruction not felt to be likely. No free air. Feeding tube tip in stomach. Electronically Signed   By:  Bretta BangWilliam  Woodruff III M.D.   On: 03/08/2017 14:11   Dg Chest Port 1 View  Result Date: 03/10/2017 CLINICAL DATA:  Shortness of breath, pacemaker, hypertension EXAM: PORTABLE CHEST 1 VIEW COMPARISON:  03/06/2017 FINDINGS: Left pacer remains in place, unchanged. Heart is borderline in size. There are low lung volumes with bibasilar atelectasis or infiltrates, similar prior study. No definite effusions or acute bony abnormality. IMPRESSION: Continued bibasilar atelectasis or infiltrates.  No real change. Electronically Signed   By: Charlett NoseKevin  Dover M.D.   On: 03/10/2017 12:21     ASSESSMENT AND PLAN:   76 year old male with past medical history of hypertension, bipolar disorder, who presented to the hospital due to abdominal pain, nausea or diarrhea and suspected to have ischemic bowel and status post exploratory laparotomy which was negative for ischemic bowel patient now is severe sepsis with septic shock secondary to pneumonia.  1. Severe sepsis with septic shock- secondary to pneumonia and suspected Aspiration Pneumonia.  - Improved and off vasopressors. Leukocytosis is better at 21,000. procalcitonin is down to 0.85 discoontinued Zosyn, follow up CBC.  2. Acute respiratory failure-secondary to pneumonia/ARDS. -Extubated and off O2 sats in the low 90s and clinically stable. Continue aggressive pulmonary toileting.  3. Abdominal pain/distention-patient is status post exploratory laparotomy showing no evidence of ischemic bowel. Off tube feedings and discontinued TPN. Abdominal x-ray show ileus. But the patient had bowel movement.  On full liquid diet, aspiration precaution.  4. Hypernatremia, improving with D5W. Discontinued D5W due to wheezing and leg edema. F/u Na.  Bilateral leg edema. Possible due to hypoalbuminemia and no acute CHF per Dr. Mariah MillingGollan.   Cardiomegaly EF 40%.  echocardiogram: MIld to moderate Systolic dysfunction and mild Diastolic dysfunction with mild MR. Continue lasix iv,  coreg and lisinopril, start ASA per Dr. Mariah MillingGollan.  5. Hypokalemia. give potassium supplement. Improved.  6. Elevated troponin-secondary to supply demand ischemia, No evidence of acute coronary syndrome.  7. Leukocytosis-secondary to sepsis  Discontinued. IV Zosyn and follow-up CBC.  8. Hx of Bipolar Disorder - cont. Depakote.   9. Hyperlipidemia - cont. Atorvastatin  10. Hx of Gout - cont. Allopurinol.   11. Delirium. Improved.  restarted his 15 mg of Zyprexa at night per Psych consult.   Discussed with Dr. Aleen CampiPiscoya and Dr. Mariah MillingGollan. All the records are reviewed and case discussed with Care Management/Social Worker. Management plans discussed with the patient, her daughter and they are in agreement.  CODE STATUS: DNR  DVT Prophylaxis: Hep SQ  TOTAL TIME TAKING CARE OF THIS PATIENT: 37 minutes.   POSSIBLE D/C unclear, DEPENDING ON CLINICAL CONDITION.   Shaune Pollackhen, Torrin Frein M.D on 03/10/2017 at 1:27 PM  Between 7am to 6pm - Pager - [phone]  After 6pm go to [email or web address] - Social research officer, governmentpassword EPAS [facility]  [facility] [place] [facility]  Office  [phone]  CC: Primary care physician; System, Pcp Not In

## 2017-03-10 NOTE — Clinical Social Work Note (Signed)
CSW is continue to follow for discharge planning. PT is recommending SNF. Will follow up for SNF placement. CSW will continue to follow.   Truddie HiddenSarah McNutly, MSW, LCSW  Clinical Social Worker  [phone]

## 2017-03-10 NOTE — Progress Notes (Signed)
PT Cancellation Note  Patient Details Name: Lorenda PeckJohn Folden MRN: [id number] DOB: 02/23/1941   Cancelled Treatment:    Reason Eval/Treat Not Completed: Fatigue/lethargy limiting ability to participate (Treatment session attempted.  Patient lethargic; unable to maintain alertness for participation with session.  Will re-attempt at later time/date as appropriate.)   Pier Bosher H. Manson PasseyBrown, PT, DPT, NCS 03/10/17, 9:34 PM [phone]

## 2017-03-10 NOTE — Progress Notes (Signed)
Pt. Scored a 4 on the RT protocol assessment. He has no wheezes and has  Not had a CXR. The nebulizer treatments are to be changed to Q6 prn for wheezes or SOB.

## 2017-03-10 NOTE — Progress Notes (Signed)
03/10/2017  Subjective: Patient was started on full liquid diet per speech therapy recommendations.  No further tube feeds or dobhoff tube as he kept pulling it out.  He has been more alert and cooperative.  Today, he is able to help with his feedings.  Continues having bowel function.  Vital signs: Temp:  [98.2 F (36.8 C)-98.6 F (37 C)] 98.3 F (36.8 C) (08/03 0326) Pulse Rate:  [75-77] 77 (08/03 0326) Resp:  [20-21] 21 (08/03 0326) BP: (125-143)/(69-78) 143/69 (08/03 0326) SpO2:  [92 %-95 %] 92 % (08/03 0326) Weight:  [104.3 kg (230 lb)] 104.3 kg (230 lb) (08/03 0326)   Intake/Output: 08/02 0701 - 08/03 0700 In: 154.5 [IV Piggyback:154.5] Out: 2976 [Urine:2975; Stool:1] Last BM Date: 03/10/17  Physical Exam: Constitutional: No acute distress Abdomen:  Soft, non-distended, non-tender to palpation.  Incision is clean, dry, intact with staples in place.  No evidence of infection.  Labs:   Recent Labs  03/08/17 0624 03/09/17 0434  WBC 25.8* 21.0*  HGB 12.8* 12.7*  HCT 36.7* 37.1*  PLT 198 196    Recent Labs  03/09/17 0433 03/10/17 0436  NA 145 148*  K 3.4* 3.6  CL 115* 114*  CO2 24 27  GLUCOSE 121* 166*  BUN 26* 26*  CREATININE 0.94 1.07  CALCIUM 7.6* 8.0*   No results for input(s): LABPROT, INR in the last 72 hours.  Imaging: Dg Chest Port 1 View  Result Date: 03/10/2017 CLINICAL DATA:  Shortness of breath, pacemaker, hypertension EXAM: PORTABLE CHEST 1 VIEW COMPARISON:  03/06/2017 FINDINGS: Left pacer remains in place, unchanged. Heart is borderline in size. There are low lung volumes with bibasilar atelectasis or infiltrates, similar prior study. No definite effusions or acute bony abnormality. IMPRESSION: Continued bibasilar atelectasis or infiltrates.  No real change. Electronically Signed   By: Charlett NoseKevin  Dover M.D.   On: 03/10/2017 12:21    Assessment/Plan: 76 yo male s/p exploratory laparotomy.  --Continue advancing diet per speech therapy  recommendations.  Watch for any signs of aspiration. --No further issues per surgical standpoint.  Should be able to remove staples prior to discharge and then follow up as outpatient to check on his wound healing.   Howie IllJose Luis Caytlin Better, MD [facility] ASCOM 7a-7p: [phone] ASCOM 7p-7a: [phone]

## 2017-03-11 LAB — CBC
HCT: 40.6 % (ref 40.0–52.0)
Hemoglobin: 13.6 g/dL (ref 13.0–18.0)
MCH: 33.6 pg (ref 26.0–34.0)
MCHC: 33.6 g/dL (ref 32.0–36.0)
MCV: 100.2 fL — ABNORMAL HIGH (ref 80.0–100.0)
PLATELETS: 203 10*3/uL (ref 150–440)
RBC: 4.05 MIL/uL — AB (ref 4.40–5.90)
RDW: 14.4 % (ref 11.5–14.5)
WBC: 19.7 10*3/uL — AB (ref 3.8–10.6)

## 2017-03-11 LAB — BASIC METABOLIC PANEL
Anion gap: 6 (ref 5–15)
BUN: 27 mg/dL — AB (ref 6–20)
CO2: 26 mmol/L (ref 22–32)
Calcium: 7.8 mg/dL — ABNORMAL LOW (ref 8.9–10.3)
Chloride: 112 mmol/L — ABNORMAL HIGH (ref 101–111)
Creatinine, Ser: 1.03 mg/dL (ref 0.61–1.24)
Glucose, Bld: 151 mg/dL — ABNORMAL HIGH (ref 65–99)
POTASSIUM: 3.9 mmol/L (ref 3.5–5.1)
SODIUM: 144 mmol/L (ref 135–145)

## 2017-03-11 LAB — GLUCOSE, CAPILLARY
GLUCOSE-CAPILLARY: 141 mg/dL — AB (ref 65–99)
GLUCOSE-CAPILLARY: 219 mg/dL — AB (ref 65–99)
GLUCOSE-CAPILLARY: 193 mg/dL — AB (ref 65–99)
Glucose-Capillary: 129 mg/dL — ABNORMAL HIGH (ref 65–99)

## 2017-03-11 MED ORDER — ALBUTEROL SULFATE (2.5 MG/3ML) 0.083% IN NEBU: 2.5000 mg | INHALATION_SOLUTION | RESPIRATORY_TRACT | Status: DC | PRN

## 2017-03-11 NOTE — Progress Notes (Addendum)
[facility] - [place] [facility]   PATIENT NAME: Lorenda PeckJohn Cotroneo    MR#:  [id number]  DATE OF BIRTH:  05/19/1941  SUBJECTIVE:   The patient is awake, but confused. He had bowel movement.  REVIEW OF SYSTEMS:    Review of Systems  Unable to perform ROS: Mental acuity    DRUG ALLERGIES:  No Known Allergies  VITALS:  Blood pressure 126/66, pulse 70, temperature 97.7 F (36.5 C), temperature source Oral, resp. rate 20, height 5\' 8"  (1.727 m), weight 222 lb (100.7 kg), SpO2 95 %.  PHYSICAL EXAMINATION:   Physical Exam  GENERAL:  76 y.o.-year-old patient lying in bed in NAD. EYES: Right Pupil is fixed and dilated and not responsive to light. No scleral icterus.  HEENT: Head atraumatic, normocephalic. NECK:  Supple, no jugular venous distention. No thyroid enlargement, no tenderness.  LUNGS: bilateral basilar crackles, no wheezing and rhonchi. No use of accessory muscles of respiration.  CARDIOVASCULAR: S1, S2 normal. No murmurs, rubs, or gallops.  ABDOMEN: Soft, nontender, nondistended. Bowel sounds Hypoactive but present. No organomegaly or mass. + mid-abdomen staples in place with no acute drainage. Mild distension. EXTREMITIES: No cyanosis, clubbing, but Bilateral leg edema 1+.    NEUROLOGIC: Globally weak. PSYCHIATRIC: confused, but knows location and time. SKIN: No obvious rash, lesion, or ulcer.    LABORATORY PANEL:   CBC  Recent Labs Lab 03/11/17 0615  WBC 19.7*  HGB 13.6  HCT 40.6  PLT 203   ------------------------------------------------------------------------------------------------------------------  Chemistries   Recent Labs Lab 03/06/17 0421  03/10/17 0436 03/11/17 0615  NA 149*  < > 148* 144  K 3.7  < > 3.6 3.9  CL 122*  < > 114* 112*  CO2 23  < > 27 26  GLUCOSE 224*  < > 166* 151*  BUN 42*  < > 26* 27*  CREATININE 1.39*  < > 1.07 1.03  CALCIUM 7.7*  < > 8.0* 7.8*  MG 2.2  < > 2.1  --   AST 47*  --   --   --   ALT 30  --    --   --   ALKPHOS 72  --   --   --   BILITOT 0.6  --   --   --   < > = values in this interval not displayed. ------------------------------------------------------------------------------------------------------------------  Cardiac Enzymes No results for input(s): TROPONINI in the last 168 hours. ------------------------------------------------------------------------------------------------------------------  RADIOLOGY:  Dg Chest Port 1 View  Result Date: 03/10/2017 CLINICAL DATA:  Shortness of breath, pacemaker, hypertension EXAM: PORTABLE CHEST 1 VIEW COMPARISON:  03/06/2017 FINDINGS: Left pacer remains in place, unchanged. Heart is borderline in size. There are low lung volumes with bibasilar atelectasis or infiltrates, similar prior study. No definite effusions or acute bony abnormality. IMPRESSION: Continued bibasilar atelectasis or infiltrates.  No real change. Electronically Signed   By: Charlett NoseKevin  Dover M.D.   On: 03/10/2017 12:21     ASSESSMENT AND PLAN:   76 year old male with past medical history of hypertension, bipolar disorder, who presented to the hospital due to abdominal pain, nausea or diarrhea and suspected to have ischemic bowel and status post exploratory laparotomy which was negative for ischemic bowel patient now is severe sepsis with septic shock secondary to pneumonia.  1. Severe sepsis with septic shock- secondary to pneumonia and suspected Aspiration Pneumonia.  - Improved and off vasopressors. Leukocytosis is better at 19,000. procalcitonin is down to 0.85 discontinued Zosyn, follow up CBC.  2. Acute  respiratory failure-secondary to pneumonia/ARDS. -Extubated and off O2 sats in the low 90s and clinically stable. Continue aggressive pulmonary toileting.  3. Abdominal pain/distention-patient is status post exploratory laparotomy showing no evidence of ischemic bowel. Off tube feedings and discontinued TPN. Abdominal x-ray show ileus. But the patient had bowel  movement.  Advanced to dysphagia diet, aspiration precaution.  4. Hypernatremia, improved with D5W. Discontinued D5W due to wheezing and leg edema.  Bilateral leg edema. Possible due to hypoalbuminemia and no acute CHF per Dr. Mariah MillingGollan.   Cardiomegaly EF 40%.  echocardiogram: MIld to moderate Systolic dysfunction and mild Diastolic dysfunction with mild MR. Continue lasix iv, coreg and lisinopril, start ASA per Dr. Mariah MillingGollan.  5. Hypokalemia. give potassium supplement. Improved.  6. Elevated troponin-secondary to supply demand ischemia, No evidence of acute coronary syndrome.  7. Leukocytosis-secondary to sepsis  Discontinued. IV Zosyn and follow-up CBC.  8. Hx of Bipolar Disorder - cont. Depakote.   9. Hyperlipidemia - cont. Atorvastatin  10. Hx of Gout - cont. Allopurinol.   11. Delirium. Improved.  restarted his 15 mg of Zyprexa at night per Psych consult.   PT evaluation suggested SNF. All the records are reviewed and case discussed with Care Management/Social Worker. Management plans discussed with the patient, her daughter and they are in agreement.  CODE STATUS: DNR  DVT Prophylaxis: Hep SQ  TOTAL TIME TAKING CARE OF THIS PATIENT: 33 minutes.   POSSIBLE D/C unclear, DEPENDING ON CLINICAL CONDITION.   Shaune Pollackhen, Rashan Patient M.D on 03/11/2017 at 1:08 PM  Between 7am to 6pm - Pager - [phone]  After 6pm go to [email or web address] - Social research officer, governmentpassword EPAS [email or web address]  [facility] [place] [facility]  Office  [phone]  CC: Primary care physician; System, Pcp Not In

## 2017-03-11 NOTE — Progress Notes (Signed)
Physical Therapy Treatment Patient Details Name: Alvin PeckJohn Derringer MRN: [id number] DOB: 01/03/1941 Today's Date: 03/11/2017    History of Present Illness presented to ER seconadry to SOB, respiratory distress; admitted wtih severe sepsis secondary to CAP, small bowel pneumonitis.  Intubated 7/25-7/29; status post emergent exploratory laparotomy 7/25.  Hospital course complicated by need for pressors, intermittent agitation/confusion and post-op ileus.    PT Comments    Pt is up to walk with PT short trip to chair and then did some there exercise to increase ROM to LE's mainly.  He is feeling somewhat tired and struggling to stand but was motivated to try to move more.  WIll continue acute therapy to progress gait now that he is working in that direction, and will transition to SNF as planned due to ongoing weakness and unsafe standing/gait balance.  Follow Up Recommendations  SNF     Equipment Recommendations       Recommendations for Other Services       Precautions / Restrictions Precautions Precautions: Fall Precaution Comments: Dysphagia I/nectar thick liquids Restrictions Weight Bearing Restrictions: No    Mobility  Bed Mobility Overal bed mobility: Needs Assistance Bed Mobility: Supine to Sit     Supine to sit: Min assist;Mod assist     General bed mobility comments: rails and trunk assist, then help to finish scooting to EOB  Transfers Overall transfer level: Needs assistance Equipment used: Rolling walker (2 wheeled);1 person hand held assist Transfers: Sit to/from Stand Sit to Stand: Mod assist         General transfer comment: helped to stand from elevated bed, transition hands to walker and cues for posture  Ambulation/Gait Ambulation/Gait assistance: Min assist Ambulation Distance (Feet): 5 Feet Assistive device: Rolling walker (2 wheeled);1 person hand held assist Gait Pattern/deviations: Step-to pattern;Wide base of support;Shuffle;Decreased stride  length Gait velocity: reduced Gait velocity interpretation: Below normal speed for age/gender General Gait Details: pt level of fatigue but agreed to get to chair, then wanted water   Stairs            Wheelchair Mobility    Modified Rankin (Stroke Patients Only)       Balance Overall balance assessment: Needs assistance Sitting-balance support: Feet supported Sitting balance-Leahy Scale: Fair     Standing balance support: Bilateral upper extremity supported Standing balance-Leahy Scale: Poor Standing balance comment: reliant on walker but able to assist pulling up to stand more upright                            Cognition Arousal/Alertness: Awake/alert Behavior During Therapy: WFL for tasks assessed/performed Overall Cognitive Status: Difficult to assess                                        Exercises General Exercises - Lower Extremity Ankle Circles/Pumps: AAROM;Both;5 reps Quad Sets: AROM;AAROM;Both;10 reps Hip ABduction/ADduction: AROM;AAROM;Both;10 reps    General Comments        Pertinent Vitals/Pain Pain Assessment: No/denies pain Faces Pain Scale: Hurts little more Pain Location: abdomen, generalized soreness Pain Intervention(s): Monitored during session;Premedicated before session;Repositioned    Home Living                      Prior Function            PT Goals (current goals can now be found  in the care plan section) Acute Rehab PT Goals Patient Stated Goal: none stated PT Goal Formulation: With patient Progress towards PT goals: Progressing toward goals    Frequency    Min 2X/week      PT Plan Current plan remains appropriate    Co-evaluation              AM-PAC PT "6 Clicks" Daily Activity  Outcome Measure  Difficulty turning over in bed (including adjusting bedclothes, sheets and blankets)?: Total Difficulty moving from lying on back to sitting on the side of the bed? :  Total Difficulty sitting down on and standing up from a chair with arms (e.g., wheelchair, bedside commode, etc,.)?: Total Help needed moving to and from a bed to chair (including a wheelchair)?: A Lot Help needed walking in hospital room?: A Lot Help needed climbing 3-5 steps with a railing? : A Lot 6 Click Score: 9    End of Session Equipment Utilized During Treatment: Gait belt Activity Tolerance: Patient tolerated treatment well;Patient limited by fatigue Patient left: in chair;with call bell/phone within reach;with chair alarm set Nurse Communication: Mobility status PT Visit Diagnosis: Muscle weakness (generalized) (M62.81);Difficulty in walking, not elsewhere classified (R26.2)     Time: [id number] PT Time Calculation (min) (ACUTE ONLY): 24 min  Charges:  $Gait Training: 8-22 mins $Therapeutic Exercise: 8-22 mins                    G Codes:  Functional Assessment Tool Used: AM-PAC 6 Clicks Basic Mobility    Ivar DrapeRuth E Jaqlyn Gruenhagen 03/11/2017, 12:04 PM   Samul Dadauth Jalyssa Fleisher, PT MS Acute Rehab Dept. Number: [facility] [phone] and [facility] [phone]

## 2017-03-11 NOTE — Clinical Social Work Note (Signed)
CSW began process for PASRR in [place] [facility]. The patient has a disorder that requires a QMHP to assess. PASRR is pending, and the patient cannot discharge to a SNF without PASRR. CSW will continue to follow.  Argentina PonderKaren Martha Kairee Kozma, MSW, Theresia MajorsLCSWA [phone]

## 2017-03-11 NOTE — Progress Notes (Deleted)
Pt left floor to go to ultrasound.

## 2017-03-11 NOTE — Progress Notes (Signed)
Speech Language Pathology Treatment: Dysphagia  Patient Details Name: Alvin PeckJohn [name] MRN: [id number] DOB: 05/24/1941 Today's Date: 03/11/2017 Time: 0819-0910 SLP Time Calculation (min) (ACUTE ONLY): 51 min  Assessment / Plan / Recommendation Clinical Impression  Pt seen today for toleration of diet post MBSS which revealed oral phase deficits and a delay in pharyngeal swallow initiation requiring need to modify diet to a Dysphagia level 1 w/ Honey consistency liquids. NSG staff assisting pt w/ eating this morning and w/ some purees such as yogurt and tsps of the Honey consistency liquids, pt continued to exhibit the delayed coughing. Upon attempting his meds crushed in puree, pt held the boluses even longer and upon attempting to swallow, he appeared to regurgitate somewhat and gag.  Pt was requesting a "cold Coke", so SLP thickened the soda to between Nectar-Honey consistency and fed self via Cup. Education given on need to follow aspiration precautions of single, small sips slowly. Pt consumed ~6 ozs of the soda thickened w/ NO overt s/s of aspiration noted AND timely A-P transfer w/ swallow - NO prolonged bolus management. Pt was then given trials of increased textured foods moistened w/ more timely oral phase management and clearing noted as well. Unsure if the increased textured foods were increasing Neuro awareness for more timely oral phase(?).  Discussed pt's presentation w/ NSG. Recommend trials of a Dysphagia level 2 diet (increased texture) w/ Honey consistency liquids at this time; aspiration precautions; monitoring at all meals for any s/s of aspiration for need to downgrade the diet/food again. ST services will f/u on Monday. NSG agreed and will monitor and updated MD.    HPI HPI: Pt is a 76 y.o. male [facility] with a known history of Bipolar disorder, hypertension presents to the emergency department for evaluation of respiratory distress.  Patient was in a usual state of health until 3-4  days ago when he describes the onset of abdominal pain, midepigastric and right lower quadrant associated with nausea, vomiting and diarrhea. He developed gradually worsening weakness secondary decreased by mouth intake. Today he became confused and acutely short of breath. Pt  lives in an assisted living facility and is independent with his activities of daily living. Due to pt's s/s, surgery was contacted for evaluation and management of small bowel ischemia which resulted in exploratory laparatomy. Pt was orally intubated on 03/01/17 d/t respiratory distress, now extubated. Pt had NG TFs(but tube came out) and TPN until now. Pt is awake, verbally attempting to talk at times w/ mumbled speech, however, pt often drifts off to sleep d/t frequent apnea-appaering moments (3-4x during a minute). Pt does attempt to follow commands.      SLP Plan  Continue with current plan of care;Goals updated       Recommendations  Diet recommendations: Dysphagia 2 (fine chop);Honey-thick liquid Liquids provided via: Cup;No straw Medication Administration: Crushed with puree (whole for those that need to be) Supervision: Patient able to self feed;Staff to assist with self feeding;Full supervision/cueing for compensatory strategies Compensations: Minimize environmental distractions;Slow rate;Small sips/bites;Lingual sweep for clearance of pocketing;Multiple dry swallows after each bite/sip;Follow solids with liquid Postural Changes and/or Swallow Maneuvers: Seated upright 90 degrees;Upright 30-60 min after meal                General recommendations:  (Dietician f/u) Oral Care Recommendations: Oral care BID;Staff/trained caregiver to provide oral care Follow up Recommendations: Skilled Nursing facility SLP Visit Diagnosis: Dysphagia, oropharyngeal phase (R13.12) Plan: Continue with current plan of care;Goals updated  GO                 Alvin SomKatherine Watson, MS, CCC-SLP [name],Alvin 03/11/2017,  9:36 AM

## 2017-03-12 LAB — GLUCOSE, CAPILLARY: GLUCOSE-CAPILLARY: 123 mg/dL — AB (ref 65–99)

## 2017-03-12 LAB — PROCALCITONIN: PROCALCITONIN: 0.49 ng/mL

## 2017-03-12 NOTE — Progress Notes (Signed)
03/12/2017  Subjective: Patient is 11 Days Post-Op s/p exploratory laparotomy.  Had to be in/out catheterized overnight for urinary retention.  Otherwise, currently on a dysphagia diet, and continues to have bowel function.  Vital signs: Temp:  [97.7 F (36.5 C)-97.8 F (36.6 C)] 97.7 F (36.5 C) (08/05 0520) Pulse Rate:  [68-72] 68 (08/05 0520) Resp:  [20] 20 (08/05 0520) BP: (107-128)/(68-69) 128/68 (08/05 0520) SpO2:  [95 %-97 %] 97 % (08/05 0520) Weight:  [88.5 kg (195 lb)] 88.5 kg (195 lb) (08/05 0531)   Intake/Output: 08/04 0701 - 08/05 0700 In: 240 [P.O.:240] Out: 3275 [Urine:3275] Last BM Date: 03/11/17  Physical Exam: Constitutional: No acute distress Abdomen:  Soft, nondistended, nontender to palpation.  Incision is clean,dry, intact with staples in place.  Staples have been removed and replaced by steri strips.  Labs:   Recent Labs  03/11/17 0615  WBC 19.7*  HGB 13.6  HCT 40.6  PLT 203    Recent Labs  03/10/17 0436 03/11/17 0615  NA 148* 144  K 3.6 3.9  CL 114* 112*  CO2 27 26  GLUCOSE 166* 151*  BUN 26* 27*  CREATININE 1.07 1.03  CALCIUM 8.0* 7.8*   No results for input(s): LABPROT, INR in the last 72 hours.  Imaging: No results found.  Assessment/Plan: 76 yo male s/p exploratory laparotomy.  --Staples removed and changed to steri strips without complications. --Continue diet as tolerated. --Surgery will sign off for now.  On discharge, patient can follow up with Dr. Satira MccallumJason Davis 1-2 weeks after discharge. --Please feel free to call or consult again if any questions or concerns.   Howie IllJose Luis Markitta Ausburn, MD [facility] ASCOM 7a-7p: [phone] ASCOM 7p-7a: [phone]

## 2017-03-12 NOTE — Progress Notes (Signed)
Patient reported inability to initiate urination. Bladder scan done at 0018 revealed a urine retention of .  Writer proceeded to initiate measures to help patient self void but failed. Straight In and out catheterization done using sterile measures, drained of urine. Patient verbalized relief from bladder pressure. Will continue to monitor. Kept safe and comfortable.

## 2017-03-12 NOTE — Progress Notes (Signed)
[facility] [place] [facility]   PATIENT NAME: Alvin [name]    MR#:  [id number]  DATE OF BIRTH:  08/01/1941  SUBJECTIVE:   The patient is drowsy and sleeping. Per RN, He had urinary retention after removal of Foley catheter. He got in and out catheters last night and this morning.  REVIEW OF SYSTEMS:    Review of Systems  Unable to perform ROS: Mental acuity    DRUG ALLERGIES:  No Known Allergies  VITALS:  Blood pressure 128/68, pulse 68, temperature 97.7 F (36.5 C), temperature source Oral, resp. rate 20, height 5\' 8"  (1.727 m), weight 195 lb (88.5 kg), SpO2 97 %.  PHYSICAL EXAMINATION:   Physical Exam  GENERAL:  76 y.o.-year-old patient lying in bed in NAD.Obese. EYES: Right Pupil is fixed and dilated and not responsive to light. No scleral icterus.  HEENT: Head atraumatic, normocephalic. NECK:  Supple, no jugular venous distention. No thyroid enlargement, no tenderness.  LUNGS: bilateral basilar crackles, no wheezing and rhonchi. No use of accessory muscles of respiration.  CARDIOVASCULAR: S1, S2 normal. No murmurs, rubs, or gallops.  ABDOMEN: Soft, nontender, nondistended. Bowel sounds Hypoactive but present. No organomegaly or mass. + mid-abdomen staples in place with no acute drainage. Mild distension. EXTREMITIES: No cyanosis, clubbing, but Bilateral leg edema 1+.    NEUROLOGIC: Globally weak. PSYCHIATRIC: confused, but knows location and time. SKIN: No obvious rash, lesion, or ulcer.    LABORATORY PANEL:   CBC  Recent Labs Lab 03/11/17 0615  WBC 19.7*  HGB 13.6  HCT 40.6  PLT 203   ------------------------------------------------------------------------------------------------------------------  Chemistries   Recent Labs Lab 03/06/17 0421  03/10/17 0436 03/11/17 0615  NA 149*  < > 148* 144  K 3.7  < > 3.6 3.9  CL 122*  < > 114* 112*  CO2 23  < > 27 26  GLUCOSE 224*  < > 166* 151*  BUN 42*  < > 26* 27*  CREATININE 1.39*  <  > 1.07 1.03  CALCIUM 7.7*  < > 8.0* 7.8*  MG 2.2  < > 2.1  --   AST 47*  --   --   --   ALT 30  --   --   --   ALKPHOS 72  --   --   --   BILITOT 0.6  --   --   --   < > = values in this interval not displayed. ------------------------------------------------------------------------------------------------------------------  Cardiac Enzymes No results for input(s): TROPONINI in the last 168 hours. ------------------------------------------------------------------------------------------------------------------  RADIOLOGY:  No results found.   ASSESSMENT AND PLAN:   76 year old male with past medical history of hypertension, bipolar disorder, who presented to the hospital due to abdominal pain, nausea or diarrhea and suspected to have ischemic bowel and status post exploratory laparotomy which was negative for ischemic bowel patient now is severe sepsis with septic shock secondary to pneumonia.  1. Severe sepsis with septic shock- secondary to pneumonia and suspected Aspiration Pneumonia.  - Improved and off vasopressors. Leukocytosis is better at 19,000. procalcitonin is down to 0.85 discontinued Zosyn, follow up CBC.  2. Acute respiratory failure-secondary to pneumonia/ARDS. -Extubated and off O2 sats in the low 90s and clinically stable. Continue aggressive pulmonary toileting.  3. Abdominal pain/distention-patient is status post exploratory laparotomy showing no evidence of ischemic bowel. Off tube feedings and discontinued TPN. Abdominal x-ray show ileus. But the patient had bowel movement.  Advanced to dysphagia diet, aspiration precaution. The surgical wound is  stable was removed by Dr. Aleen CampiPiscoya.  4. Hypernatremia, improved with D5W. Discontinued D5W due to wheezing and leg edema.  Bilateral leg edema. Possible due to hypoalbuminemia and no acute CHF per Dr. Mariah MillingGollan.   Cardiomegaly EF 40%.  echocardiogram: MIld to moderate Systolic dysfunction and mild Diastolic dysfunction  with mild MR. Continue lasix iv, coreg and lisinopril, started ASA per Dr. Mariah MillingGollan.  5. Hypokalemia. give potassium supplement. Improved.  6. Elevated troponin-secondary to supply demand ischemia, No evidence of acute coronary syndrome.  7. Leukocytosis-secondary to sepsis  Discontinued. IV Zosyn and follow-up CBC.  8. Hx of Bipolar Disorder - cont. Depakote.   9. Hyperlipidemia - cont. Atorvastatin  10. Hx of Gout - cont. Allopurinol.   11. Delirium. Improved.  restarted his 15 mg of Zyprexa at night per Psych consult.  Discussed with Dr. Aleen CampiPiscoya. PT evaluation suggested SNF. All the records are reviewed and case discussed with Care Management/Social Worker. Management plans discussed with the patient, her daughter and they are in agreement.  CODE STATUS: DNR  DVT Prophylaxis: Hep SQ  TOTAL TIME TAKING CARE OF THIS PATIENT: 28 minutes.   POSSIBLE D/C in 2 days, DEPENDING ON CLINICAL CONDITION.   Shaune Pollackhen, [place] M.D on 03/12/2017 at 12:36 PM  Between 7am to 6pm - Pager - [phone]  After 6pm go to [email or web address] - Social research officer, governmentpassword [email or web address] [email or web address]  [facility]  Office  [phone]  CC: Primary care physician; System, Pcp Not In

## 2017-03-13 LAB — BASIC METABOLIC PANEL
ANION GAP: 7 (ref 5–15)
BUN: 22 mg/dL — ABNORMAL HIGH (ref 6–20)
CHLORIDE: 110 mmol/L (ref 101–111)
CO2: 27 mmol/L (ref 22–32)
Calcium: 7.7 mg/dL — ABNORMAL LOW (ref 8.9–10.3)
Creatinine, Ser: 0.8 mg/dL (ref 0.61–1.24)
GFR calc Af Amer: >60 mL/min (ref >60)
GFR calc non Af Amer: >60 mL/min (ref >60)
Glucose, Bld: 138 mg/dL — ABNORMAL HIGH (ref 65–99)
POTASSIUM: 3.4 mmol/L — AB (ref 3.5–5.1)
SODIUM: 144 mmol/L (ref 135–145)

## 2017-03-13 LAB — CBC
HEMATOCRIT: 36.4 % — AB (ref 40.0–52.0)
HEMOGLOBIN: 12.3 g/dL — AB (ref 13.0–18.0)
MCH: 33.5 pg (ref 26.0–34.0)
MCHC: 33.9 g/dL (ref 32.0–36.0)
MCV: 98.8 fL (ref 80.0–100.0)
Platelets: 206 10*3/uL (ref 150–440)
RBC: 3.68 MIL/uL — AB (ref 4.40–5.90)
RDW: 14.2 % (ref 11.5–14.5)
WBC: 15.1 10*3/uL — ABNORMAL HIGH (ref 3.8–10.6)

## 2017-03-13 LAB — PREALBUMIN: PREALBUMIN: 11.8 mg/dL — AB (ref 18–38)

## 2017-03-13 LAB — GLUCOSE, CAPILLARY
GLUCOSE-CAPILLARY: 124 mg/dL — AB (ref 65–99)
GLUCOSE-CAPILLARY: 200 mg/dL — AB (ref 65–99)
GLUCOSE-CAPILLARY: 205 mg/dL — AB (ref 65–99)
Glucose-Capillary: 207 mg/dL — ABNORMAL HIGH (ref 65–99)

## 2017-03-13 LAB — TRIGLYCERIDES: TRIGLYCERIDES: 138 mg/dL (ref <150)

## 2017-03-13 MED ORDER — CARVEDILOL 6.25 MG PO TABS: 6.2500 mg | ORAL_TABLET | Freq: Two times a day (BID) | ORAL | Status: DC

## 2017-03-13 MED ORDER — LISINOPRIL 2.5 MG PO TABS: 2.5000 mg | ORAL_TABLET | Freq: Every day | ORAL | Status: DC

## 2017-03-13 MED ORDER — BISACODYL 5 MG PO TBEC: 5.0000 mg | DELAYED_RELEASE_TABLET | Freq: Every day | ORAL | 0 refills | Status: DC | PRN

## 2017-03-13 MED ORDER — POTASSIUM CHLORIDE 20 MEQ/15ML (10%) PO SOLN
40.0000 meq | Freq: Once | ORAL | Status: AC
  Administered 2017-03-13: 10:00:00 40 meq via ORAL
  Filled 2017-03-13: qty 30

## 2017-03-13 MED ORDER — ASPIRIN 81 MG PO CHEW: 81.0000 mg | CHEWABLE_TABLET | Freq: Every day | ORAL | Status: DC

## 2017-03-13 MED ORDER — FUROSEMIDE 20 MG PO TABS
20.0000 mg | ORAL_TABLET | Freq: Every day | ORAL | Status: DC
  Administered 2017-03-13 – 2017-03-17 (×5): 20 mg via ORAL
  Filled 2017-03-13 (×5): qty 1

## 2017-03-13 NOTE — Discharge Instructions (Signed)
Fall and aspiration precaution. dysphagia II diet, aspiration precaution

## 2017-03-13 NOTE — Progress Notes (Signed)
Clinical Child psychotherapistocial Worker (CSW) attempted to give bed offers to patient however he was asleep. CSW contacted patient's daughter Iris PertBobbette and presented bed offers. She chose [facility]. CSW explained to daughter that once PASARR is received then patient can D/C. [facility] a message making him aware of accepted bed offer.   Baker Hughes IncorporatedBailey Aras Albarran, LCSW [phone]

## 2017-03-13 NOTE — Clinical Social Work Placement (Signed)
   CLINICAL SOCIAL WORK PLACEMENT  NOTE  Date:  03/13/2017  Patient Details  Name: Lorenda PeckJohn Geurts MRN: [id number] Date of Birth: 04/08/1941  Clinical Social Work is seeking post-discharge placement for this patient at the Skilled  Nursing Facility level of care (*CSW will initial, date and re-position this form in  chart as items are completed):  Yes   Patient/family provided with [place] Clinical Social Work Department's list of facilities offering this level of care within the geographic area requested by the patient (or if unable, by the patient's family).  Yes   Patient/family informed of their freedom to choose among providers that offer the needed level of care, that participate in Medicare, Medicaid or managed care program needed by the patient, have an available bed and are willing to accept the patient.  Yes   Patient/family informed of [place]'s ownership interest in [facility] and [facility], as well as of the fact that they are under no obligation to receive care at these facilities.  PASRR submitted to [facility] on 03/12/17     PASRR number received on       Existing PASRR number confirmed on       FL2 transmitted to all facilities in geographic area requested by pt/family on 03/12/17     FL2 transmitted to all facilities within larger geographic area on       Patient informed that his/her managed care company has contracts with or will negotiate with certain facilities, including the following:        Yes   Patient/family informed of bed offers received.  Patient chooses bed at  ([facility] )     Physician recommends and patient chooses bed at      Patient to be transferred to   on  .  Patient to be transferred to facility by       Patient family notified on   of transfer.  Name of family member notified:        PHYSICIAN       Additional Comment:    _______________________________________________ Mylik Pro, Darleen CrockerBailey M, LCSW 03/13/2017, 4:16 PM

## 2017-03-13 NOTE — Care Management Important Message (Signed)
Important Message  Patient Details  Name: Alvin PeckJohn [name] MRN: [id number] Date of Birth: 11/13/1940   Medicare Important Message Given:  Yes    Gwenette GreetBrenda S Kermitt Harjo, RN 03/13/2017, 9:02 AM

## 2017-03-13 NOTE — Discharge Summary (Signed)
[facility] [place] [facility]   PATIENT NAME: Alvin [name]    MR#:  [id number]  DATE OF BIRTH:  July 17, 1941  DATE OF ADMISSION:  02/28/2017   ADMITTING PHYSICIAN: Tonye Royalty, DO  DATE OF DISCHARGE:  03/13/2017 PRIMARY CARE PHYSICIAN: System, Pcp Not In   ADMISSION DIAGNOSIS:  Lactic acidosis [E87.2] Abdominal distention [R14.0] Demand ischemia (HCC) [I24.8] Severe sepsis with septic shock (HCC) [A41.9, R65.21] Elevated lipase [R74.8] Acute respiratory failure with hypoxemia (HCC) [J96.01] Community acquired pneumonia of left lower lobe of lung (HCC) [J18.1] DISCHARGE DIAGNOSIS:  Principal Problem:   Subacute delirium Active Problems:   Severe sepsis with septic shock (HCC)   Acute respiratory failure with hypoxemia (HCC)   Abdominal distention   Lactic acidosis   Elevated lipase   Nonocclusive intestinal infarction (HCC)   Bipolar disorder (HCC)  SECONDARY DIAGNOSIS:   Past Medical History:  Diagnosis Date  . Bipolar disorder (HCC)   . BPH (benign prostatic hyperplasia)   . Cardiomyopathy (HCC)    a. 03/2017 Echo: EF 40%, mild diastolic dysfxn. Mild MR.  . Gout   . 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  . Hyperlipidemia   . Hypertension    HOSPITAL COURSE:   76 year old male with past medical history of hypertension, bipolar disorder, who presented to the hospital due to abdominal pain, nausea or diarrhea and suspected to have ischemic bowel and status post exploratory laparotomy which was negative for ischemic bowel patient now is severe sepsis with septic shock secondary to pneumonia.  1. Severe sepsis with septic shock- secondary to pneumonia and suspected Aspiration Pneumonia.  - Improved and off vasopressors. Leukocytosis down at 15,000. procalcitonin is down to 0.49 discontinued Zosyn.  2. Acute respiratory failure-secondary to pneumonia/ARDS. -Extubated and off O2 sats in the low 90s and clinically  stable. Continue aggressive pulmonary toileting.  3. Abdominal pain/distention-patient is status post exploratory laparotomy showing no evidence of ischemic bowel. Off tube feedings and discontinued TPN. Abdominal x-ray show ileus. But the patient had bowel movement.  Advanced to dysphagia diet, aspiration precaution. The surgical wound is staples were removed by Dr. Aleen Campi.  4. Hypernatremia, improved with D5W. Discontinued D5W due to wheezing and leg edema.  Bilateral leg edema. Possible due to hypoalbuminemia and no acute CHF per Dr. Mariah Milling. Improved with lasix.  Cardiomegaly EF 40%.  echocardiogram: MIld to moderate Systolic dysfunction and mild Diastolicdysfunction with mild MR. Treated with lasix iv, coreg and lisinopril, started ASA per Dr. Mariah Milling.  5. Hypokalemia. give potassium supplement. Improved.  6. Elevated troponin-secondary to supply demand ischemia, No evidence of acute coronary syndrome.  7. Leukocytosis-secondary to sepsis  Discontinued. IV Zosyn, improving.  8. Hx of Bipolar Disorder - cont. Depakote.   9. Hyperlipidemia - cont. Atorvastatin  10. Hx of Gout - cont. Allopurinol.   11. Delirium. Improved.  restarted his 15 mg of Zyprexa at night per Psych consult.   Hypokalemia. Given KCl.  PT evaluation suggested SNF.  DISCHARGE CONDITIONS:  Stable, discharge to SNF today. CONSULTS OBTAINED:  Treatment Team:  Audery Amel, MD DRUG ALLERGIES:  No Known Allergies DISCHARGE MEDICATIONS:   Allergies as of 03/13/2017   No Known Allergies     Medication List    TAKE these medications   allopurinol 300 MG tablet Commonly known as:  ZYLOPRIM Take 300 mg by mouth daily.   aspirin 81 MG chewable tablet Chew 1 tablet (81 mg total) by mouth daily.  atorvastatin 20 MG tablet Commonly known as:  LIPITOR Take 20 mg by mouth daily.   bisacodyl 5 MG EC tablet Commonly known as:  DULCOLAX Take 1 tablet (5 mg total) by mouth daily as  needed for moderate constipation.   carvedilol 6.25 MG tablet Commonly known as:  COREG Take 1 tablet (6.25 mg total) by mouth 2 (two) times daily with a meal.   cholecalciferol 1000 units tablet Commonly known as:  VITAMIN D Take 1,000 Units by mouth daily.   divalproex 250 MG 24 hr tablet Commonly known as:  DEPAKOTE ER Take 250 mg by mouth daily.   divalproex 500 MG 24 hr tablet Commonly known as:  DEPAKOTE ER Take 500 mg by mouth daily.   lanolin/mineral oil Lotn Apply 1 application topically as needed for dry skin.   lisinopril 2.5 MG tablet Commonly known as:  PRINIVIL,ZESTRIL Take 1 tablet (2.5 mg total) by mouth daily. What changed:  medication strength  how much to take   MELATONIN PO Take 3 mg by mouth at bedtime.   menthol-cetylpyridinium 3 MG lozenge Commonly known as:  CEPACOL Take 1 lozenge by mouth as needed for sore throat.   multivitamins ther. w/minerals Tabs tablet Take 1 tablet by mouth daily.   OLANZapine 15 MG tablet Commonly known as:  ZYPREXA Take 15 mg by mouth at bedtime. What changed:  Another medication with the same name was removed. Continue taking this medication, and follow the directions you see here.   tamsulosin 0.4 MG Caps capsule Commonly known as:  FLOMAX Take 0.4 mg by mouth daily.   thiamine 100 MG tablet Commonly known as:  VITAMIN B-1 Take 100 mg by mouth daily.   traZODone 50 MG tablet Commonly known as:  DESYREL Take 50 mg by mouth at bedtime.   vitamin B-12 1000 MCG tablet Commonly known as:  CYANOCOBALAMIN Take 1,000 mcg by mouth daily.        DISCHARGE INSTRUCTIONS:  See AVS.  If you experience worsening of your admission symptoms, develop shortness of breath, life threatening emergency, suicidal or homicidal thoughts you must seek medical attention immediately by calling 911 or calling your MD immediately  if symptoms less severe.  You Must read complete instructions/literature along with all the  possible adverse reactions/side effects for all the Medicines you take and that have been prescribed to you. Take any new Medicines after you have completely understood and accpet all the possible adverse reactions/side effects.   Please note  You were cared for by a hospitalist during your hospital stay. If you have any questions about your discharge medications or the care you received while you were in the hospital after you are discharged, you can call the unit and asked to speak with the hospitalist on call if the hospitalist that took care of you is not available. Once you are discharged, your primary care physician will handle any further medical issues. Please note that NO REFILLS for any discharge medications will be authorized once you are discharged, as it is imperative that you return to your primary care physician (or establish a relationship with a primary care physician if you do not have one) for your aftercare needs so that they can reassess your need for medications and monitor your lab values.    On the day of Discharge:  VITAL SIGNS:  Blood pressure (!) 122/52, pulse 74, temperature 98 F (36.7 C), temperature source Axillary, resp. rate (!) 24, height 5\' 8"  (1.727 m), weight 198  lb (89.8 kg), SpO2 96 %. PHYSICAL EXAMINATION:  GENERAL:  76 y.o.-year-old patient lying in the bed with no acute distress.  EYES: Pupils equal, round, reactive to light and accommodation. No scleral icterus. Extraocular muscles intact.  HEENT: Head atraumatic, normocephalic. Oropharynx and nasopharynx clear.  NECK:  Supple, no jugular venous distention. No thyroid enlargement, no tenderness.  LUNGS: Normal breath sounds bilaterally, no wheezing, rales,rhonchi or crepitation. No use of accessory muscles of respiration.  CARDIOVASCULAR: S1, S2 normal. No murmurs, rubs, or gallops.  ABDOMEN: Soft, non-tender, non-distended. Bowel sounds present. No organomegaly or mass.  EXTREMITIES: No pedal edema,  cyanosis, or clubbing.  NEUROLOGIC: Cranial nerves II through XII are intact. Muscle strength 3-4/5 in all extremities. Sensation intact. Gait not checked.  PSYCHIATRIC: The patient is alert and oriented x 3.  SKIN: No obvious rash, lesion, or ulcer.  DATA REVIEW:   CBC  Recent Labs Lab 03/13/17 0459  WBC 15.1*  HGB 12.3*  HCT 36.4*  PLT 206    Chemistries   Recent Labs Lab 03/10/17 0436  03/13/17 0459  NA 148*  < > 144  K 3.6  < > 3.4*  CL 114*  < > 110  CO2 27  < > 27  GLUCOSE 166*  < > 138*  BUN 26*  < > 22*  CREATININE 1.07  < > 0.80  CALCIUM 8.0*  < > 7.7*  MG 2.1  --   --   < > = values in this interval not displayed.   Microbiology Results  Results for orders placed or performed during the hospital encounter of 02/28/17  Blood Culture (routine x 2)     Status: Abnormal   Collection Time: 02/28/17  9:58 PM  Result Value Ref Range Status   Specimen Description BLOOD BLOOD RIGHT HAND  Final   Special Requests   Final    BOTTLES DRAWN AEROBIC AND ANAEROBIC Blood Culture results may not be optimal due to an inadequate volume of blood received in culture bottles   Culture  Setup Time   Final    GRAM POSITIVE COCCI AEROBIC BOTTLE ONLY CRITICAL RESULT CALLED TO, READ BACK BY AND VERIFIED WITH: HANK ZOMPA AT 1123 ON 03/02/17 [facility].    Culture STAPHYLOCOCCUS SPECIES (COAGULASE NEGATIVE) (A)  Final   Report Status 03/04/2017 FINAL  Final   Organism ID, Bacteria STAPHYLOCOCCUS SPECIES (COAGULASE NEGATIVE)  Final      Susceptibility   Staphylococcus species (coagulase negative) - MIC*    CIPROFLOXACIN <=0.5 SENSITIVE Sensitive     ERYTHROMYCIN <=0.25 SENSITIVE Sensitive     GENTAMICIN <=0.5 SENSITIVE Sensitive     OXACILLIN <=0.25 SENSITIVE Sensitive     TETRACYCLINE <=1 SENSITIVE Sensitive     VANCOMYCIN <=0.5 SENSITIVE Sensitive     TRIMETH/SULFA <=10 SENSITIVE Sensitive     CLINDAMYCIN <=0.25 SENSITIVE Sensitive     RIFAMPIN <=0.5 SENSITIVE Sensitive      Inducible Clindamycin NEGATIVE Sensitive     * STAPHYLOCOCCUS SPECIES (COAGULASE NEGATIVE)  Blood Culture (routine x 2)     Status: Abnormal   Collection Time: 02/28/17  9:58 PM  Result Value Ref Range Status   Specimen Description BLOOD LEFT ANTECUBITAL  Final   Special Requests   Final    BOTTLES DRAWN AEROBIC AND ANAEROBIC Blood Culture adequate volume   Culture  Setup Time   Final    GRAM POSITIVE COCCI ANAEROBIC BOTTLE ONLY CRITICAL VALUE NOTED.  VALUE IS CONSISTENT WITH PREVIOUSLY REPORTED AND CALLED VALUE.  Culture (A)  Final    STAPHYLOCOCCUS SPECIES (COAGULASE NEGATIVE) SUSCEPTIBILITIES PERFORMED ON PREVIOUS CULTURE WITHIN THE LAST 5 DAYS. Performed at [facility], [address], [place], [place] [address]    Report Status 03/04/2017 FINAL  Final  Urine culture     Status: None   Collection Time: 02/28/17  9:58 PM  Result Value Ref Range Status   Specimen Description URINE, RANDOM  Final   Special Requests NONE  Final   Culture   Final    NO GROWTH Performed at [facility], [address], [place], [place] [address]    Report Status 03/02/2017 FINAL  Final  Blood Culture ID Panel (Reflexed)     Status: Abnormal   Collection Time: 02/28/17  9:58 PM  Result Value Ref Range Status   Enterococcus species NOT DETECTED NOT DETECTED Final   Listeria monocytogenes NOT DETECTED NOT DETECTED Final   Staphylococcus species DETECTED (A) NOT DETECTED Final    Comment: Methicillin (oxacillin) susceptible coagulase negative staphylococcus. Possible blood culture contaminant (unless isolated from more than one blood culture draw or clinical case suggests pathogenicity). No antibiotic treatment is indicated for blood  culture contaminants. CRITICAL RESULT CALLED TO, READ BACK BY AND VERIFIED WITH: HANK ZOMPA AT 1123 ON 03/02/17 [facility].    Staphylococcus aureus NOT DETECTED NOT DETECTED Final   Methicillin resistance NOT DETECTED NOT DETECTED Final   Streptococcus  species NOT DETECTED NOT DETECTED Final   Streptococcus agalactiae NOT DETECTED NOT DETECTED Final   Streptococcus pneumoniae NOT DETECTED NOT DETECTED Final   Streptococcus pyogenes NOT DETECTED NOT DETECTED Final   Acinetobacter baumannii NOT DETECTED NOT DETECTED Final   Enterobacteriaceae species NOT DETECTED NOT DETECTED Final   Enterobacter cloacae complex NOT DETECTED NOT DETECTED Final   Escherichia coli NOT DETECTED NOT DETECTED Final   Klebsiella oxytoca NOT DETECTED NOT DETECTED Final   Klebsiella pneumoniae NOT DETECTED NOT DETECTED Final   Proteus species NOT DETECTED NOT DETECTED Final   Serratia marcescens NOT DETECTED NOT DETECTED Final   Haemophilus influenzae NOT DETECTED NOT DETECTED Final   Neisseria meningitidis NOT DETECTED NOT DETECTED Final   Pseudomonas aeruginosa NOT DETECTED NOT DETECTED Final   Candida albicans NOT DETECTED NOT DETECTED Final   Candida glabrata NOT DETECTED NOT DETECTED Final   Candida krusei NOT DETECTED NOT DETECTED Final   Candida parapsilosis NOT DETECTED NOT DETECTED Final   Candida tropicalis NOT DETECTED NOT DETECTED Final  MRSA PCR Screening     Status: None   Collection Time: 03/01/17  2:08 AM  Result Value Ref Range Status   MRSA by PCR NEGATIVE NEGATIVE Final    Comment:        The GeneXpert MRSA Assay (FDA approved for NASAL specimens only), is one component of a comprehensive MRSA colonization surveillance program. It is not intended to diagnose MRSA infection nor to guide or monitor treatment for MRSA infections.   Culture, respiratory (NON-Expectorated)     Status: None   Collection Time: 03/03/17  6:30 PM  Result Value Ref Range Status   Specimen Description TRACHEAL ASPIRATE  Final   Special Requests NONE  Final   Gram Stain   Final    ABUNDANT WBC PRESENT, PREDOMINANTLY PMN RARE SQUAMOUS EPITHELIAL CELLS PRESENT NO ORGANISMS SEEN    Culture   Final    Consistent with normal respiratory flora. Performed at  [facility], [address] [place], [place] [address]    Report Status  03/06/2017 FINAL  Final  C difficile quick scan w PCR reflex     Status: None   Collection Time: 03/10/17  4:26 AM  Result Value Ref Range Status   C Diff antigen NEGATIVE NEGATIVE Final   C Diff toxin NEGATIVE NEGATIVE Final   C Diff interpretation No C. difficile detected.  Final    RADIOLOGY:  No results found.   Management plans discussed with the patient, family and they are in agreement.  CODE STATUS: DNR   TOTAL TIME TAKING CARE OF THIS PATIENT: 35 minutes.    Shaune Pollack M.D on 03/13/2017 at 8:58 AM  Between 7am to 6pm - Pager - [phone]  After 6pm go to [email or web address] - Social research officer, government  [facility] [place] [facility]  Office  [phone]  CC: Primary care physician; System, Pcp Not In   Note: This dictation was prepared with Dragon dictation along with smaller phrase technology. Any transcriptional errors that result from this process are unintentional.

## 2017-03-13 NOTE — Progress Notes (Signed)
Bladder scan showed 496mls. Per order in and out cath was performed. Patient tolerated well, 475mls drained from bladder. Otilio JeffersonMadelyn S Fenton, RN

## 2017-03-13 NOTE — Clinical Social Work Note (Signed)
At time of note, PASARR is pending. MD updated. CSW will continue to follow.   Dede QuerySarah Symantha Steeber, MSW, LCSW  Clinical Social Worker  [phone]

## 2017-03-13 NOTE — [place] FL2 (Signed)
[place] MEDICAID FL2 LEVEL OF CARE SCREENING TOOL     IDENTIFICATION  Patient Name: Alvin PeckJohn [name] Birthdate: 04/15/1941 Sex: male Admission Date (Current Location): 02/28/2017  [place]   Facility and Address:  [facility], [address], [place], [place] [address]      Provider Number: [id number]  Attending Physician Name and Address:  Shaune Pollackhen, Leatrice Parilla, MD  Relative Name and Phone Number:       Current Level of Care: Hospital Recommended Level of Care: Skilled Nursing Facility Prior Approval Number:    Date Approved/Denied:   PASRR Number:    Discharge Plan: SNF    Current Diagnoses: Patient Active Problem List   Diagnosis Date Noted  . Subacute delirium 03/07/2017  . Bipolar disorder (HCC) 03/07/2017  . Nonocclusive intestinal infarction (HCC)   . Acute respiratory failure with hypoxemia (HCC)   . Abdominal distention   . Lactic acidosis   . Elevated lipase   . Severe sepsis with septic shock (HCC) 02/28/2017    Orientation RESPIRATION BLADDER Height & Weight     Self, Situation, Place, Time  Normal Continent Weight: 198 lb (89.8 kg) Height:  5\' 8"  (172.7 cm)  BEHAVIORAL SYMPTOMS/MOOD NEUROLOGICAL BOWEL NUTRITION STATUS      Continent Diet (Diet recommendations: Dysphagia 2 (fine chop);Honey-thick liquidMedication Administration: Crushed with puree (whole for those that need to be))  AMBULATORY STATUS COMMUNICATION OF NEEDS Skin   Extensive Assist Verbally Normal                       Personal Care Assistance Level of Assistance  Bathing, Feeding, Dressing Bathing Assistance: Limited assistance Feeding assistance: Independent Dressing Assistance: Limited assistance     Functional Limitations Info             SPECIAL CARE FACTORS FREQUENCY  PT (By licensed PT)     PT Frequency: Up to 5 X per day, 5 days per week              Contractures Contractures Info: Present    Additional  Factors Info  Code Status, Allergies Code Status Info: DNR Allergies Info: NKA           Current Medications (03/13/2017):  This is the current hospital active medication list Current Facility-Administered Medications  Medication Dose Route Frequency Provider Last Rate Last Dose  . acetaminophen (TYLENOL) tablet 650 mg  650 mg Oral Q6H PRN Hugelmeyer, Alexis, DO       Or  . acetaminophen (TYLENOL) suppository 650 mg  650 mg Rectal Q6H PRN Hugelmeyer, Alexis, DO      . albuterol (PROVENTIL) (2.5 MG/3ML) 0.083% nebulizer solution 2.5 mg  2.5 mg Nebulization Q4H PRN Shaune Pollackhen, Sharrie Self, MD      . aspirin chewable tablet 81 mg  81 mg Oral Daily Shaune Pollackhen, Sylvester Minton, MD   81 mg at 03/13/17 0935  . bisacodyl (DULCOLAX) EC tablet 5 mg  5 mg Oral Daily PRN Hugelmeyer, Alexis, DO   5 mg at 03/03/17 0916  . carvedilol (COREG) tablet 6.25 mg  6.25 mg Oral BID WC Nicolasa DuckingBerge, Christopher R, NP   6.25 mg at 03/13/17 0754  . dextromethorphan (DELSYM) 30 MG/5ML liquid 30 mg  30 mg Oral BID Shaune Pollackhen, Ariez Neilan, MD   30 mg at 03/13/17 0935  . divalproex (DEPAKOTE SPRINKLE) capsule 250 mg  250 mg Oral q morning - 10a Clapacs, Jackquline DenmarkJohn T, MD   250 mg at 03/13/17 0936  .  divalproex (DEPAKOTE SPRINKLE) capsule 500 mg  500 mg Oral QHS Clapacs, Graylen T, MD   500 mg at 03/12/17 2046  . fentaNYL (SUBLIMAZE) injection 25 mcg  25 mcg Intravenous Q2H PRN Merwyn Katos, MD      . furosemide (LASIX) tablet 20 mg  20 mg Oral Daily Shaune Pollack, MD   20 mg at 03/13/17 0935  . guaiFENesin-dextromethorphan (ROBITUSSIN DM) 100-10 MG/5ML syrup 5 mL  5 mL Oral Q4H PRN Shaune Pollack, MD      . haloperidol lactate (HALDOL) injection 2.5 mg  2.5 mg Intravenous Q6H PRN Houston Siren, MD      . heparin injection 5,000 Units  5,000 Units Subcutaneous Q8H Shane Crutch, MD   5,000 Units at 03/13/17 0539  . lisinopril (PRINIVIL,ZESTRIL) tablet 2.5 mg  2.5 mg Oral Daily Shaune Pollack, MD   2.5 mg at 03/12/17 0930  . metoCLOPramide (REGLAN) injection 10 mg  10 mg  Intravenous Q8H PRN Shaune Pollack, MD   10 mg at 03/08/17 1140  . OLANZapine zydis (ZYPREXA) disintegrating tablet 15 mg  15 mg Oral QHS Clapacs, Jackquline Denmark, MD   15 mg at 03/12/17 2046  . ondansetron (ZOFRAN) injection 4 mg  4 mg Intravenous Q6H PRN Hugelmeyer, Alexis, DO      . oxyCODONE-acetaminophen (PERCOCET/ROXICET) 5-325 MG per tablet 1 tablet  1 tablet Per Tube Q6H PRN Shaune Pollack, MD   1 tablet at 03/08/17 1140  . senna-docusate (Senokot-S) tablet 1 tablet  1 tablet Oral QHS PRN Hugelmeyer, Alexis, DO      . sodium chloride flush (NS) 0.9 % injection 3 mL  3 mL Intravenous Q12H Hugelmeyer, Alexis, DO   3 mL at 03/13/17 1000     Discharge Medications: Please see discharge summary for a list of discharge medications.  Relevant Imaging Results:  Relevant Lab Results:   Additional Information SS# [id number]  Dede Query, LCSW

## 2017-03-13 NOTE — Progress Notes (Signed)
  Speech Language Pathology Treatment: Dysphagia  Patient Details Name: Alvin PeckJohn [name] MRN: [id number] DOB: 09/08/1940 Today's Date: 03/13/2017 Time: 1030-1100 SLP Time Calculation (min) (ACUTE ONLY): 30 min  Assessment / Plan / Recommendation Clinical Impression  Pt seen today for f/u w/ toleration of dysphagia diet.   HPI HPI: Pt is a 76 y.o. male [facility] with a known history of Bipolar disorder, hypertension presents to the emergency department for evaluation of respiratory distress.  Patient was in a usual state of health until 3-4 days ago when he describes the onset of abdominal pain, midepigastric and right lower quadrant associated with nausea, vomiting and diarrhea. He developed gradually worsening weakness secondary decreased by mouth intake. Today he became confused and acutely short of breath. Pt  lives in an assisted living facility and is independent with his activities of daily living. Due to pt's s/s, surgery was contacted for evaluation and management of small bowel ischemia which resulted in exploratory laparatomy. Pt was orally intubated on 03/01/17 d/t respiratory distress, now extubated. Pt had NG TFs(but tube came out) and TPN until now. Pt is awake, verbally attempting to talk at times w/ mumbled speech, however, pt often drifts off to sleep d/t frequent apnea-appaering moments (3-4x during a minute). Pt does attempt to follow commands.      SLP Plan  Continue with current plan of care;Goals updated Dysphagia therapy      Recommendations  Diet recommendations: Dysphagia 2 (fine chop);Honey-thick liquid Liquids provided via: Cup Medication Administration: Crushed with puree Supervision: Patient able to self feed;Staff to assist with self feeding;Full supervision/cueing for compensatory strategies Compensations: Minimize environmental distractions;Slow rate;Small sips/bites;Lingual sweep for clearance of pocketing;Multiple dry swallows after each bite/sip;Follow solids with  liquid Postural Changes and/or Swallow Maneuvers: Seated upright 90 degrees;Upright 30-60 min after meal                General recommendations:  (Dietician f/u) Oral Care Recommendations: Oral care BID;Staff/trained caregiver to provide oral care Follow up Recommendations: Skilled Nursing facility SLP Visit Diagnosis: Dysphagia, oropharyngeal phase (R13.12) Plan: Continue with current plan of care;Goals updated       GO                Alvin SomKatherine Sherlyne Crownover, MS, CCC-SLP Alvin [name] 03/13/2017, 3:44 PM

## 2017-03-14 MED ORDER — TAMSULOSIN HCL 0.4 MG PO CAPS
0.4000 mg | ORAL_CAPSULE | Freq: Every day | ORAL | Status: DC
  Administered 2017-03-14 – 2017-03-17 (×4): 0.4 mg via ORAL
  Filled 2017-03-14 (×4): qty 1

## 2017-03-14 NOTE — Progress Notes (Signed)
Physical Therapy Treatment Patient Details Name: Alvin PeckJohn [name] MRN: [id number] DOB: 09/27/1940 Today's Date: 03/14/2017    History of Present Illness presented to ER seconadry to SOB, respiratory distress; admitted wtih severe sepsis secondary to CAP, small bowel pneumonitis.  Intubated 7/25-7/29; status post emergent exploratory laparotomy 7/25.  Hospital course complicated by need for pressors, intermittent agitation/confusion and post-op ileus.    PT Comments    Pt agreeable to PT; denies pain. Pt wishes repositioned in bed and able to do so with verbal/tactile cues for sequence and Min A. Pt participates with supine bed exercises and becomes increasingly fatigued, falling asleep at end of session. Assist as needed for exercises, primarily tactile cueing with instruction. Continue PT to progress strength and endurance to improve bed mobility function to assist with overall care.  Pt has placement at skilled nursing facility.   Follow Up Recommendations  SNF     Equipment Recommendations       Recommendations for Other Services       Precautions / Restrictions Precautions Precautions: Fall Restrictions Weight Bearing Restrictions: No    Mobility  Bed Mobility Overal bed mobility: Needs Assistance Bed Mobility:  (repositioning upward in bed)           General bed mobility comments: Requires Min A and instruction on sequence for repositioning  Transfers                    Ambulation/Gait                 Stairs            Wheelchair Mobility    Modified Rankin (Stroke Patients Only)       Balance                                            Cognition Arousal/Alertness: Awake/alert (becomes lethargic during session, falling asleep) Behavior During Therapy: WFL for tasks assessed/performed;Flat affect Overall Cognitive Status: No family/caregiver present to determine baseline cognitive functioning                                         Exercises General Exercises - Lower Extremity Ankle Circles/Pumps: AROM;Both;10 reps;Supine;AAROM (increased cueing, tactile cues) Quad Sets: Strengthening;Both;10 reps;Supine Gluteal Sets: Strengthening;Both;10 reps;Supine Short Arc Quad: AROM;Both;10 reps;Supine Heel Slides: AROM;AAROM;10 reps;Both;Supine (assist for improved range) Hip ABduction/ADduction: AROM;Both;10 reps;Supine Straight Leg Raises: Strengthening;Both;10 reps;Supine    General Comments        Pertinent Vitals/Pain Pain Assessment: No/denies pain    Home Living                      Prior Function            PT Goals (current goals can now be found in the care plan section) Progress towards PT goals: Progressing toward goals    Frequency    Min 2X/week      PT Plan Current plan remains appropriate    Co-evaluation              AM-PAC PT "6 Clicks" Daily Activity  Outcome Measure  Difficulty turning over in bed (including adjusting bedclothes, sheets and blankets)?: Total Difficulty moving from lying on back to sitting on the side of the bed? :  Total Difficulty sitting down on and standing up from a chair with arms (e.g., wheelchair, bedside commode, etc,.)?: Total Help needed moving to and from a bed to chair (including a wheelchair)?: A Lot Help needed walking in hospital room?: A Lot Help needed climbing 3-5 steps with a railing? : A Lot 6 Click Score: 9    End of Session   Activity Tolerance: Patient limited by lethargy Patient left: in bed;with call bell/phone within reach;with bed alarm set   PT Visit Diagnosis: Muscle weakness (generalized) (M62.81);Difficulty in walking, not elsewhere classified (R26.2)     Time: 1610-9604 PT Time Calculation (min) (ACUTE ONLY): 16 min  Charges:  $Therapeutic Exercise: 8-22 mins                    G Codes:        Scot Dock, PTA 03/14/2017, 2:39 PM

## 2017-03-14 NOTE — Consult Note (Signed)
[facility] Consult   Reason for Consult:  Consult for 76 year old man with a history of bipolar disorder currently confused and agitated Referring Physician:  Verdell Carmine Patient Identification: Alvin [name] MRN:  [id number] Principal Diagnosis: Subacute delirium Diagnosis:   Patient Active Problem List   Diagnosis Date Noted  . Subacute delirium [F05] 03/07/2017  . Bipolar disorder ([place]) [F31.9] 03/07/2017  . 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 Acute respiratory failure with hypoxemia ([place]) [J96.01]   . Abdominal distention [R14.0]   . Lactic acidosis [E87.2]   . Elevated lipase [R74.8]   . Severe sepsis with septic shock ([place]) [A41.9, R65.21] 02/28/2017    Total Time spent with patient: 20 minutes  Subjective:   Alvin [name] is a 76 y.o. male patient admitted with "get these off me".  Follow-up for this patient with a history of bipolar disorder currently delirious recovering from serious medical problems. Patient was awake. He was still not able to speak in a lucid manner or answer direct questions or follow any commands. He was not agitated did not appear to be violent or trying to crawl out of bed.  Follow-up Thursday, August 2 for patient with history of bipolar disorder. Today I got to speak with the patient and his daughter who was present. He is clearly better than yesterday. The NG tube is out in the mittens are off. Patient was still not able to carry on a full conversation but was able to answer a few questions. He is starting to eat a little bit today.  Follow-up for Tuesday the seventh. Patient no new complaints. Gradually improving mental state. Now able to hold a conversation fairly lucidly.  HPI:  Patient interviewed chart reviewed. 76 year old man with a reported past history of bipolar disorder was admitted to the hospital from the [facility] with acute illness including abdominal pain. Patient had exploratory laparotomy which was unrevealing  but has subsequently developed pneumonia. Patient is now out of the intensive care unit but remains confused and agitated. Has pulled out lines and is requiring mittens and special precautions for safety. Patient was interviewed but was unable to give any information. He repeats himself frequently and could not answer questions lucidly. Judging from the past history prior to admission the patient was thought to be a lucid state of mind and was fairly independent at the [facility]. Currently he is receiving nutrition by an NG tube and is not able to get his oral medicines as well as previously.  Social history: Patient lives at the [facility]. Adult children are involved making decisions. He also has a history of being a veteran but apparently the family has preferred not to go to the [facility].  Medical history: Patient is recovering from pneumonia and sepsis among other medical problems  Substance abuse history: No acute or recent substance abuse issues    Past Psychiatric History: Reported history of bipolar disorder and was on modest dose of Depakote and olanzapine prior to admission. Was described as having been psychiatrically stable. Evidently has had treatment at the [facility] in the past. Unclear when and if ever he has had a last hospitalization unknown if he is ever had suicide attempts  Risk to Self: Is patient at risk for suicide?: No Risk to Others:   Prior Inpatient Therapy:   Prior Outpatient Therapy:    Past Medical History:  Past Medical History:  Diagnosis Date  . Bipolar disorder ([place])   . BPH (benign prostatic hyperplasia)   .  Cardiomyopathy ([place])    a. 03/2017 Echo: EF 29%, mild diastolic dysfxn. Mild MR.  . Gout   . 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  . Hyperlipidemia   . Hypertension     Past Surgical History:  Procedure Laterality Date  . LAPAROTOMY N/A 03/01/2017   Procedure: EXPLORATORY LAPAROTOMY;  Surgeon: Vickie Epley, MD;   Location: [facility] ORS;  Service: General;  Laterality: N/A;   Family History: History reviewed. No pertinent family history. Family Psychiatric  History: Unknown Social History:  History  Alcohol Use No     History  Drug Use No    Social History   Social History  . Marital status: Divorced    Spouse name: N/A  . Number of children: N/A  . Years of education: N/A   Social History Main Topics  . Smoking status: Never Smoker  . Smokeless tobacco: Never Used  . Alcohol use No  . Drug use: No  . Sexual activity: Not Asked   Other Topics Concern  . None   Social History Narrative   Pt says that he lives in [place] with his sister, though notes indicate that he lives in an assisted living facitliy.  He does not routinely exercise.   Additional Social History:    Allergies:  No Known Allergies  Labs:  Results for orders placed or performed during the hospital encounter of 02/28/17 (from the past 48 hour(s))  Triglycerides     Status: None   Collection Time: 03/13/17  4:59 AM  Result Value Ref Range   Triglycerides 138 <150 mg/dL  Prealbumin     Status: Abnormal   Collection Time: 03/13/17  4:59 AM  Result Value Ref Range   Prealbumin 11.8 (L) 18 - 38 mg/dL    Comment: Performed at [facility] [address], [place], [place] [address]  CBC     Status: Abnormal   Collection Time: 03/13/17  4:59 AM  Result Value Ref Range   WBC 15.1 (H) 3.8 - 10.6 K/uL   RBC 3.68 (L) 4.40 - 5.90 MIL/uL   Hemoglobin 12.3 (L) 13.0 - 18.0 g/dL   HCT 36.4 (L) 40.0 - 52.0 %   MCV 98.8 80.0 - 100.0 fL   MCH 33.5 26.0 - 34.0 pg   MCHC 33.9 32.0 - 36.0 g/dL   RDW 14.2 11.5 - 14.5 %   Platelets 206 150 - 440 K/uL  Basic metabolic panel     Status: Abnormal   Collection Time: 03/13/17  4:59 AM  Result Value Ref Range   Sodium 144 135 - 145 mmol/L   Potassium 3.4 (L) 3.5 - 5.1 mmol/L   Chloride 110 101 - 111 mmol/L   CO2 27 22 - 32 mmol/L   Glucose, Bld 138 (H) 65 - 99 mg/dL    BUN 22 (H) 6 - 20 mg/dL   Creatinine, Ser 0.80 0.61 - 1.24 mg/dL   Calcium 7.7 (L) 8.9 - 10.3 mg/dL   GFR calc non Af Amer >60 >60 mL/min   GFR calc Af Amer >60 >60 mL/min    Comment: (NOTE) The eGFR has been calculated using the CKD EPI equation. This calculation has not been validated in all clinical situations. eGFR's persistently <60 mL/min signify possible Chronic Kidney Disease.    Anion gap 7 5 - 15  Glucose, capillary     Status: Abnormal   Collection Time: 03/13/17  7:32 AM  Result Value Ref Range  Glucose-Capillary 124 (H) 65 - 99 mg/dL  Glucose, capillary     Status: Abnormal   Collection Time: 03/13/17 11:26 AM  Result Value Ref Range   Glucose-Capillary 200 (H) 65 - 99 mg/dL  Glucose, capillary     Status: Abnormal   Collection Time: 03/13/17  4:41 PM  Result Value Ref Range   Glucose-Capillary 205 (H) 65 - 99 mg/dL  Glucose, capillary     Status: Abnormal   Collection Time: 03/13/17  8:15 PM  Result Value Ref Range   Glucose-Capillary 207 (H) 65 - 99 mg/dL    Current Facility-Administered Medications  Medication Dose Route Frequency Provider Last Rate Last Dose  . acetaminophen (TYLENOL) tablet 650 mg  650 mg Oral Q6H PRN Hugelmeyer, Alexis, DO       Or  . acetaminophen (TYLENOL) suppository 650 mg  650 mg Rectal Q6H PRN Hugelmeyer, Alexis, DO      . albuterol (PROVENTIL) (2.5 MG/3ML) 0.083% nebulizer solution 2.5 mg  2.5 mg Nebulization Q4H PRN Demetrios Loll, MD      . aspirin chewable tablet 81 mg  81 mg Oral Daily Demetrios Loll, MD   81 mg at 03/14/17 1003  . bisacodyl (DULCOLAX) EC tablet 5 mg  5 mg Oral Daily PRN Hugelmeyer, Alexis, DO   5 mg at 03/03/17 0916  . carvedilol (COREG) tablet 6.25 mg  6.25 mg Oral BID WC Murray Hodgkins R, NP   6.25 mg at 03/14/17 1004  . dextromethorphan (DELSYM) 30 MG/5ML liquid 30 mg  30 mg Oral BID Demetrios Loll, MD   30 mg at 03/14/17 1007  . divalproex (DEPAKOTE SPRINKLE) capsule 250 mg  250 mg Oral q morning - 10a Omarr Hann, Shaymus  T, MD   250 mg at 03/14/17 1003  . divalproex (DEPAKOTE SPRINKLE) capsule 500 mg  500 mg Oral QHS Karol Liendo, Madie Reno, MD   500 mg at 03/13/17 2103  . fentaNYL (SUBLIMAZE) injection 25 mcg  25 mcg Intravenous Q2H PRN Wilhelmina Mcardle, MD      . furosemide (LASIX) tablet 20 mg  20 mg Oral Daily Demetrios Loll, MD   20 mg at 03/14/17 1009  . guaiFENesin-dextromethorphan (ROBITUSSIN DM) 100-10 MG/5ML syrup 5 mL  5 mL Oral Q4H PRN Demetrios Loll, MD      . haloperidol lactate (HALDOL) injection 2.5 mg  2.5 mg Intravenous Q6H PRN Henreitta Leber, MD      . heparin injection 5,000 Units  5,000 Units Subcutaneous Q8H Laverle Hobby, MD   5,000 Units at 03/14/17 0507  . lisinopril (PRINIVIL,ZESTRIL) tablet 2.5 mg  2.5 mg Oral Daily Demetrios Loll, MD   2.5 mg at 03/14/17 1004  . metoCLOPramide (REGLAN) injection 10 mg  10 mg Intravenous Q8H PRN Demetrios Loll, MD   10 mg at 03/08/17 1140  . OLANZapine zydis (ZYPREXA) disintegrating tablet 15 mg  15 mg Oral QHS Samie Barclift, Madie Reno, MD   15 mg at 03/13/17 2103  . ondansetron (ZOFRAN) injection 4 mg  4 mg Intravenous Q6H PRN Hugelmeyer, Alexis, DO      . oxyCODONE-acetaminophen (PERCOCET/ROXICET) 5-325 MG per tablet 1 tablet  1 tablet Per Tube Q6H PRN Demetrios Loll, MD   1 tablet at 03/08/17 1140  . senna-docusate (Senokot-S) tablet 1 tablet  1 tablet Oral QHS PRN Hugelmeyer, Alexis, DO      . sodium chloride flush (NS) 0.9 % injection 3 mL  3 mL Intravenous Q12H Hugelmeyer, Alexis, DO   3 mL at 03/13/17 2103  .  tamsulosin (FLOMAX) capsule 0.4 mg  0.4 mg Oral Daily Bettey Costa, MD   0.4 mg at 03/14/17 1038    Musculoskeletal: Strength & Muscle Tone: decreased Gait & Station: unable to stand Patient leans: N/A  Psychiatric Specialty Exam: Physical Exam  Nursing note and vitals reviewed. Constitutional: He appears well-developed and well-nourished. No distress.  HENT:  Head: Normocephalic and atraumatic.  Eyes: Pupils are equal, round, and reactive to light.  Conjunctivae are normal.  Neck: Normal range of motion.  Cardiovascular: Normal heart sounds.   Respiratory: Effort normal.  GI: Soft.  Musculoskeletal: Normal range of motion.  Neurological: He is alert.  Skin: Skin is warm and dry.  Psychiatric: He has a normal mood and affect. His affect is not labile. His speech is delayed. His speech is not tangential. He is not agitated. Cognition and memory are impaired. He does not express impulsivity. He expresses no homicidal and no suicidal ideation. He is communicative.    Review of Systems  Unable to perform ROS: Mental status change    Blood pressure 129/67, pulse 72, temperature 97.6 F (36.4 C), temperature source Oral, resp. rate 16, height 5' 8"  (1.727 m), weight 90.3 kg (199 lb), SpO2 94 %.Body mass index is 30.26 kg/m.  General Appearance: Disheveled  Eye Contact:  Minimal  Speech:  Garbled  Volume:  Decreased  Mood:  Dysphoric  Affect:  Congruent  Thought Process:  Disorganized  Orientation:  Negative  Thought Content:  Illogical and Rumination  Suicidal Thoughts:  No  Homicidal Thoughts:  No  Memory:  Negative  Judgement:  Negative  Insight:  Negative  Psychomotor Activity:  Restlessness  Concentration:  Concentration: Poor  Recall:  Poor  Fund of Knowledge:  Poor  Language:  Fair  Akathisia:  No  Handed:  Right  AIMS (if indicated):     Assets:  Financial Resources/Insurance Housing Social Support  ADL's:  Impaired  Cognition:  Impaired,  Moderate  Sleep:        Treatment Plan Summary: Daily contact with patient to assess and evaluate symptoms and progress in treatment, Medication management and Plan Continue current medicine. Recheck valproic acid level tomorrow morning.  Disposition: Patient does not meet criteria for psychiatric inpatient admission. Supportive therapy provided about ongoing stressors.  Alethia Berthold, MD 03/14/2017 5:06 PM

## 2017-03-14 NOTE — Progress Notes (Signed)
[employer] - [place] [facility]   PATIENT NAME: Alvin PeckJohn [name]    MR#:  [id number]  DATE OF BIRTH:  07/07/1941  SUBJECTIVE:   has urinary retention overnight  REVIEW OF SYSTEMS:    Review of Systems  Constitutional: Negative for fever, chills weight loss HENT: Negative for ear pain, nosebleeds, congestion, facial swelling, rhinorrhea, neck pain, neck stiffness and ear discharge.   Respiratory: Negative for cough, shortness of breath, wheezing  Cardiovascular: Negative for chest pain, palpitations and leg swelling.  Gastrointestinal: Negative for heartburn, abdominal pain, vomiting, diarrhea or consitpation Genitourinary: Negative for dysuria, urgency, frequency, hematuria Musculoskeletal: Negative for back pain or joint pain Neurological: Negative for dizziness, seizures, syncope, focal weakness,  numbness and headaches.  Hematological: Does not bruise/bleed easily.  Psychiatric/Behavioral: Negative for hallucinations, confusion, dysphoric mood    Tolerating Diet:yes      DRUG ALLERGIES:  No Known Allergies  VITALS:  Blood pressure 135/69, pulse 79, temperature 98.4 F (36.9 C), temperature source Oral, resp. rate 16, height 5\' 8"  (1.727 m), weight 90.3 kg (199 lb), SpO2 96 %.  PHYSICAL EXAMINATION:  Constitutional: Appears well-developed and well-nourished. No distress. HENT: Normocephalic. Marland Kitchen. Oropharynx is clear and moist.  Eyes: Conjunctivae and EOM are normal. PERRLA, no scleral icterus.  Neck: Normal ROM. Neck supple. No JVD. No tracheal deviation. CVS: RRR, S1/S2 +, no murmurs, no gallops, no carotid bruit.  Pulmonary: Effort and breath sounds normal, no stridor, rhonchi, wheezes, rales.  Abdominal: Soft. BS +,  no distension, tenderness, rebound or guarding.  Incision clean and intact staples removed Musculoskeletal: Normal range of motion. No edema and no tenderness.  Neuro: Alert. CN 2-12 grossly intact. No focal deficits. Skin: Skin is warm  and dry. No rash noted. Psychiatric: Normal mood and affect.      LABORATORY PANEL:   CBC  Recent Labs Lab 03/13/17 0459  WBC 15.1*  HGB 12.3*  HCT 36.4*  PLT 206   ------------------------------------------------------------------------------------------------------------------  Chemistries   Recent Labs Lab 03/10/17 0436  03/13/17 0459  NA 148*  < > 144  K 3.6  < > 3.4*  CL 114*  < > 110  CO2 27  < > 27  GLUCOSE 166*  < > 138*  BUN 26*  < > 22*  CREATININE 1.07  < > 0.80  CALCIUM 8.0*  < > 7.7*  MG 2.1  --   --   < > = values in this interval not displayed. ------------------------------------------------------------------------------------------------------------------  Cardiac Enzymes No results for input(s): TROPONINI in the last 168 hours. ------------------------------------------------------------------------------------------------------------------  RADIOLOGY:  No results found.   ASSESSMENT AND PLAN:   76 year old male with bipolar disorder who presented with severe sepsis and abdominal pain.   1. Severe sepsis with septic shock due to aspiration pneumonia: Patient symptoms have improved since admission. He has been off of pressors. And about except been discontinued  2. Acute hypoxic respiratory failure due to pneumonia and ARDS: Patient has been extubated and he is clinically stable. Continue aggressive pulmonary toileting.   3. Abdominal pain with distention: Patient is postoperative day #13 exploratory laparotomy  Showing no evidence of ischemic bowel Continue dysphagia diet with aspiration cautions Surgery consultation was appreciated.  4. Urinary retention: Start Flomax. In and out catheter as needed.  5. 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 Elevated troponin due to demand ischemia. Patient has been ruled out for acute coronary syndrome  7. Bipolar disorder: Continue Depakote Continue Coreg and  Lasix and lisinopril  Patient clinically and medically stable for discharge awaiting passr  Management plans discussed with the patient and he is in agreement.  CODE STATUS: DNR  TOTAL TIME TAKING CARE OF THIS PATIENT: 21 minutes.     POSSIBLE D/C tomorrow, DEPENDING ON PASSR  Hargun Spurling M.D on 03/14/2017 at 10:28 AM  Between 7am to 6pm - Pager - [phone] After 6pm go to [email or web address] - password [email or web address]  [facility]  Office  [phone]  CC: Primary care physician; [facility]  Note: This dictation was prepared with Dragon dictation along with smaller phrase technology. Any transcriptional errors that result from this process are unintentional.

## 2017-03-15 LAB — VALPROIC ACID LEVEL: VALPROIC ACID LVL: 14 ug/mL — AB (ref 50.0–100.0)

## 2017-03-15 NOTE — Progress Notes (Signed)
Nutrition Follow-up  Late entry for 03/15/2017 due to [facility] downtime.  DOCUMENTATION CODES:   Obesity unspecified  INTERVENTION:  Continue Magic cup TID with meals, each supplement provides 290 kcal and 9 grams of protein.  Continue Honey-Thick Mighty Shake TID with meals, each supplement provides 200 kcal, 7 grams of protein.  Since NGT was removed by patient on 8/1, he has been unable to meet his needs with PO intake. He is only taking bites/sips of meals. Mentation has improved. Counseled patient on importance of consuming adequate calories and protein. However, if patient unable to improve PO intake of meals/supplements, he will likely quickly progress to meeting criteria for malnutrition. Recommend close monitoring of intake where patient is discharging to as he may require further nutrition support in the future.  NUTRITION DIAGNOSIS:   Inadequate oral intake related to lethargy/confusion as evidenced by other (see comment) (per RN report).  Ongoing.  GOAL:   Patient will meet greater than or equal to 90% of their needs  Not met.  MONITOR:   PO intake, Supplement acceptance, Diet advancement, Labs, Weight trends, I & O's  REASON FOR ASSESSMENT:   Ventilator    ASSESSMENT:   76 year old male with PMHx of HTN, bipolar disorder who presented with N/V for 3 days found to have ischemic small bowel with small bowel pneumatosis in left lower abdomen. Sent to ICU with SOB secondary to PNA requiring intubation. Pt s/p exploratory laparotomy without SBR on 7/25.  -Pt has been having urinary retention. He was started on Flomax and in/out catheter Q8hrs. -Diet advanced to dysphagia 2 with honey-thick liquids on 8/4.  Spoke with patient at bedside. He reports his appetite is still poor. He does not like the food and is only having bites/sips of meals. He denies any N/V, abdominal pain. Encouraged patient to have protein portions of meals and Magic Cup and Mighty Shake to help meet  his calorie/protein needs.  Meal Completion: bites to 25%  Medications reviewed and include: Lasix 20 mg daily.  Labs reviewed. No chem profile or CBGs past 48 hrs.  Weight trend: 90 kg on 8/8 (-8.4 kg from admission weight)  Discussed with RN. Patient finished both of the honey-thick drinks on tray and about 75% of Magic Cup.  Diet Order:  DIET DYS 2 Room service appropriate? Yes with Assist; Fluid consistency: Honey Thick Diet - low sodium heart healthy  Skin:  Wound (see comment) (closed incision abdomen)  Last BM:  03/14/2017  Height:   Ht Readings from Last 1 Encounters:  03/01/17 5' 8"  (1.727 m)    Weight:   Wt Readings from Last 1 Encounters:  03/16/17 188 lb (85.3 kg)    Ideal Body Weight:  70 kg  BMI:  Body mass index is 28.59 kg/m.  Estimated Nutritional Needs:   Kcal:  4827-0786 (MSJ x 1.2-1.4)  Protein:  118-138 grams (1.2-1.4 grams/kg)  Fluid:  1.8 L/day (25 ml/kg IBW)  EDUCATION NEEDS:   No education needs identified at this time  Willey Blade, MS, RD, LDN Pager: [phone] After Hours Pager: [phone]

## 2017-03-15 NOTE — Discharge Summary (Addendum)
[facility] - [place] at [facility]   PATIENT NAME: Alvin [name]    MR#:  [id number]  DATE OF BIRTH:  Jul 20, 1941  DATE OF ADMISSION:  02/28/2017 ADMITTING PHYSICIAN: Tonye Royalty, DO  DATE OF DISCHARGE: 03/16/2017  PRIMARY CARE PHYSICIAN: [facility]    ADMISSION DIAGNOSIS:  Lactic acidosis [E87.2] Abdominal distention [R14.0] Demand ischemia (HCC) [I24.8] Severe sepsis with septic shock (HCC) [A41.9, R65.21] Elevated lipase [R74.8] Acute respiratory failure with hypoxemia (HCC) [J96.01] Community acquired pneumonia of left lower lobe of lung (HCC) [J18.1]  DISCHARGE DIAGNOSIS:  Principal Problem:   Subacute delirium Active Problems:   Severe sepsis with septic shock (HCC)   Acute respiratory failure with hypoxemia (HCC)   Abdominal distention   Lactic acidosis   Elevated lipase   Nonocclusive intestinal infarction (HCC)   Bipolar disorder (HCC)   SECONDARY DIAGNOSIS:   Past Medical History:  Diagnosis Date  . Bipolar disorder (HCC)   . BPH (benign prostatic hyperplasia)   . Cardiomyopathy (HCC)    a. 03/2017 Echo: EF 40%, mild diastolic dysfxn. Mild MR.  . Gout   . 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  . Hyperlipidemia   . Hypertension     HOSPITAL COURSE:  76 year old male with bipolar disorder who presented with severe sepsis and abdominal pain.   1. Severe sepsis with septic shock due to aspiration pneumonia: Patient symptoms have improved since admission. He has been off of pressors. And about except been discontinued  2. Acute hypoxic respiratory failure due to pneumonia and ARDS: Patient has been extubated and he is clinically stable. Continue aggressive pulmonary toileting.   3. Abdominal pain with distention: Patient is postoperative day #13 exploratory laparotomy  Showing no evidence of ischemic bowel Continue dysphagia diet with aspiration cautions Surgery consultation was  appreciated.  4. Urinary retention: Patient has been started on Flomax. He will need In and out catheter every 8 hours until he is able to void by himself.   5. 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 Elevated troponin due to demand ischemia. Patient has been ruled out for acute coronary syndrome  7. Bipolar disorder: Continue Depakote and zyprexa 8. Essential HTN: Continue Coreg and Lasix and lisinopril   DISCHARGE CONDITIONS AND DIET:  Stable for discharge on dysphagia 2 diet with honey thickened    CONSULTS OBTAINED:  Treatment Team:  Clapacs, Jackquline Denmark, MD  DRUG ALLERGIES:  No Known Allergies  DISCHARGE MEDICATIONS:   Current Discharge Medication List    START taking these medications   Details  aspirin 81 MG chewable tablet Chew 1 tablet (81 mg total) by mouth daily.    bisacodyl (DULCOLAX) 5 MG EC tablet Take 1 tablet (5 mg total) by mouth daily as needed for moderate constipation. Qty: 30 tablet, Refills: 0    carvedilol (COREG) 6.25 MG tablet Take 1 tablet (6.25 mg total) by mouth 2 (two) times daily with a meal.      CONTINUE these medications which have CHANGED   Details  lisinopril (PRINIVIL,ZESTRIL) 2.5 MG tablet Take 1 tablet (2.5 mg total) by mouth daily.      CONTINUE these medications which have NOT CHANGED   Details  allopurinol (ZYLOPRIM) 300 MG tablet Take 300 mg by mouth daily.    atorvastatin (LIPITOR) 20 MG tablet Take 20 mg by mouth daily.    cholecalciferol (VITAMIN D) 1000 units tablet Take 1,000 Units by mouth daily.    !!  divalproex (DEPAKOTE ER) 250 MG 24 hr tablet Take 250 mg by mouth daily.    !! divalproex (DEPAKOTE ER) 500 MG 24 hr tablet Take 500 mg by mouth daily.    lanolin/mineral oil (KERI/THERA-DERM) LOTN Apply 1 application topically as needed for dry skin.    MELATONIN PO Take 3 mg by mouth at bedtime.    menthol-cetylpyridinium (CEPACOL) 3 MG lozenge Take 1 lozenge by mouth as needed  for sore throat.    Multiple Vitamins-Minerals (MULTIVITAMINS THER. W/MINERALS) TABS tablet Take 1 tablet by mouth daily.    OLANZapine (ZYPREXA) 15 MG tablet Take 15 mg by mouth at bedtime.    tamsulosin (FLOMAX) 0.4 MG CAPS capsule Take 0.4 mg by mouth daily.    thiamine (VITAMIN B-1) 100 MG tablet Take 100 mg by mouth daily.    traZODone (DESYREL) 50 MG tablet Take 50 mg by mouth at bedtime.    vitamin B-12 (CYANOCOBALAMIN) 1000 MCG tablet Take 1,000 mcg by mouth daily.     !! - Potential duplicate medications found. Please discuss with provider.        Today   CHIEF COMPLAINT:  Patient with urinary retention overnight   VITAL SIGNS:  Blood pressure (!) 154/80, pulse 67, temperature (!) 97.5 F (36.4 C), temperature source Oral, resp. rate 16, height 5\' 8"  (1.727 m), weight 90 kg (198 lb 6.6 oz), SpO2 97 %.   REVIEW OF SYSTEMS:  Review of Systems  Constitutional: Negative.  Negative for chills, fever and malaise/fatigue.  HENT: Negative.  Negative for ear discharge, ear pain, hearing loss, nosebleeds and sore throat.   Eyes: Negative.  Negative for blurred vision and pain.  Respiratory: Negative.  Negative for cough, hemoptysis, shortness of breath and wheezing.   Cardiovascular: Negative.  Negative for chest pain, palpitations and leg swelling.  Gastrointestinal: Negative.  Negative for abdominal pain, blood in stool, diarrhea, nausea and vomiting.  Genitourinary: Negative for dysuria.       Urinary retention  Musculoskeletal: Negative.  Negative for back pain.  Skin: Negative.   Neurological: Negative for dizziness, tremors, speech change, focal weakness, seizures and headaches.  Endo/Heme/Allergies: Negative.  Does not bruise/bleed easily.  Psychiatric/Behavioral: Positive for depression. Negative for hallucinations and suicidal ideas.     PHYSICAL EXAMINATION:  GENERAL:  76 y.o.-year-old patient lying in the bed with no acute distress.  NECK:  Supple, no  jugular venous distention. No thyroid enlargement, no tenderness.  LUNGS: Normal breath sounds bilaterally, no wheezing, rales,rhonchi  No use of accessory muscles of respiration.  CARDIOVASCULAR: S1, S2 normal. No murmurs, rubs, or gallops.  ABDOMEN: Soft, non-tender, non-distended. Bowel sounds present. No organomegaly or mass.  EXTREMITIES: No pedal edema, cyanosis, or clubbing.  PSYCHIATRIC: The patient is alert and oriented x 3. Flat affect SKIN: No obvious rash, lesion, or ulcer.   DATA REVIEW:   CBC  Recent Labs Lab 03/13/17 0459  WBC 15.1*  HGB 12.3*  HCT 36.4*  PLT 206    Chemistries   Recent Labs Lab 03/10/17 0436  03/13/17 0459  NA 148*  < > 144  K 3.6  < > 3.4*  CL 114*  < > 110  CO2 27  < > 27  GLUCOSE 166*  < > 138*  BUN 26*  < > 22*  CREATININE 1.07  < > 0.80  CALCIUM 8.0*  < > 7.7*  MG 2.1  --   --   < > = values in this interval not displayed.  Cardiac  Enzymes No results for input(s): TROPONINI in the last 168 hours.  Microbiology Results  @MICRORSLT48 @  RADIOLOGY:  No results found.    Current Discharge Medication List    START taking these medications   Details  aspirin 81 MG chewable tablet Chew 1 tablet (81 mg total) by mouth daily.    bisacodyl (DULCOLAX) 5 MG EC tablet Take 1 tablet (5 mg total) by mouth daily as needed for moderate constipation. Qty: 30 tablet, Refills: 0    carvedilol (COREG) 6.25 MG tablet Take 1 tablet (6.25 mg total) by mouth 2 (two) times daily with a meal.      CONTINUE these medications which have CHANGED   Details  lisinopril (PRINIVIL,ZESTRIL) 2.5 MG tablet Take 1 tablet (2.5 mg total) by mouth daily.      CONTINUE these medications which have NOT CHANGED   Details  allopurinol (ZYLOPRIM) 300 MG tablet Take 300 mg by mouth daily.    atorvastatin (LIPITOR) 20 MG tablet Take 20 mg by mouth daily.    cholecalciferol (VITAMIN D) 1000 units tablet Take 1,000 Units by mouth daily.    !! divalproex  (DEPAKOTE ER) 250 MG 24 hr tablet Take 250 mg by mouth daily.    !! divalproex (DEPAKOTE ER) 500 MG 24 hr tablet Take 500 mg by mouth daily.    lanolin/mineral oil (KERI/THERA-DERM) LOTN Apply 1 application topically as needed for dry skin.    MELATONIN PO Take 3 mg by mouth at bedtime.    menthol-cetylpyridinium (CEPACOL) 3 MG lozenge Take 1 lozenge by mouth as needed for sore throat.    Multiple Vitamins-Minerals (MULTIVITAMINS THER. W/MINERALS) TABS tablet Take 1 tablet by mouth daily.    OLANZapine (ZYPREXA) 15 MG tablet Take 15 mg by mouth at bedtime.    tamsulosin (FLOMAX) 0.4 MG CAPS capsule Take 0.4 mg by mouth daily.    thiamine (VITAMIN B-1) 100 MG tablet Take 100 mg by mouth daily.    traZODone (DESYREL) 50 MG tablet Take 50 mg by mouth at bedtime.    vitamin B-12 (CYANOCOBALAMIN) 1000 MCG tablet Take 1,000 mcg by mouth daily.     !! - Potential duplicate medications found. Please discuss with provider.         Management plans discussed with the patient and he is in agreement. Stable for discharge   Patient should follow up with pcp  CODE STATUS:     Code Status Orders        Start     Ordered   03/02/17 1053  Do not attempt resuscitation (DNR)  Continuous    Question Answer Comment  In the event of cardiac or respiratory ARREST Do not call a "code blue"   In the event of cardiac or respiratory ARREST Do not perform Intubation, CPR, defibrillation or ACLS   In the event of cardiac or respiratory ARREST Use medication by any route, position, wound care, and other measures to relive pain and suffering. May use oxygen, suction and manual treatment of airway obstruction as needed for comfort.      03/02/17 1052    Code Status History    Date Active Date Inactive Code Status Order ID Comments User Context   03/01/2017  2:00 AM 03/02/2017 10:52 AM Full Code [id number]  Hugelmeyer, Jon Gills, DO Inpatient      TOTAL TIME TAKING CARE OF THIS PATIENT: 37 minutes.     Note: This dictation was prepared with Dragon dictation along with smaller phrase technology. Any transcriptional errors  that result from this process are unintentional.  Micaela Stith M.D on 03/15/2017 at 8:57 AM  Between 7am to 6pm - Pager - [phone] After 6pm go to [email or web address] - Social research officer, [email or web address] [email or web address]  [facility]  Office  [phone]  CC: Primary care physician; [facility]

## 2017-03-15 NOTE — Consult Note (Signed)
[facility] Face-to-Face Psychiatry Consult   Reason for Consult:  Consult for 76 year old man with a history of bipolar disorder currently confused and agitated Referring Physician:  Cherlynn Kaiser Patient Identification: Alvin [name] MRN:  [id number] Principal Diagnosis: Subacute delirium Diagnosis:   Patient Active Problem List   Diagnosis Date Noted  . Subacute delirium [F05] 03/07/2017  . Bipolar disorder (HCC) [F31.9] 03/07/2017  . Nonocclusive intestinal infarction (HCC) [K55.069]   . Acute respiratory failure with hypoxemia (HCC) [J96.01]   . Abdominal distention [R14.0]   . Lactic acidosis [E87.2]   . Elevated lipase [R74.8]   . Severe sepsis with septic shock (HCC) [A41.9, R65.21] 02/28/2017    Total Time spent with patient: 20 minutes  Subjective:   Alvin [name] is a 76 y.o. male patient admitted with "get these off me".  Follow-up for this patient with a history of bipolar disorder currently delirious recovering from serious medical problems. Patient was awake. He was still not able to speak in a lucid manner or answer direct questions or follow any commands. He was not agitated did not appear to be violent or trying to crawl out of bed.  Follow-up Thursday, August 2 for patient with history of bipolar disorder. Today I got to speak with the patient and his daughter who was present. He is clearly better than yesterday. The NG tube is out in the mittens are off. Patient was still not able to carry on a full conversation but was able to answer a few questions. He is starting to eat a little bit today.  Follow-up for Tuesday the seventh. Patient no new complaints. Gradually improving mental state. Now able to hold a conversation fairly lucidly.  Follow-up for Wednesday, August 8. Patient seen. He was awake and able to engage in conversation. Had a basic understanding of his situation. Able to answer questions. Affect blunted but he says he is able to feel a little bit of optimism about ending  his strength back. No sign at all of agitation or mania. The Depakote level came back this morning low at 14  HPI:  Patient interviewed chart reviewed. 76 year old man with a reported past history of bipolar disorder was admitted to the hospital from the [facility] with acute illness including abdominal pain. Patient had exploratory laparotomy which was unrevealing but has subsequently developed pneumonia. Patient is now out of the intensive care unit but remains confused and agitated. Has pulled out lines and is requiring mittens and special precautions for safety. Patient was interviewed but was unable to give any information. He repeats himself frequently and could not answer questions lucidly. Judging from the past history prior to admission the patient was thought to be a lucid state of mind and was fairly independent at the [place]. Currently he is receiving nutrition by an NG tube and is not able to get his oral medicines as well as previously.  Social history: Patient lives at the [facility]. Adult children are involved making decisions. He also has a history of being a veteran but apparently the family has preferred not to go to the [facility].  Medical history: Patient is recovering from pneumonia and sepsis among other medical problems  Substance abuse history: No acute or recent substance abuse issues    Past Psychiatric History: Reported history of bipolar disorder and was on modest dose of Depakote and olanzapine prior to admission. Was described as having been psychiatrically stable. Evidently has had treatment at the [facility] in the past. Unclear when and if ever  he has had a last hospitalization unknown if he is ever had suicide attempts  Risk to Self: Is patient at risk for suicide?: No Risk to Others:   Prior Inpatient Therapy:   Prior Outpatient Therapy:    Past Medical History:  Past Medical History:  Diagnosis Date  . Bipolar disorder (HCC)   . BPH (benign prostatic hyperplasia)    . Cardiomyopathy (HCC)    a. 03/2017 Echo: EF 40%, mild diastolic dysfxn. Mild MR.  . Gout   . 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  . Hyperlipidemia   . Hypertension     Past Surgical History:  Procedure Laterality Date  . LAPAROTOMY N/A 03/01/2017   Procedure: EXPLORATORY LAPAROTOMY;  Surgeon: Ancil Linsey, MD;  Location: [facility] ORS;  Service: General;  Laterality: N/A;   Family History: History reviewed. No pertinent family history. Family Psychiatric  History: Unknown Social History:  History  Alcohol Use No     History  Drug Use No    Social History   Social History  . Marital status: Divorced    Spouse name: N/A  . Number of children: N/A  . Years of education: N/A   Social History Main Topics  . Smoking status: Never Smoker  . Smokeless tobacco: Never Used  . Alcohol use No  . Drug use: No  . Sexual activity: Not Asked   Other Topics Concern  . None   Social History Narrative   Pt says that he lives in [place] with his sister, though notes indicate that he lives in an assisted living facitliy.  He does not routinely exercise.   Additional Social History:    Allergies:  No Known Allergies  Labs:  Results for orders placed or performed during the hospital encounter of 02/28/17 (from the past 48 hour(s))  Glucose, capillary     Status: Abnormal   Collection Time: 03/13/17  8:15 PM  Result Value Ref Range   Glucose-Capillary 207 (H) 65 - 99 mg/dL  Valproic acid level     Status: Abnormal   Collection Time: 03/15/17  3:32 AM  Result Value Ref Range   Valproic Acid Lvl 14 (L) 50.0 - 100.0 ug/mL    Current Facility-Administered Medications  Medication Dose Route Frequency Provider Last Rate Last Dose  . acetaminophen (TYLENOL) tablet 650 mg  650 mg Oral Q6H PRN Hugelmeyer, Alexis, DO       Or  . acetaminophen (TYLENOL) suppository 650 mg  650 mg Rectal Q6H PRN Hugelmeyer, Alexis, DO      . albuterol (PROVENTIL) (2.5  MG/3ML) 0.083% nebulizer solution 2.5 mg  2.5 mg Nebulization Q4H PRN Shaune Pollack, MD      . aspirin chewable tablet 81 mg  81 mg Oral Daily Shaune Pollack, MD   81 mg at 03/15/17 8295  . bisacodyl (DULCOLAX) EC tablet 5 mg  5 mg Oral Daily PRN Hugelmeyer, Alexis, DO   5 mg at 03/03/17 0916  . carvedilol (COREG) tablet 6.25 mg  6.25 mg Oral BID WC Nicolasa Ducking R, NP   6.25 mg at 03/15/17 0932  . dextromethorphan (DELSYM) 30 MG/5ML liquid 30 mg  30 mg Oral BID Shaune Pollack, MD   30 mg at 03/15/17 0933  . divalproex (DEPAKOTE SPRINKLE) capsule 250 mg  250 mg Oral q morning - 10a Clapacs, Jackquline Denmark, MD   250 mg at 03/15/17 0933  . divalproex (DEPAKOTE SPRINKLE) capsule 500 mg  500 mg Oral QHS  Clapacs, Jackquline Denmark, MD   500 mg at 03/14/17 2106  . fentaNYL (SUBLIMAZE) injection 25 mcg  25 mcg Intravenous Q2H PRN Merwyn Katos, MD      . furosemide (LASIX) tablet 20 mg  20 mg Oral Daily Shaune Pollack, MD   20 mg at 03/15/17 0932  . guaiFENesin-dextromethorphan (ROBITUSSIN DM) 100-10 MG/5ML syrup 5 mL  5 mL Oral Q4H PRN Shaune Pollack, MD      . haloperidol lactate (HALDOL) injection 2.5 mg  2.5 mg Intravenous Q6H PRN Houston Siren, MD      . heparin injection 5,000 Units  5,000 Units Subcutaneous Q8H Shane Crutch, MD   5,000 Units at 03/15/17 0531  . lisinopril (PRINIVIL,ZESTRIL) tablet 2.5 mg  2.5 mg Oral Daily Shaune Pollack, MD   2.5 mg at 03/15/17 0932  . metoCLOPramide (REGLAN) injection 10 mg  10 mg Intravenous Q8H PRN Shaune Pollack, MD   10 mg at 03/08/17 1140  . OLANZapine zydis (ZYPREXA) disintegrating tablet 15 mg  15 mg Oral QHS Clapacs, Jackquline Denmark, MD   15 mg at 03/14/17 2107  . ondansetron (ZOFRAN) injection 4 mg  4 mg Intravenous Q6H PRN Hugelmeyer, Alexis, DO      . oxyCODONE-acetaminophen (PERCOCET/ROXICET) 5-325 MG per tablet 1 tablet  1 tablet Per Tube Q6H PRN Shaune Pollack, MD   1 tablet at 03/08/17 1140  . senna-docusate (Senokot-S) tablet 1 tablet  1 tablet Oral QHS PRN Hugelmeyer, Alexis, DO       . sodium chloride flush (NS) 0.9 % injection 3 mL  3 mL Intravenous Q12H Hugelmeyer, Alexis, DO   3 mL at 03/14/17 2107  . tamsulosin (FLOMAX) capsule 0.4 mg  0.4 mg Oral Daily Adrian Saran, MD   0.4 mg at 03/15/17 1914    Musculoskeletal: Strength & Muscle Tone: decreased Gait & Station: unable to stand Patient leans: N/A  Psychiatric Specialty Exam: Physical Exam  Nursing note and vitals reviewed. Constitutional: He appears well-developed and well-nourished. No distress.  HENT:  Head: Normocephalic and atraumatic.  Eyes: Pupils are equal, round, and reactive to light. Conjunctivae are normal.  Neck: Normal range of motion.  Cardiovascular: Normal heart sounds.   Respiratory: Effort normal.  GI: Soft.  Musculoskeletal: Normal range of motion.  Neurological: He is alert.  Skin: Skin is warm and dry.  Psychiatric: He has a normal mood and affect. His affect is not labile. His speech is delayed. His speech is not tangential. He is not agitated. Cognition and memory are impaired. He does not express impulsivity. He expresses no homicidal and no suicidal ideation. He is communicative.    Review of Systems  Unable to perform ROS: Mental status change    Blood pressure 125/76, pulse 84, temperature 98.1 F (36.7 C), temperature source Oral, resp. rate 16, height 5\' 8"  (1.727 m), weight 90 kg (198 lb 6.6 oz), SpO2 98 %.Body mass index is 30.17 kg/m.  General Appearance: Disheveled  Eye Contact:  Minimal  Speech:  Garbled  Volume:  Decreased  Mood:  Dysphoric  Affect:  Congruent  Thought Process:  Coherent  Orientation:  Negative  Thought Content:  Illogical and Rumination  Suicidal Thoughts:  No  Homicidal Thoughts:  No  Memory:  Negative  Judgement:  Negative  Insight:  Negative  Psychomotor Activity:  Normal  Concentration:  Concentration: Poor  Recall:  Poor  Fund of Knowledge:  Poor  Language:  Fair  Akathisia:  No  Handed:  Right  AIMS (if  indicated):     Assets:   Financial Resources/Insurance Housing Social Support  ADL's:  Impaired  Cognition:  Impaired,  Moderate  Sleep:        Treatment Plan Summary: Daily contact with patient to assess and evaluate symptoms and progress in treatment, Medication management and Plan On review, I do not think I will change the dose of his Depakote. This is the same dose he had been taking previously. Depakote typically is most useful in treating manic symptoms which she is not currently having. I don't see any benefit to increasing the dose at this point. It would probably just over sedate him. Continue current Zyprexa. Counseled patient about the importance of rousing himself to be as energetic as possible. I will follow-up as needed but he may be getting ready for discharge.  Disposition: Patient does not meet criteria for psychiatric inpatient admission. Supportive therapy provided about ongoing stressors.  Mordecai RasmussenJohn Clapacs, MD 03/15/2017 5:15 PM

## 2017-03-15 NOTE — Progress Notes (Signed)
[facility] - [place] at [facility]   PATIENT NAME: Alvin [name]    MR#:  [id number]  DATE OF BIRTH:  1941-01-30  SUBJECTIVE:  Still with Urinary retention overnight  REVIEW OF SYSTEMS:    Review of Systems  Constitutional: Negative for fever, chills weight loss HENT: Negative for ear pain, nosebleeds, congestion, facial swelling, rhinorrhea, neck pain, neck stiffness and ear discharge.   Respiratory: Negative for cough, shortness of breath, wheezing  Cardiovascular: Negative for chest pain, palpitations and leg swelling.  Gastrointestinal: Negative for heartburn, abdominal pain, vomiting, diarrhea or consitpation Genitourinary: Negative for dysuria, urgency, frequency, hematuria Positive urinary retention Musculoskeletal: Negative for back pain or joint pain Neurological: Negative for dizziness, seizures, syncope, focal weakness,  numbness and headaches.  Hematological: Does not bruise/bleed easily.  Psychiatric/Behavioral: Negative for hallucinations, confusion, dysphoric mood    Tolerating Diet:yes      DRUG ALLERGIES:  No Known Allergies  VITALS:  Blood pressure (!) 154/80, pulse 67, temperature (!) 97.5 F (36.4 C), temperature source Oral, resp. rate 16, height 5\' 8"  (1.727 m), weight 90 kg (198 lb 6.6 oz), SpO2 97 %.  PHYSICAL EXAMINATION:  Constitutional: Appears well-developed and well-nourished. No distress. HENT: Normocephalic. Marland Kitchen Oropharynx is clear and moist.  Eyes: Conjunctivae and EOM are normal. PERRLA, no scleral icterus.  Neck: Normal ROM. Neck supple. No JVD. No tracheal deviation. CVS: RRR, S1/S2 +, no murmurs, no gallops, no carotid bruit.  Pulmonary: Effort and breath sounds normal, no stridor, rhonchi, wheezes, rales.  Abdominal: Soft. BS +,  no distension, tenderness, rebound or guarding.  Incision clean and intact staples removed Musculoskeletal: Normal range of motion. No edema and no tenderness.  Neuro: Alert. CN 2-12 grossly  intact. No focal deficits. Skin: Skin is warm and dry. No rash noted. Psychiatric: Normal mood and affect.      LABORATORY PANEL:   CBC  Recent Labs Lab 03/13/17 0459  WBC 15.1*  HGB 12.3*  HCT 36.4*  PLT 206   ------------------------------------------------------------------------------------------------------------------  Chemistries   Recent Labs Lab 03/10/17 0436  03/13/17 0459  NA 148*  < > 144  K 3.6  < > 3.4*  CL 114*  < > 110  CO2 27  < > 27  GLUCOSE 166*  < > 138*  BUN 26*  < > 22*  CREATININE 1.07  < > 0.80  CALCIUM 8.0*  < > 7.7*  MG 2.1  --   --   < > = values in this interval not displayed. ------------------------------------------------------------------------------------------------------------------  Cardiac Enzymes No results for input(s): TROPONINI in the last 168 hours. ------------------------------------------------------------------------------------------------------------------  RADIOLOGY:  No results found.   ASSESSMENT AND PLAN:   76 year old male with bipolar disorder who presented with severe sepsis and abdominal pain.   1. Severe sepsis with septic shock due to aspiration pneumonia: Patient symptoms have improved since admission. He has been off of pressors. And about except been discontinued  2. Acute hypoxic respiratory failure due to pneumonia and ARDS: Patient has been extubated and he is clinically stable. Continue aggressive pulmonary toileting.   3. Abdominal pain with distention: Patient is postoperative day #13 exploratory laparotomy  Showing no evidence of ischemic bowel Continue dysphagia diet with aspiration cautions Surgery consultation was appreciated.  4. Urinary retention: Patient has been started on Flomax. He will need In and out catheter every 8 hours until he is able to void by himself.   5. 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 Elevated  troponin due to demand  ischemia. Patient has been ruled out for acute coronary syndrome  7. Bipolar disorder: Continue Depakote Continue Coreg and Lasix and lisinopril  Patient clinically and medically stable for discharge awaiting passr  Management plans discussed with the patient and he is in agreement.  CODE STATUS: DNR  TOTAL TIME TAKING CARE OF THIS PATIENT: 20 minutes.     POSSIBLE D/C tomorrow, DEPENDING ON PASSR  Laylana Gerwig M.D on 03/15/2017 at 8:53 AM  Between 7am to 6pm - Pager - [phone] After 6pm go to [email or web address] - password [email or web address]  [facility] [place] [facility]  Office  [phone]  CC: Primary care physician; [facility], [facility]  Note: This dictation was prepared with Dragon dictation along with smaller phrase technology. Any transcriptional errors that result from this process are unintentional.

## 2017-03-15 NOTE — Progress Notes (Signed)
Physical Therapy Treatment Patient Details Name: Alvin PeckJohn [name] MRN: [id number] DOB: 07/24/1941 Today's Date: 03/15/2017    History of Present Illness Pt presented to ER seconadry to SOB, respiratory distress; admitted wtih severe sepsis secondary to CAP, small bowel pneumonitis.  Intubated 7/25-7/29; status post emergent exploratory laparotomy 7/25.  Hospital course complicated by need for pressors, intermittent agitation/confusion and post-op ileus.    PT Comments    Pt presents with deficits in strength, transfers, mobility, gait, balance, and activity tolerance although is progressing towards goals.  Pt education and practice provided on log roll technique with bed mobility with min-mod A to complete.  Pt c/o mod dizziness with change of position from sup to sitting at EOB.  Vitals taken in sitting with HR 87 bpm, SpO2 98%, and BP 125/76 mmHg.  Pt's BP in sitting WNL relative to earlier BP results taken in supine.  Pt able to stand from elevated EOB with CGA and was steady with no increase in symptoms.  Pt performed pre-gait weight shifting and marching in place without incident and was abel to amb forwards/backwards around 10' before requiring to return to sitting.  Pt will benefit from PT services in a SNF setting upon discharge to address above deficits for decreased caregiver assistance and safe return to PLOF.     Follow Up Recommendations  SNF     Equipment Recommendations       Recommendations for Other Services       Precautions / Restrictions Precautions Precautions: Fall Precaution Comments: Dysphagia I/nectar thick liquids Restrictions Weight Bearing Restrictions: No    Mobility  Bed Mobility Overal bed mobility: Needs Assistance Bed Mobility: Supine to Sit Rolling: Min assist   Supine to sit: Mod assist     General bed mobility comments: Log roll technique education/practice with mod verbal and tactile cues for sequencing and min-mod A to perform  task  Transfers Overall transfer level: Needs assistance Equipment used: Rolling walker (2 wheeled) Transfers: Sit to/from Stand Sit to Stand: Min guard         General transfer comment: Elevated EOB with mod verbal cues for sequencing; pt reported mod dizziness in sitting, HR, SpO2, and BP taken and all WNL  Ambulation/Gait Ambulation/Gait assistance: Min guard Ambulation Distance (Feet): 10 Feet Assistive device: Rolling walker (2 wheeled) Gait Pattern/deviations: Step-through pattern;Decreased step length - right;Decreased step length - left;Trunk flexed   Gait velocity interpretation: Below normal speed for age/gender General Gait Details: No significant change in pt's report of dizziness in standing vs sitting.  Pt steady with amb without LOB.    Stairs            Wheelchair Mobility    Modified Rankin (Stroke Patients Only)       Balance Overall balance assessment: Needs assistance Sitting-balance support: Feet supported;Bilateral upper extremity supported Sitting balance-Leahy Scale: Good     Standing balance support: Bilateral upper extremity supported Standing balance-Leahy Scale: Fair                              Cognition Arousal/Alertness: Awake/alert Behavior During Therapy: Flat affect;WFL for tasks assessed/performed Overall Cognitive Status: Within Functional Limits for tasks assessed                                        Exercises Total Joint Exercises Ankle Circles/Pumps: Strengthening;Both;5 reps;10 reps  Quad Sets: Strengthening;5 reps;10 reps;Both Heel Slides: AROM;Both;5 reps Hip ABduction/ADduction: AROM;Both;10 reps Straight Leg Raises: AAROM;Both;10 reps Long Arc Quad: AROM;Both;10 reps Marching in Standing: AROM;Both;5 reps;10 reps Other Exercises Other Exercises: Supine BLE leg press x 10 each with manual resistance    General Comments        Pertinent Vitals/Pain Pain Assessment: No/denies  pain    Home Living                      Prior Function            PT Goals (current goals can now be found in the care plan section) Progress towards PT goals: Progressing toward goals    Frequency    Min 2X/week      PT Plan Current plan remains appropriate    Co-evaluation              AM-PAC PT "6 Clicks" Daily Activity  Outcome Measure  Difficulty turning over in bed (including adjusting bedclothes, sheets and blankets)?: Total Difficulty moving from lying on back to sitting on the side of the bed? : Total Difficulty sitting down on and standing up from a chair with arms (e.g., wheelchair, bedside commode, etc,.)?: Total Help needed moving to and from a bed to chair (including a wheelchair)?: A Little Help needed walking in hospital room?: A Little Help needed climbing 3-5 steps with a railing? : A Lot 6 Click Score: 11    End of Session Equipment Utilized During Treatment: Gait belt Activity Tolerance: Patient tolerated treatment well Patient left: with nursing/sitter in room;Other (comment) (Pt left on BSC with CNA in room) Nurse Communication: Mobility status;Other (comment) (Vital sign response to change of position) PT Visit Diagnosis: Muscle weakness (generalized) (M62.81);Difficulty in walking, not elsewhere classified (R26.2)     Time: 1610-9604 PT Time Calculation (min) (ACUTE ONLY): 25 min  Charges:  $Therapeutic Exercise: 8-22 mins $Therapeutic Activity: 8-22 mins                    G Codes:       DElly Modena PT, DPT 03/15/17, 12:11 PM

## 2017-03-15 NOTE — Clinical Social Work Note (Signed)
CSW spoke with Kendall FlackKaren Sparati [phone]) with [facility]. She will be coming hospital to conduct PASARR assessment today between 3:30 and 4:30 PM. CSW notified unit. CSW will continue to follow.   Dede QuerySarah Debralee Braaksma, MSW, LCSW  Clinical Social Worker  [phone]

## 2017-03-16 NOTE — Care Management Important Message (Signed)
Important Message  Patient Details  Name: Alvin PeckJohn [name] MRN: [id number] Date of Birth: 12/07/1940   Medicare Important Message Given:  Yes    Gwenette GreetBrenda S Suhaila Troiano, RN 03/16/2017, 8:01 AM

## 2017-03-16 NOTE — Progress Notes (Signed)
[employer] - [place] at [facility]   PATIENT NAME: Alvin [name]    MR#:  [id number]  DATE OF BIRTH:  03/03/1941  SUBJECTIVE:   No acute issues overnight  REVIEW OF SYSTEMS:    Review of Systems  Constitutional: Negative for fever, chills weight loss HENT: Negative for ear pain, nosebleeds, congestion, facial swelling, rhinorrhea, neck pain, neck stiffness and ear discharge.   Respiratory: Negative for cough, shortness of breath, wheezing  Cardiovascular: Negative for chest pain, palpitations and leg swelling.  Gastrointestinal: Negative for heartburn, abdominal pain, vomiting, diarrhea or consitpation Genitourinary: Negative for dysuria, urgency, frequency, hematuria Positive urinary retention Musculoskeletal: Negative for back pain or joint pain Neurological: Negative for dizziness, seizures, syncope, focal weakness,  numbness and headaches.  Hematological: Does not bruise/bleed easily.  Psychiatric/Behavioral: Negative for hallucinations, confusion, dysphoric mood    Tolerating Diet:yes      DRUG ALLERGIES:  No Known Allergies  VITALS:  Blood pressure (!) 127/58, pulse 68, temperature 98.5 F (36.9 C), temperature source Oral, resp. rate 16, height 5\' 8"  (1.727 m), weight 85.3 kg (188 lb), SpO2 95 %.  PHYSICAL EXAMINATION:  Constitutional: Appears well-developed and well-nourished. No distress. HENT: Normocephalic. Marland Kitchen Oropharynx is clear and moist.  Eyes: Conjunctivae and EOM are normal. PERRLA, no scleral icterus.  Neck: Normal ROM. Neck supple. No JVD. No tracheal deviation. CVS: RRR, S1/S2 +, no murmurs, no gallops, no carotid bruit.  Pulmonary: Effort and breath sounds normal, no stridor, rhonchi, wheezes, rales.  Abdominal: Soft. BS +,  no distension, tenderness, rebound or guarding.  Incision clean and intact staples removed Musculoskeletal: Normal range of motion. No edema and no tenderness.  Neuro: Alert. CN 2-12 grossly intact. No focal  deficits. Skin: Skin is warm and dry. No rash noted. Psychiatric: Normal mood and affect.      LABORATORY PANEL:   CBC  Recent Labs Lab 03/13/17 0459  WBC 15.1*  HGB 12.3*  HCT 36.4*  PLT 206   ------------------------------------------------------------------------------------------------------------------  Chemistries   Recent Labs Lab 03/10/17 0436  03/13/17 0459  NA 148*  < > 144  K 3.6  < > 3.4*  CL 114*  < > 110  CO2 27  < > 27  GLUCOSE 166*  < > 138*  BUN 26*  < > 22*  CREATININE 1.07  < > 0.80  CALCIUM 8.0*  < > 7.7*  MG 2.1  --   --   < > = values in this interval not displayed. ------------------------------------------------------------------------------------------------------------------  Cardiac Enzymes No results for input(s): TROPONINI in the last 168 hours. ------------------------------------------------------------------------------------------------------------------  RADIOLOGY:  No results found.   ASSESSMENT AND PLAN:   76 year old male with bipolar disorder who presented with severe sepsis and abdominal pain.   1. Severe sepsis with septic shock due to aspiration pneumonia: Patient symptoms have improved since admission. He has been off of pressors. And about except been discontinued  2. Acute hypoxic respiratory failure due to pneumonia and ARDS: Patient has been extubated and he is clinically stable. Continue aggressive pulmonary toileting.   3. Abdominal pain with distention: Patient is postoperative day #13 exploratory laparotomy  Showing no evidence of ischemic bowel Continue dysphagia diet with aspiration cautions Surgery consultation was appreciated.  4. Urinary retention: Patient has been started on Flomax. He will need To continue In and out catheter every 8 hours until he is able to void by himself.   5. 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 Elevated troponin  due to demand ischemia.  Patient has been ruled out for acute coronary syndrome  7. Bipolar disorder: Continue Depakote zyprexa  8. Essential hypertension Continue Coreg and Lasix and lisinopril  Patient clinically and medically stable for discharge awaiting passr  Management plans discussed with the patient and he is in agreement.  CODE STATUS: DNR  TOTAL TIME TAKING CARE OF THIS PATIENT: 20 minutes.     POSSIBLE D/C today DEPENDING ON PASSR  Donnika Kucher M.D on 03/16/2017 at 7:17 AM  Between 7am to 6pm - Pager - [phone] After 6pm go to [email or web address] - password [email or web address]  [facility] [place] [facility]  Office  [phone]  CC: Primary care physician; [facility]  Note: This dictation was prepared with Dragon dictation along with smaller phrase technology. Any transcriptional errors that result from this process are unintentional.

## 2017-03-17 MED ORDER — DIVALPROEX SODIUM 125 MG PO CSDR: 250.0000 mg | DELAYED_RELEASE_CAPSULE | Freq: Every morning | ORAL | Status: DC

## 2017-03-17 MED ORDER — DIVALPROEX SODIUM 125 MG PO CSDR: 500.0000 mg | DELAYED_RELEASE_CAPSULE | Freq: Every day | ORAL | Status: DC

## 2017-03-17 NOTE — Clinical Social Work Placement (Signed)
   CLINICAL SOCIAL WORK PLACEMENT  NOTE  Date:  03/17/2017  Patient Details  Name: Alvin [name] MRN: [id number] Date of Birth: 01/26/1941  Clinical Social Work is seeking post-discharge placement for this patient at the Skilled  Nursing Facility level of care (*CSW will initial, date and re-position this form in  chart as items are completed):  Yes   Patient/family provided with [facility] Clinical Social Work Department's list of facilities offering this level of care within the geographic area requested by the patient (or if unable, by the patient's family).  Yes   Patient/family informed of their freedom to choose among providers that offer the needed level of care, that participate in Medicare, Medicaid or managed care program needed by the patient, have an available bed and are willing to accept the patient.  Yes   Patient/family informed of [facility]'s ownership interest in [facility] and [facility], as well as of the fact that they are under no obligation to receive care at these facilities.  PASRR submitted to EDS on 03/12/17     PASRR number received on 03/17/17     Existing PASRR number confirmed on       FL2 transmitted to all facilities in geographic area requested by pt/family on 03/12/17     FL2 transmitted to all facilities within larger geographic area on       Patient informed that his/her managed care company has contracts with or will negotiate with certain facilities, including the following:        Yes   Patient/family informed of bed offers received.  Patient chooses bed at [facility]     Physician recommends and patient chooses bed at      Patient to be transferred to [facility] on 03/17/17.  [facility]      Patient family notified on 03/17/17 of transfer.  Name of family member notified:  [name]     PHYSICIAN       Additional Comment:     _______________________________________________ Dede QuerySarah Summit Borchardt, LCSW 03/17/2017, 12:03 PM

## 2017-03-17 NOTE — Clinical Social Work Note (Signed)
Pt is ready for discharge today and will go to [facility]. PASARR has been obtained. [name] is aware and agreeable to discharge plan. Facility is ready to admit pt as they have received discharge information. RN will call report. [facility]. CSW will signing off as no further needs identified.   Dede QuerySarah Coulson Wehner, MSW, LCSW  Clinical Social Worker  [phone]

## 2017-03-17 NOTE — Discharge Summary (Addendum)
[facility] [place] [facility]   PATIENT NAME: Alvin [name]    MR#:  [id number]  DATE OF BIRTH:  Jan 27, 1941  DATE OF ADMISSION:  02/28/2017 ADMITTING PHYSICIAN: Tonye Royalty, DO  DATE OF DISCHARGE: 03/17/2017  PRIMARY CARE PHYSICIAN: [facility]    ADMISSION DIAGNOSIS:  Lactic acidosis [E87.2] Abdominal distention [R14.0] Demand ischemia (HCC) [I24.8] Severe sepsis with septic shock (HCC) [A41.9, R65.21] Elevated lipase [R74.8] Acute respiratory failure with hypoxemia (HCC) [J96.01] Community acquired pneumonia of left lower lobe of lung (HCC) [J18.1]  DISCHARGE DIAGNOSIS:  Principal Problem:   Subacute delirium Active Problems:   Severe sepsis with septic shock (HCC)   Acute respiratory failure with hypoxemia (HCC)   Abdominal distention   Lactic acidosis   Elevated lipase   Nonocclusive intestinal infarction (HCC)   Bipolar disorder (HCC)   SECONDARY DIAGNOSIS:   Past Medical History:  Diagnosis Date  . Bipolar disorder (HCC)   . BPH (benign prostatic hyperplasia)   . Cardiomyopathy (HCC)    a. 03/2017 Echo: EF 40%, mild diastolic dysfxn. Mild MR.  . Gout   . 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  . Hyperlipidemia   . Hypertension     HOSPITAL COURSE:  76 year old male with bipolar disorder who presented with severe sepsis and abdominal pain.   1. Severe sepsis with septic shock due to aspiration pneumonia: Patient symptoms have improved since admission. He has been off of pressors. And zosyn was discontinued  2. Acute hypoxic respiratory failure due to pneumonia and ARDS: Patient was extubated and he is clinically stable.  He has been treated with aggressive pulmonary toileting.   3. Abdominal pain with distention: Patient is postoperative day #14 exploratory laparotomy  Showing no evidence of ischemic bowel Continue dysphagia diet with aspiration cautions Surgery consultation was  appreciated.  4. Urinary retention: Patient has been started on Flomax. He will need In and out catheter every 8 hours until he is able to void by himself.   5. 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 Elevated troponin due to demand ischemia. Patient has been ruled out for acute coronary syndrome  7. Bipolar disorder: Continue Depakote and zyprexa 8. Essential HTN: Continue Coreg and Lasix and lisinopril   DISCHARGE CONDITIONS AND DIET:  Stable for discharge SNF today.   CONSULTS OBTAINED:  Treatment Team:  Clapacs, Jackquline Denmark, MD  DRUG ALLERGIES:  No Known Allergies  DISCHARGE MEDICATIONS:   Current Discharge Medication List    START taking these medications   Details  aspirin 81 MG chewable tablet Chew 1 tablet (81 mg total) by mouth daily.    bisacodyl (DULCOLAX) 5 MG EC tablet Take 1 tablet (5 mg total) by mouth daily as needed for moderate constipation. Qty: 30 tablet, Refills: 0    carvedilol (COREG) 6.25 MG tablet Take 1 tablet (6.25 mg total) by mouth 2 (two) times daily with a meal.    !! divalproex (DEPAKOTE SPRINKLE) 125 MG capsule Take 2 capsules (250 mg total) by mouth every morning.    !! divalproex (DEPAKOTE SPRINKLE) 125 MG capsule Take 4 capsules (500 mg total) by mouth at bedtime.     !! - Potential duplicate medications found. Please discuss with provider.    CONTINUE these medications which have CHANGED   Details  lisinopril (PRINIVIL,ZESTRIL) 2.5 MG tablet Take 1 tablet (2.5 mg total) by mouth daily.      CONTINUE these medications  which have NOT CHANGED   Details  allopurinol (ZYLOPRIM) 300 MG tablet Take 300 mg by mouth daily.    atorvastatin (LIPITOR) 20 MG tablet Take 20 mg by mouth daily.    cholecalciferol (VITAMIN D) 1000 units tablet Take 1,000 Units by mouth daily.    lanolin/mineral oil (KERI/THERA-DERM) LOTN Apply 1 application topically as needed for dry skin.    MELATONIN PO Take 3 mg by mouth  at bedtime.    menthol-cetylpyridinium (CEPACOL) 3 MG lozenge Take 1 lozenge by mouth as needed for sore throat.    Multiple Vitamins-Minerals (MULTIVITAMINS THER. W/MINERALS) TABS tablet Take 1 tablet by mouth daily.    OLANZapine (ZYPREXA) 15 MG tablet Take 15 mg by mouth at bedtime.    tamsulosin (FLOMAX) 0.4 MG CAPS capsule Take 0.4 mg by mouth daily.    thiamine (VITAMIN B-1) 100 MG tablet Take 100 mg by mouth daily.    traZODone (DESYREL) 50 MG tablet Take 50 mg by mouth at bedtime.    vitamin B-12 (CYANOCOBALAMIN) 1000 MCG tablet Take 1,000 mcg by mouth daily.      STOP taking these medications     divalproex (DEPAKOTE ER) 250 MG 24 hr tablet      divalproex (DEPAKOTE ER) 500 MG 24 hr tablet           Today   CHIEF COMPLAINT:     VITAL SIGNS:  Blood pressure 106/69, pulse 72, temperature 97.7 F (36.5 C), temperature source Oral, resp. rate 20, height 5\' 8"  (1.727 m), weight 207 lb (93.9 kg), SpO2 96 %.   REVIEW OF SYSTEMS:  Review of Systems  Constitutional: Negative.  Negative for chills, fever and malaise/fatigue.  HENT: Negative.  Negative for ear discharge, ear pain, hearing loss, nosebleeds and sore throat.   Eyes: Negative.  Negative for blurred vision and pain.  Respiratory: Negative.  Negative for cough, hemoptysis, shortness of breath and wheezing.   Cardiovascular: Negative.  Negative for chest pain, palpitations and leg swelling.  Gastrointestinal: Negative.  Negative for abdominal pain, blood in stool, diarrhea, nausea and vomiting.  Genitourinary: Negative for dysuria.       Urinary retention  Musculoskeletal: Negative.  Negative for back pain.  Skin: Negative.   Neurological: Negative for dizziness, tremors, speech change, focal weakness, seizures and headaches.  Endo/Heme/Allergies: Negative.  Does not bruise/bleed easily.  Psychiatric/Behavioral: Positive for depression. Negative for hallucinations and suicidal ideas.     PHYSICAL  EXAMINATION:  GENERAL:  76 y.o.-year-old patient lying in the bed with no acute distress.  NECK:  Supple, no jugular venous distention. No thyroid enlargement, no tenderness.  LUNGS: Normal breath sounds bilaterally, no wheezing, rales,rhonchi  No use of accessory muscles of respiration.  CARDIOVASCULAR: S1, S2 normal. No murmurs, rubs, or gallops.  ABDOMEN: Soft, non-tender, non-distended. Bowel sounds present. No organomegaly or mass.  EXTREMITIES: No pedal edema, cyanosis, or clubbing.  PSYCHIATRIC: The patient is alert and oriented x 3. Flat affect SKIN: No obvious rash, lesion, or ulcer.   DATA REVIEW:   CBC  Recent Labs Lab 03/13/17 0459  WBC 15.1*  HGB 12.3*  HCT 36.4*  PLT 206    Chemistries   Recent Labs Lab 03/13/17 0459  NA 144  K 3.4*  CL 110  CO2 27  GLUCOSE 138*  BUN 22*  CREATININE 0.80  CALCIUM 7.7*    Cardiac Enzymes No results for input(s): TROPONINI in the last 168 hours.  Microbiology Results  @MICRORSLT48 @  RADIOLOGY:  No results  found.    Current Discharge Medication List    START taking these medications   Details  aspirin 81 MG chewable tablet Chew 1 tablet (81 mg total) by mouth daily.    bisacodyl (DULCOLAX) 5 MG EC tablet Take 1 tablet (5 mg total) by mouth daily as needed for moderate constipation. Qty: 30 tablet, Refills: 0    carvedilol (COREG) 6.25 MG tablet Take 1 tablet (6.25 mg total) by mouth 2 (two) times daily with a meal.      CONTINUE these medications which have CHANGED   Details  lisinopril (PRINIVIL,ZESTRIL) 2.5 MG tablet Take 1 tablet (2.5 mg total) by mouth daily.      CONTINUE these medications which have NOT CHANGED   Details  allopurinol (ZYLOPRIM) 300 MG tablet Take 300 mg by mouth daily.    atorvastatin (LIPITOR) 20 MG tablet Take 20 mg by mouth daily.    cholecalciferol (VITAMIN D) 1000 units tablet Take 1,000 Units by mouth daily.    !! divalproex (DEPAKOTE ER) 250 MG 24 hr tablet Take 250 mg  by mouth daily.    !! divalproex (DEPAKOTE ER) 500 MG 24 hr tablet Take 500 mg by mouth daily.    lanolin/mineral oil (KERI/THERA-DERM) LOTN Apply 1 application topically as needed for dry skin.    MELATONIN PO Take 3 mg by mouth at bedtime.    menthol-cetylpyridinium (CEPACOL) 3 MG lozenge Take 1 lozenge by mouth as needed for sore throat.    Multiple Vitamins-Minerals (MULTIVITAMINS THER. W/MINERALS) TABS tablet Take 1 tablet by mouth daily.    OLANZapine (ZYPREXA) 15 MG tablet Take 15 mg by mouth at bedtime.    tamsulosin (FLOMAX) 0.4 MG CAPS capsule Take 0.4 mg by mouth daily.    thiamine (VITAMIN B-1) 100 MG tablet Take 100 mg by mouth daily.    traZODone (DESYREL) 50 MG tablet Take 50 mg by mouth at bedtime.    vitamin B-12 (CYANOCOBALAMIN) 1000 MCG tablet Take 1,000 mcg by mouth daily.     !! - Potential duplicate medications found. Please discuss with provider.         Management plans discussed with the patient and he is in agreement. Stable for discharge   Patient should follow up with pcp  CODE STATUS:     Code Status Orders        Start     Ordered   03/02/17 1053  Do not attempt resuscitation (DNR)  Continuous    Question Answer Comment  In the event of cardiac or respiratory ARREST Do not call a "code blue"   In the event of cardiac or respiratory ARREST Do not perform Intubation, CPR, defibrillation or ACLS   In the event of cardiac or respiratory ARREST Use medication by any route, position, wound care, and other measures to relive pain and suffering. May use oxygen, suction and manual treatment of airway obstruction as needed for comfort.      03/02/17 1052    Code Status History    Date Active Date Inactive Code Status Order ID Comments User Context   03/01/2017  2:00 AM 03/02/2017 10:52 AM Full Code [id number]  Hugelmeyer, Jon GillsAlexis, DO Inpatient      TOTAL TIME TAKING CARE OF THIS PATIENT: 33 minutes.    Note: This dictation was prepared with  Dragon dictation along with smaller phrase technology. Any transcriptional errors that result from this process are unintentional.  Shaune Pollackhen, Daisee Centner M.D on 03/17/2017 at 10:22 AM  Between 7am to 6pm -  Pager - [phone] After 6pm go to [email or web address] - Social research officer, government  [facility]  Office  [phone]  CC: Primary care physician; [facility]

## 2017-03-17 NOTE — Progress Notes (Signed)
Pt to go to [facility]  Via ems. No resp distress.  Bladder scanned at 1300  With 185 mls  Noted on scan.   Report called to kim hicks rn at [facility]. [facility] called for transport. Pt  In transfer pack. No distress.

## 2017-03-17 NOTE — Plan of Care (Signed)
Problem: Education: Goal: Knowledge of [facility] information/materials will improve Outcome: Progressing VSS, free of falls during shift.  Denies pain.  [facility] multiple times during shift, tolerated well.  Voided during shift.  Bladder scan 417cc, I/O cath 550cc.  No other needs overnight.  Low bed in low position, call bell within reach.  WCTM.

## 2017-03-17 NOTE — Progress Notes (Signed)
[facility] to [facility].  I/o  Performed prior to discharge and 600 ml drained. Bladder scan showed 507 ml in bladder.

## 2017-03-17 NOTE — Plan of Care (Signed)
Problem: SLP Dysphagia Goals Goal: Misc Dysphagia Goal Pt will safely tolerate po diet of least restrictive consistency w/ no overt s/s of aspiration noted by Staff/pt/family x3 sessions.    

## 2017-03-23 ENCOUNTER — Emergency Department: Admission: EM | Admit: 2017-03-23 | Payer: Medicare Other | Source: Home / Self Care

## 2017-03-23 ENCOUNTER — Inpatient Hospital Stay
Admission: EM | Admit: 2017-03-23 | Discharge: 2017-03-23 | DRG: 871 | Disposition: A | Discharge status: Other Acute Inpatient Hospital | Payer: Medicare Other | Attending: Internal Medicine | Admitting: Internal Medicine

## 2017-03-23 ENCOUNTER — Ambulatory Visit (HOSPITAL_COMMUNITY)
Admission: AD | Admit: 2017-03-23 | Discharge: 2017-03-23 | Disposition: A | Discharge status: Home / Self Care | Payer: Medicare Other | Source: Other Acute Inpatient Hospital | Attending: Internal Medicine | Admitting: Internal Medicine

## 2017-03-23 ENCOUNTER — Encounter: Payer: Self-pay | Admitting: Internal Medicine

## 2017-03-23 ENCOUNTER — Emergency Department: Payer: Medicare Other

## 2017-03-23 ENCOUNTER — Inpatient Hospital Stay: Payer: Medicare Other

## 2017-03-23 DIAGNOSIS — E785 Hyperlipidemia, unspecified: Secondary | ICD-10-CM | POA: Diagnosis present

## 2017-03-23 DIAGNOSIS — I429 Cardiomyopathy, unspecified: Secondary | ICD-10-CM | POA: Diagnosis present

## 2017-03-23 DIAGNOSIS — Z7983 Long term (current) use of bisphosphonates: Secondary | ICD-10-CM | POA: Diagnosis not present

## 2017-03-23 DIAGNOSIS — A419 Sepsis, unspecified organism: Secondary | ICD-10-CM | POA: Insufficient documentation

## 2017-03-23 DIAGNOSIS — Z7982 Long term (current) use of aspirin: Secondary | ICD-10-CM | POA: Diagnosis not present

## 2017-03-23 DIAGNOSIS — N3001 Acute cystitis with hematuria: Secondary | ICD-10-CM | POA: Insufficient documentation

## 2017-03-23 DIAGNOSIS — F319 Bipolar disorder, unspecified: Secondary | ICD-10-CM | POA: Diagnosis present

## 2017-03-23 DIAGNOSIS — N400 Enlarged prostate without lower urinary tract symptoms: Secondary | ICD-10-CM | POA: Diagnosis present

## 2017-03-23 DIAGNOSIS — I959 Hypotension, unspecified: Secondary | ICD-10-CM | POA: Diagnosis present

## 2017-03-23 DIAGNOSIS — Z66 Do not resuscitate: Secondary | ICD-10-CM | POA: Diagnosis present

## 2017-03-23 DIAGNOSIS — Z79899 Other long term (current) drug therapy: Secondary | ICD-10-CM | POA: Diagnosis not present

## 2017-03-23 DIAGNOSIS — N3000 Acute cystitis without hematuria: Secondary | ICD-10-CM | POA: Diagnosis present

## 2017-03-23 DIAGNOSIS — R109 Unspecified abdominal pain: Secondary | ICD-10-CM

## 2017-03-23 DIAGNOSIS — B9620 Unspecified Escherichia coli [E. coli] as the cause of diseases classified elsewhere: Secondary | ICD-10-CM | POA: Diagnosis present

## 2017-03-23 DIAGNOSIS — I10 Essential (primary) hypertension: Secondary | ICD-10-CM | POA: Diagnosis present

## 2017-03-23 DIAGNOSIS — J189 Pneumonia, unspecified organism: Secondary | ICD-10-CM

## 2017-03-23 DIAGNOSIS — M109 Gout, unspecified: Secondary | ICD-10-CM | POA: Diagnosis present

## 2017-03-23 DIAGNOSIS — R410 Disorientation, unspecified: Secondary | ICD-10-CM | POA: Diagnosis present

## 2017-03-23 DIAGNOSIS — R6521 Severe sepsis with septic shock: Secondary | ICD-10-CM | POA: Diagnosis present

## 2017-03-23 DIAGNOSIS — Y95 Nosocomial condition: Secondary | ICD-10-CM | POA: Diagnosis present

## 2017-03-23 DIAGNOSIS — Z950 Presence of cardiac pacemaker: Secondary | ICD-10-CM | POA: Diagnosis not present

## 2017-03-23 DIAGNOSIS — G9341 Metabolic encephalopathy: Secondary | ICD-10-CM | POA: Diagnosis present

## 2017-03-23 DIAGNOSIS — J181 Lobar pneumonia, unspecified organism: Secondary | ICD-10-CM

## 2017-03-23 LAB — URINALYSIS, COMPLETE (UACMP) WITH MICROSCOPIC
BILIRUBIN URINE: NEGATIVE
Glucose, UA: 50 mg/dL — AB
Ketones, ur: NEGATIVE mg/dL
Nitrite: NEGATIVE
PROTEIN: NEGATIVE mg/dL
SQUAMOUS EPITHELIAL / LPF: NONE SEEN
Specific Gravity, Urine: 1.014 (ref 1.005–1.030)
pH: 5 (ref 5.0–8.0)

## 2017-03-23 LAB — COMPREHENSIVE METABOLIC PANEL
ALK PHOS: 255 U/L — AB (ref 38–126)
ALT: 84 U/L — ABNORMAL HIGH (ref 17–63)
ANION GAP: 11 (ref 5–15)
AST: 105 U/L — ABNORMAL HIGH (ref 15–41)
Albumin: 2.2 g/dL — ABNORMAL LOW (ref 3.5–5.0)
BILIRUBIN TOTAL: 0.8 mg/dL (ref 0.3–1.2)
BUN: 24 mg/dL — ABNORMAL HIGH (ref 6–20)
CHLORIDE: 104 mmol/L (ref 101–111)
CO2: 22 mmol/L (ref 22–32)
CREATININE: 1.8 mg/dL — AB (ref 0.61–1.24)
Calcium: 8.2 mg/dL — ABNORMAL LOW (ref 8.9–10.3)
GFR, EST AFRICAN AMERICAN: 41 mL/min — AB (ref >60)
GFR, EST NON AFRICAN AMERICAN: 35 mL/min — AB (ref >60)
Glucose, Bld: 222 mg/dL — ABNORMAL HIGH (ref 65–99)
Potassium: 4.8 mmol/L (ref 3.5–5.1)
Sodium: 137 mmol/L (ref 135–145)
Total Protein: 5.6 g/dL — ABNORMAL LOW (ref 6.5–8.1)

## 2017-03-23 LAB — CBC WITH DIFFERENTIAL/PLATELET
Basophils Absolute: 0 10*3/uL (ref 0–0.1)
Basophils Relative: 0 %
EOS ABS: 0 10*3/uL (ref 0–0.7)
EOS PCT: 0 %
HCT: 39.7 % — ABNORMAL LOW (ref 40.0–52.0)
Hemoglobin: 13.4 g/dL (ref 13.0–18.0)
LYMPHS PCT: 5 %
Lymphs Abs: 1.2 10*3/uL (ref 1.0–3.6)
MCH: 33 pg (ref 26.0–34.0)
MCHC: 33.6 g/dL (ref 32.0–36.0)
MCV: 98 fL (ref 80.0–100.0)
MONO ABS: 2.6 10*3/uL — AB (ref 0.2–1.0)
Monocytes Relative: 11 %
NEUTROS PCT: 84 %
Neutro Abs: 20.2 10*3/uL — ABNORMAL HIGH (ref 1.4–6.5)
Platelets: 204 10*3/uL (ref 150–440)
RBC: 4.05 MIL/uL — AB (ref 4.40–5.90)
RDW: 14.2 % (ref 11.5–14.5)
WBC: 24 10*3/uL — AB (ref 3.8–10.6)

## 2017-03-23 LAB — LACTIC ACID, PLASMA
Lactic Acid, Venous: 3.9 mmol/L (ref 0.5–1.9)
Lactic Acid, Venous: 3.6 mmol/L (ref 0.5–1.9)

## 2017-03-23 LAB — GLUCOSE, CAPILLARY: GLUCOSE-CAPILLARY: 204 mg/dL — AB (ref 65–99)

## 2017-03-23 LAB — MRSA PCR SCREENING: MRSA by PCR: NEGATIVE

## 2017-03-23 LAB — PROCALCITONIN: PROCALCITONIN: 11.97 ng/mL

## 2017-03-23 MED ORDER — OLANZAPINE 15 MG PO TABS: 15.0000 mg | ORAL_TABLET | Freq: Every day | ORAL | Status: DC

## 2017-03-23 MED ORDER — SODIUM CHLORIDE 0.9 % IV SOLN
0.0000 ug/min | INTRAVENOUS | Status: DC
  Administered 2017-03-23: 20 ug/min via INTRAVENOUS
  Filled 2017-03-23 (×2): qty 1

## 2017-03-23 MED ORDER — SODIUM CHLORIDE 0.9 % IV BOLUS (SEPSIS)
1000.0000 mL | Freq: Once | INTRAVENOUS | Status: AC
  Administered 2017-03-23: 1000 mL via INTRAVENOUS

## 2017-03-23 MED ORDER — DIVALPROEX SODIUM 125 MG PO CSDR
250.0000 mg | DELAYED_RELEASE_CAPSULE | Freq: Every morning | ORAL | Status: DC
  Administered 2017-03-23: 250 mg via ORAL
  Filled 2017-03-23: qty 2

## 2017-03-23 MED ORDER — CYANOCOBALAMIN 1000 MCG PO TABS: 1000.0000 ug | ORAL_TABLET | Freq: Every day | ORAL | Status: AC

## 2017-03-23 MED ORDER — ONDANSETRON HCL 4 MG PO TABS: 4.0000 mg | ORAL_TABLET | Freq: Four times a day (QID) | ORAL | 0 refills | Status: AC | PRN

## 2017-03-23 MED ORDER — THIAMINE HCL 100 MG PO TABS: 100.0000 mg | ORAL_TABLET | Freq: Every day | ORAL | Status: AC

## 2017-03-23 MED ORDER — VANCOMYCIN HCL 10 G IV SOLR
1500.0000 mg | INTRAVENOUS | Status: DC
  Administered 2017-03-23: 1500 mg via INTRAVENOUS
  Filled 2017-03-23 (×2): qty 1500

## 2017-03-23 MED ORDER — OLANZAPINE 7.5 MG PO TABS
15.0000 mg | ORAL_TABLET | Freq: Every day | ORAL | Status: DC
  Administered 2017-03-23: 15 mg via ORAL
  Filled 2017-03-23: qty 2

## 2017-03-23 MED ORDER — VANCOMYCIN HCL IN DEXTROSE 1-5 GM/200ML-% IV SOLN
1000.0000 mg | Freq: Once | INTRAVENOUS | Status: AC
  Administered 2017-03-23: 1000 mg via INTRAVENOUS

## 2017-03-23 MED ORDER — PIPERACILLIN-TAZOBACTAM 3.375 G IVPB
3.3750 g | Freq: Three times a day (TID) | INTRAVENOUS | Status: DC
  Administered 2017-03-23 (×2): 3.375 g via INTRAVENOUS
  Filled 2017-03-23 (×2): qty 50

## 2017-03-23 MED ORDER — PIPERACILLIN-TAZOBACTAM 3.375 G IVPB: 3.3750 g | Freq: Three times a day (TID) | INTRAVENOUS | Status: DC

## 2017-03-23 MED ORDER — SENNOSIDES-DOCUSATE SODIUM 8.6-50 MG PO TABS: 1.0000 | ORAL_TABLET | Freq: Every evening | ORAL | Status: DC | PRN

## 2017-03-23 MED ORDER — TRAZODONE HCL 50 MG PO TABS: 50.0000 mg | ORAL_TABLET | Freq: Every day | ORAL | Status: DC

## 2017-03-23 MED ORDER — ASPIRIN 81 MG PO CHEW
81.0000 mg | CHEWABLE_TABLET | Freq: Every day | ORAL | Status: DC
  Administered 2017-03-23: 81 mg via ORAL

## 2017-03-23 MED ORDER — DIVALPROEX SODIUM 125 MG PO CSDR
500.0000 mg | DELAYED_RELEASE_CAPSULE | Freq: Every day | ORAL | Status: DC
  Administered 2017-03-23: 500 mg via ORAL
  Filled 2017-03-23: qty 4

## 2017-03-23 MED ORDER — ONDANSETRON HCL 4 MG/2ML IJ SOLN
4.0000 mg | Freq: Four times a day (QID) | INTRAMUSCULAR | Status: DC | PRN
Start: 2017-03-23 — End: 2017-03-24

## 2017-03-23 MED ORDER — SODIUM CHLORIDE 0.9 % IV SOLN
0.0000 ug/min | INTRAVENOUS | Status: DC
  Administered 2017-03-23: 45 ug/min via INTRAVENOUS
  Filled 2017-03-23: qty 4

## 2017-03-23 MED ORDER — ACETAMINOPHEN 650 MG RE SUPP: 650.0000 mg | Freq: Four times a day (QID) | RECTAL | 0 refills | Status: DC | PRN

## 2017-03-23 MED ORDER — DIVALPROEX SODIUM 125 MG PO CSDR: 500.0000 mg | DELAYED_RELEASE_CAPSULE | Freq: Every day | ORAL | Status: DC

## 2017-03-23 MED ORDER — ACETAMINOPHEN 325 MG PO TABS: 650.0000 mg | ORAL_TABLET | Freq: Four times a day (QID) | ORAL | Status: DC | PRN

## 2017-03-23 MED ORDER — TRAZODONE HCL 50 MG PO TABS
50.0000 mg | ORAL_TABLET | Freq: Every day | ORAL | Status: DC
  Administered 2017-03-23: 50 mg via ORAL
  Filled 2017-03-23: qty 1

## 2017-03-23 MED ORDER — TAMSULOSIN HCL 0.4 MG PO CAPS: 0.4000 mg | ORAL_CAPSULE | Freq: Every day | ORAL | Status: DC

## 2017-03-23 MED ORDER — ONDANSETRON HCL 4 MG/2ML IJ SOLN: 4.0000 mg | Freq: Four times a day (QID) | INTRAMUSCULAR | 0 refills | Status: DC | PRN

## 2017-03-23 MED ORDER — ACETAMINOPHEN 650 MG RE SUPP: 650.0000 mg | Freq: Four times a day (QID) | RECTAL | Status: DC | PRN

## 2017-03-23 MED ORDER — HEPARIN SODIUM (PORCINE) 5000 UNIT/ML IJ SOLN
5000.0000 [IU] | Freq: Three times a day (TID) | INTRAMUSCULAR | Status: DC
  Administered 2017-03-23 (×3): 5000 [IU] via SUBCUTANEOUS
  Filled 2017-03-23: qty 1

## 2017-03-23 MED ORDER — SODIUM CHLORIDE 0.9 % IV SOLN
INTRAVENOUS | Status: DC
  Administered 2017-03-23: 05:00:00 via INTRAVENOUS

## 2017-03-23 MED ORDER — MELATONIN 5 MG PO TABS: 5.0000 mg | ORAL_TABLET | Freq: Every day | ORAL | 0 refills | Status: DC

## 2017-03-23 MED ORDER — DIVALPROEX SODIUM 125 MG PO CSDR: 250.0000 mg | DELAYED_RELEASE_CAPSULE | Freq: Every morning | ORAL | Status: DC

## 2017-03-23 MED ORDER — MELATONIN 5 MG PO TABS
5.0000 mg | ORAL_TABLET | Freq: Every day | ORAL | Status: DC
  Administered 2017-03-23: 5 mg via ORAL
  Filled 2017-03-23: qty 1

## 2017-03-23 MED ORDER — TAMSULOSIN HCL 0.4 MG PO CAPS
0.4000 mg | ORAL_CAPSULE | Freq: Every day | ORAL | Status: DC
  Administered 2017-03-23: 0.4 mg via ORAL

## 2017-03-23 MED ORDER — HEPARIN SODIUM (PORCINE) 5000 UNIT/ML IJ SOLN: 5000.0000 [IU] | Freq: Three times a day (TID) | INTRAMUSCULAR | Status: DC

## 2017-03-23 MED ORDER — VITAMIN B-1 100 MG PO TABS
100.0000 mg | ORAL_TABLET | Freq: Every day | ORAL | Status: DC
  Administered 2017-03-23: 100 mg via ORAL

## 2017-03-23 MED ORDER — ATORVASTATIN CALCIUM 20 MG PO TABS
20.0000 mg | ORAL_TABLET | Freq: Every day | ORAL | Status: DC
  Administered 2017-03-23: 20 mg via ORAL
  Filled 2017-03-23: qty 1

## 2017-03-23 MED ORDER — SODIUM CHLORIDE 0.9 % IV SOLN: 1500.0000 mg | INTRAVENOUS | Status: DC

## 2017-03-23 MED ORDER — NOREPINEPHRINE BITARTRATE 1 MG/ML IV SOLN
0.0000 ug/min | INTRAVENOUS | Status: DC
  Administered 2017-03-23: 4 ug/min via INTRAVENOUS
  Filled 2017-03-23: qty 4

## 2017-03-23 MED ORDER — ONDANSETRON HCL 4 MG PO TABS: 4.0000 mg | ORAL_TABLET | Freq: Four times a day (QID) | ORAL | Status: DC | PRN

## 2017-03-23 MED ORDER — ATORVASTATIN CALCIUM 20 MG PO TABS: 20.0000 mg | ORAL_TABLET | Freq: Every day | ORAL | Status: DC

## 2017-03-23 MED ORDER — SODIUM CHLORIDE 0.9 % IV BOLUS (SEPSIS)
1000.0000 mL | INTRAVENOUS | Status: AC
Start: 2017-03-23 — End: 2017-03-23
  Administered 2017-03-23 (×2): 1000 mL via INTRAVENOUS

## 2017-03-23 MED ORDER — PIPERACILLIN-TAZOBACTAM 3.375 G IVPB 30 MIN
3.3750 g | Freq: Once | INTRAVENOUS | Status: AC
  Administered 2017-03-23: 3.375 g via INTRAVENOUS

## 2017-03-23 MED ORDER — ASPIRIN 81 MG PO CHEW: 81.0000 mg | CHEWABLE_TABLET | Freq: Every day | ORAL | Status: AC

## 2017-03-23 MED ORDER — ACETAMINOPHEN 500 MG PO TABS
1000.0000 mg | ORAL_TABLET | Freq: Once | ORAL | Status: AC
  Administered 2017-03-23: 1000 mg via ORAL

## 2017-03-23 MED ORDER — VITAMIN B-12 1000 MCG PO TABS
1000.0000 ug | ORAL_TABLET | Freq: Every day | ORAL | Status: DC
  Administered 2017-03-23: 1000 ug via ORAL

## 2017-03-23 NOTE — Progress Notes (Signed)
Report was called to Agustin Creearlene, Charity fundraiserN at [facility]. [facility] notified, they should arrive after 1900.

## 2017-03-23 NOTE — Care Management (Signed)
  Mcclary,Alvin [name] [phone]   Her last name is Deirdre Peernnis now per Gaylyn RongLaurie Veasey [facility]. This RNCM presented [facility] transfer form/decline transfer form to patient however he would not wake up enough for me to assess orientation and ability to make health care decisions. Message left for patient [name] to call this RNCM back. RNCM will continue to follow.

## 2017-03-23 NOTE — Discharge Summary (Signed)
Physician Discharge/Transfer Summary  Patient ID: Alvin [name] MRN: [id number] DOB/AGE: 76/12/1940 76 y.o.  Admit date: 03/23/2017 Discharge date: 03/23/2017  Admission Diagnoses: Severe septic shock with hypotension.   Discharge Diagnoses:  Active Problems:   Sepsis (HCC) Same as above.   Discharged Condition: serious  Hospital Course:  The patient was admitted to the hospital with fever and leukocytosis. He was found to be hypotensive, he underwent volume resuscitation with several liters of NS. He required pressors initially but was weaned off after a few hours in the ICU. His mental status remained somnolent but arousable.   [facility]   Significant Diagnostic Studies: --    Discharge Exam: Blood pressure (!) 114/96, pulse 91, temperature 98 F (36.7 C), temperature source Oral, resp. rate (!) 22, height 5\' 8"  (1.727 m), weight 192 lb 0.3 oz (87.1 kg), SpO2 94 %. Pls see today's note.   Disposition: [facility].    Current Facility-Administered Medications:  .  acetaminophen (TYLENOL) tablet 650 mg, 650 mg, Oral, Q6H PRN **OR** acetaminophen (TYLENOL) suppository 650 mg, 650 mg, Rectal, Q6H PRN, Pyreddy, Pavan, MD .  aspirin chewable tablet 81 mg, 81 mg, Oral, Daily, Pyreddy, Pavan, MD, 81 mg at 03/23/17 0917 .  atorvastatin (LIPITOR) tablet 20 mg, 20 mg, Oral, Daily, Pyreddy, Pavan, MD .  divalproex (DEPAKOTE SPRINKLE) capsule 250 mg, 250 mg, Oral, q morning - 10a, Pyreddy, Pavan, MD, 250 mg at 03/23/17 0917 .  divalproex (DEPAKOTE SPRINKLE) capsule 500 mg, 500 mg, Oral, QHS, Pyreddy, Pavan, MD .  heparin injection 5,000 Units, 5,000 Units, Subcutaneous, Q8H, Pyreddy, Pavan, MD, 5,000 Units at 03/23/17 1400 .  Melatonin TABS 5 mg, 5 mg, Oral, QHS, Pyreddy, Pavan, MD .  OLANZapine (ZYPREXA) tablet 15 mg, 15 mg, Oral, QHS, Pyreddy, Pavan, MD .  ondansetron (ZOFRAN) tablet 4 mg, 4 mg, Oral, Q6H PRN **OR**  ondansetron (ZOFRAN) injection 4 mg, 4 mg, Intravenous, Q6H PRN, Pyreddy, Pavan, MD .  phenylephrine (NEO-SYNEPHRINE) 40 mg in sodium chloride 0.9 % 250 mL (0.16 mg/mL) infusion, 0-400 mcg/min, Intravenous, Titrated, Milagros LollSudini, Srikar, MD, Stopped at 03/23/17 1412 .  piperacillin-tazobactam (ZOSYN) IVPB 3.375 g, 3.375 g, Intravenous, Q8H, Darci CurrentBrown, Alexis N, MD, Stopped at 03/23/17 1318 .  senna-docusate (Senokot-S) tablet 1 tablet, 1 tablet, Oral, QHS PRN, Pyreddy, Pavan, MD .  tamsulosin (FLOMAX) capsule 0.4 mg, 0.4 mg, Oral, Daily, Pyreddy, Pavan, MD, 0.4 mg at 03/23/17 0917 .  thiamine (VITAMIN B-1) tablet 100 mg, 100 mg, Oral, Daily, Pyreddy, Pavan, MD, 100 mg at 03/23/17 0917 .  traZODone (DESYREL) tablet 50 mg, 50 mg, Oral, QHS, Pyreddy, Pavan, MD .  vancomycin (VANCOCIN) 1,500 mg in sodium chloride 0.9 % 500 mL IVPB, 1,500 mg, Intravenous, Q24H, Darci CurrentBrown, Wheaton N, MD, Stopped at 03/23/17 1043 .  vitamin B-12 (CYANOCOBALAMIN) tablet 1,000 mcg, 1,000 mcg, Oral, Daily, Pyreddy, Pavan, MD, 1,000 mcg at 03/23/17 [id number]   Signed: Shane CrutchPradeep Candi Profit 03/23/2017, 3:36 PM

## 2017-03-23 NOTE — Progress Notes (Signed)
Spoke with medical [facility], and gave report/update. Pt has been accepted for transfer but currently waiting for bed. They will arrange transport when a bed is available.   Pt is currently Stepdown status, but can likely be changed to gen med tomorrow AM if stays off of pressors through the night.   Nicholos Johns-Tabatha Razzano, M.D. 03/23/2017

## 2017-03-23 NOTE — H&P (Addendum)
[facility] Physicians - [place] at [facility]   PATIENT NAME: Alvin [name]    MR#:  [id number]  DATE OF BIRTH:  [birth year]  DATE OF ADMISSION:  03/23/2017  PRIMARY CARE PHYSICIAN: [facility]   REQUESTING/REFERRING PHYSICIAN:   CHIEF COMPLAINT:   Chief Complaint  Patient presents with  . Code Sepsis    HISTORY OF PRESENT ILLNESS: Alvin [name]  is a 76 y.o. male with a known history of bipolar disorder, cardiomyopathy, pacemaker, hyperlipidemia, hypertension presented to the emergency room with fever.patient was referred from the facility for evaluation of fever. Patient is not a great historian. He is awake and responds to verbal commands but not completely oriented. He was evaluated in the emergency room lactic acid level was elevated and chest x-ray revealed pneumonia.blood pressure was also on the lower side. Code sepsis was called and patient was given IV fluids 3 L in the emergency room and started on broad-spectrum antibiotics. Hospitalist service was consulted.  PAST MEDICAL HISTORY:   Past Medical History:  Diagnosis Date  . Bipolar disorder (HCC)   . BPH (benign prostatic hyperplasia)   . Cardiomyopathy (HCC)    a. 03/2017 Echo: EF 40%, mild diastolic dysfxn. Mild MR.  . Gout   . 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  . Hyperlipidemia   . Hypertension     PAST SURGICAL HISTORY: Past Surgical History:  Procedure Laterality Date  . LAPAROTOMY N/A 03/01/2017   Procedure: EXPLORATORY LAPAROTOMY;  Surgeon: Ancil Linsey, MD;  Location: [facility] ORS;  Service: General;  Laterality: N/A;    SOCIAL HISTORY:  Social History  Substance Use Topics  . Smoking status: Never Smoker  . Smokeless tobacco: Never Used  . Alcohol use No    FAMILY HISTORY: No family history on file.  DRUG ALLERGIES: No Known Allergies  REVIEW OF SYSTEMS:  Could not be obtained as patient is confused  MEDICATIONS AT HOME:  Prior to  Admission medications   Medication Sig Start Date End Date Taking? Authorizing Provider  allopurinol (ZYLOPRIM) 300 MG tablet Take 300 mg by mouth daily.    [provider]  aspirin 81 MG chewable tablet Chew 1 tablet (81 mg total) by mouth daily. 03/13/17   Shaune Pollack, MD  atorvastatin (LIPITOR) 20 MG tablet Take 20 mg by mouth daily.    [provider]  bisacodyl (DULCOLAX) 5 MG EC tablet Take 1 tablet (5 mg total) by mouth daily as needed for moderate constipation. 03/13/17   Shaune Pollack, MD  carvedilol (COREG) 6.25 MG tablet Take 1 tablet (6.25 mg total) by mouth 2 (two) times daily with a meal. 03/13/17   Shaune Pollack, MD  cholecalciferol (VITAMIN D) 1000 units tablet Take 1,000 Units by mouth daily.    [provider]  divalproex (DEPAKOTE SPRINKLE) 125 MG capsule Take 2 capsules (250 mg total) by mouth every morning. 03/17/17   Shaune Pollack, MD  divalproex (DEPAKOTE SPRINKLE) 125 MG capsule Take 4 capsules (500 mg total) by mouth at bedtime. 03/17/17   Shaune Pollack, MD  lanolin/mineral oil (KERI/THERA-DERM) LOTN Apply 1 application topically as needed for dry skin.    [provider]  lisinopril (PRINIVIL,ZESTRIL) 2.5 MG tablet Take 1 tablet (2.5 mg total) by mouth daily. 03/13/17   Shaune Pollack, MD  MELATONIN PO Take 3 mg by mouth at bedtime.    [provider]  menthol-cetylpyridinium (CEPACOL) 3 MG lozenge Take 1 lozenge by mouth  as needed for sore throat.    [provider]  Multiple Vitamins-Minerals (MULTIVITAMINS THER. W/MINERALS) TABS tablet Take 1 tablet by mouth daily.    [provider]  OLANZapine (ZYPREXA) 15 MG tablet Take 15 mg by mouth at bedtime.    [provider]  tamsulosin (FLOMAX) 0.4 MG CAPS capsule Take 0.4 mg by mouth daily.    [provider]  thiamine (VITAMIN B-1) 100 MG tablet Take 100 mg by mouth daily.    [provider]  traZODone (DESYREL) 50 MG tablet Take 50 mg by mouth at bedtime.     [provider]  vitamin B-12 (CYANOCOBALAMIN) 1000 MCG tablet Take 1,000 mcg by mouth daily.    [provider]      PHYSICAL EXAMINATION:   VITAL SIGNS: Blood pressure 90/62, pulse 88, temperature (!) 101.1 F (38.4 C), temperature source Rectal, resp. rate (!) 26, height 5\' 8"  (1.727 m), weight 93.9 kg (207 lb), SpO2 97 %.  GENERAL:  76 y.o.-year-old patient lying in the bed not completely oriented EYES: Pupils equal, round, reactive to light and accommodation. No scleral icterus. Extraocular muscles intact.  HEENT: Head atraumatic, normocephalic. Oropharynx dry and nasopharynx clear.  NECK:  Supple, no jugular venous distention. No thyroid enlargement, no tenderness.  LUNGS: Normal breath sounds bilaterally, no wheezing, rales,rhonchi or crepitation. No use of accessory muscles of respiration.  CARDIOVASCULAR: S1, S2 tachycardia noted. No murmurs, rubs, or gallops.  ABDOMEN: Soft, nontender, nondistended. Bowel sounds present. No organomegaly or mass.  EXTREMITIES: No pedal edema, cyanosis, or clubbing.  NEUROLOGIC: Not completely oriented to time, place and person Moves all extremities. PSYCHIATRIC: could not be assessed SKIN: No obvious rash, lesion, or ulcer.   LABORATORY PANEL:   CBC  Recent Labs Lab 03/23/17 0108  WBC 24.0*  HGB 13.4  HCT 39.7*  PLT 204  MCV 98.0  MCH 33.0  MCHC 33.6  RDW 14.2  LYMPHSABS 1.2  MONOABS 2.6*  EOSABS 0.0  BASOSABS 0.0   ------------------------------------------------------------------------------------------------------------------  Chemistries   Recent Labs Lab 03/23/17 0108  NA 137  K 4.8  CL 104  CO2 22  GLUCOSE 222*  BUN 24*  CREATININE 1.80*  CALCIUM 8.2*  AST 105*  ALT 84*  ALKPHOS 255*  BILITOT 0.8   ------------------------------------------------------------------------------------------------------------------ estimated creatinine clearance is 39.4 mL/min (A) (by C-G formula based on  SCr of 1.8 mg/dL (H)). ------------------------------------------------------------------------------------------------------------------ No results for input(s): TSH, T4TOTAL, T3FREE, THYROIDAB in the last 72 hours.  Invalid input(s): FREET3   Coagulation profile No results for input(s): INR, PROTIME in the last 168 hours. ------------------------------------------------------------------------------------------------------------------- No results for input(s): DDIMER in the last 72 hours. -------------------------------------------------------------------------------------------------------------------  Cardiac Enzymes No results for input(s): CKMB, TROPONINI, MYOGLOBIN in the last 168 hours.  Invalid input(s): CK ------------------------------------------------------------------------------------------------------------------ Invalid input(s): POCBNP  ---------------------------------------------------------------------------------------------------------------  Urinalysis    Component Value Date/Time   COLORURINE AMBER (A) 03/23/2017 0110   APPEARANCEUR CLOUDY (A) 03/23/2017 0110   LABSPEC 1.014 03/23/2017 0110   PHURINE 5.0 03/23/2017 0110   GLUCOSEU 50 (A) 03/23/2017 0110   HGBUR MODERATE (A) 03/23/2017 0110   BILIRUBINUR NEGATIVE 03/23/2017 0110   KETONESUR NEGATIVE 03/23/2017 0110   PROTEINUR NEGATIVE 03/23/2017 0110   NITRITE NEGATIVE 03/23/2017 0110   LEUKOCYTESUR LARGE (A) 03/23/2017 0110     RADIOLOGY: Dg Chest Port 1 View  Result Date: 03/23/2017 CLINICAL DATA:  Acute onset of fever and leukocytosis. Initial encounter. EXAM: PORTABLE CHEST 1 VIEW COMPARISON:  Chest radiograph performed 03/10/2017 FINDINGS: The lungs  are well-aerated. Mild left basilar airspace opacity raises concern for pneumonia. There is no evidence of pleural effusion or pneumothorax. The cardiomediastinal silhouette is borderline normal in size. A pacemaker is noted overlying the left chest  wall, with leads ending overlying the right atrium and right ventricle. No acute osseous abnormalities are seen. IMPRESSION: Mild left basilar airspace opacity raises concern for pneumonia. Electronically Signed   By: Roanna RaiderJeffery  Chang M.D.   On: 03/23/2017 01:28    EKG: Orders placed or performed during the hospital encounter of 03/23/17  . ED EKG 12-Lead  . EKG 12-Lead  . EKG 12-Lead  . ED EKG 12-Lead    IMPRESSION AND PLAN: 76 year old male patient with history of benign prostate hypertrophy, hypertension, hyperlipidemia, cardiomyopathy presented to the emergency room with fever and confusion and low blood pressure. Admitting diagnosis 1. Severe sepsis 2. Hypotension 3. Healthcare associated pneumonia 4. Urinary tract infection Treatment plan Admit patient to stepdown unit IV fluid hydration Start patient on IV vancomycin and IV Zosyn antibiotic Follow-up cultures IV Levophed drip for support of blood pressure DVT prophylaxis subcutaneous heparin Intensivist on call notified  All the records are reviewed and case discussed with ED provider. Management plans discussed with the patient, family and they are in agreement.  CODE STATUS:DNR Code Status History    Date Active Date Inactive Code Status Order ID Comments User Context   03/02/2017 10:52 AM 03/17/2017  9:06 PM DNR [id number]  Shane Crutchamachandran, Pradeep, MD Inpatient   03/01/2017  2:00 AM 03/02/2017 10:52 AM Full Code [id number]  Hugelmeyer, Jon GillsAlexis, DO Inpatient    Questions for Most Recent Historical Code Status (Order [id number])    Question Answer Comment   In the event of cardiac or respiratory ARREST Do not call a "code blue"    In the event of cardiac or respiratory ARREST Do not perform Intubation, CPR, defibrillation or ACLS    In the event of cardiac or respiratory ARREST Use medication by any route, position, wound care, and other measures to relive pain and suffering. May use oxygen, suction and manual treatment of airway  obstruction as needed for comfort.         Advance Directive Documentation     Most Recent Value  Type of Advance Directive  Out of facility DNR (pink MOST or yellow form)  Pre-existing out of facility DNR order (yellow form or pink MOST form)  Pink MOST form placed in chart (order not valid for inpatient use)  "MOST" Form in Place?  -       TOTAL CRITICAL CARE TIME TAKING CARE OF THIS PATIENT: 54 minutes.    Ihor AustinPavan Garth Diffley M.D on 03/23/2017 at 3:32 AM  Between 7am to 6pm - Pager - [phone]  After 6pm go to [email or web address] - password [email or web address] [facility]  [facility] [place] [facility]  Office  [phone]  CC: Primary care physician; [facility]

## 2017-03-23 NOTE — ED Notes (Addendum)
Date and time results received: 03/23/17 0151 (use smartphrase ".now" to insert current time)  Test: lactic Critical Value: 3.9  Name of Provider Notified: Manson PasseyBrown  Orders Received? Or Actions Taken?: Orders Received - See Orders for details

## 2017-03-23 NOTE — Care Management (Signed)
[facility] Gaylyn RongLaurie Veasey notified of patient re-admission.

## 2017-03-23 NOTE — ED Triage Notes (Signed)
Pt coming from [facility] today via EMS for generalized sickness with elevated WBC and temp. Pt was hospitalized recently.

## 2017-03-23 NOTE — Progress Notes (Signed)
Pharmacy Antibiotic Note  Alvin [name] is a 76 y.o. male admitted on 03/23/2017 with sepsis s/t pneumonia.  Pharmacy has been consulted for vanc/zosyn dosing.  Plan: Patient received vanc 1g IV and zosyn 3.375g IV x 1 in ED  Will continue vanc 1.5g IV q24h w/ 6 hour stack dose. Will draw vanc trough 8/19 @ 0700 prior to 4th dose. Will continue zosyn 3.375g IV q8h w/ extended infusion. Ke 0.0371 T1/2 18 ~ 24 hrs for ease of administration and increased dose to 1.5g Goal trough 15 - 20 mcg/mL   Height: 5\' 8"  (172.7 cm) Weight: 207 lb (93.9 kg) IBW/kg (Calculated) : 68.4  Temp (24hrs), Avg:102.3 F (39.1 C), Min:102.3 F (39.1 C), Max:102.3 F (39.1 C)   Recent Labs Lab 03/23/17 0108 03/23/17 0110  WBC 24.0*  --   CREATININE 1.80*  --   LATICACIDVEN  --  3.9*    Estimated Creatinine Clearance: 39.4 mL/min (A) (by C-G formula based on SCr of 1.8 mg/dL (H)).    No Known Allergies   Thank you for allowing pharmacy to be a part of this patient's care.  Alvin [name] 03/23/2017 2:53 AM

## 2017-03-23 NOTE — Care Management (Signed)
RNCM received call from [name] [facility] stating that they are [facility] for ICU beds and they do not have a step-down unit. Please notify [facility] when [facility] changes to another level of care so they/[facility] can assess bed availability. [facility] is #5 [facility].

## 2017-03-23 NOTE — Care Management (Signed)
This RNCM and Terrilee CroakBrenda Holland RNCM spoke with patient's daughter Alvin [name] by phone [phone] and Alvin [name] would [facility]. [place] [facility] [place] to [facility].

## 2017-03-23 NOTE — Progress Notes (Signed)
Inpatient Diabetes Program Recommendations  AACE/ADA: New Consensus Statement on Inpatient Glycemic Control (2015)  Target Ranges:  Prepandial:   less than 140 mg/dL      [facility] postprandial:   less than 180 mg/dL (1-2 hours)      Critically ill patients:  140 - 180 mg/dL   Lab Results  Component Value Date   GLUCAP 204 (H) 03/23/2017    Diabetes history: None noted Outpatient Diabetes medications: None Inpatient Diabetes Program Recommendations:    Note elevated glucose.  May consider checking A1C and adding Novolog sensitive tid with meals.  Thanks, Beryl MeagerJenny Arlis Yale, RN, BC-ADM Inpatient Diabetes Coordinator Pager [phone] (8a-5p)

## 2017-03-23 NOTE — ED Notes (Signed)
[facility]  [place]  [phone]

## 2017-03-23 NOTE — ED Provider Notes (Signed)
[facility] Emergency Department Provider Note    First MD Initiated Contact with Patient 03/23/17 0050     (approximate)  I have reviewed the triage vital signs and the nursing notes.  Level V caveat;altered mental status HISTORY  Chief Complaint Code Sepsis   HPI Alvin [name] is a 76 y.o. male With below list of chronic medical conditions including recent admission for sepsis returns to the emergency department via EMS with fever. Per EMS staff the facility where the patient resides notified the on-call physician that the patient was had a fever and requested fluids and IV antibiotics however the physician requested that the patient be transferred to the emergency department.   Past Medical History:  Diagnosis Date  . Bipolar disorder (HCC)   . BPH (benign prostatic hyperplasia)   . Cardiomyopathy (HCC)    a. 03/2017 Echo: EF 40%, mild diastolic dysfxn. Mild MR.  . Gout   . 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  . Hyperlipidemia   . Hypertension     Patient Active Problem List   Diagnosis Date Noted  . Subacute delirium 03/07/2017  . Bipolar disorder (HCC) 03/07/2017  . Nonocclusive intestinal infarction (HCC)   . Acute respiratory failure with hypoxemia (HCC)   . Abdominal distention   . Lactic acidosis   . Elevated lipase   . Severe sepsis with septic shock (HCC) 02/28/2017    Past Surgical History:  Procedure Laterality Date  . LAPAROTOMY N/A 03/01/2017   Procedure: EXPLORATORY LAPAROTOMY;  Surgeon: Ancil Linsey, MD;  Location: [facility] ORS;  Service: General;  Laterality: N/A;    Prior to Admission medications   Medication Sig Start Date End Date Taking? Authorizing Provider  allopurinol (ZYLOPRIM) 300 MG tablet Take 300 mg by mouth daily.    [provider]  aspirin 81 MG chewable tablet Chew 1 tablet (81 mg total) by mouth daily. 03/13/17   Shaune Pollack, MD  atorvastatin (LIPITOR) 20 MG tablet Take 20  mg by mouth daily.    [provider]  bisacodyl (DULCOLAX) 5 MG EC tablet Take 1 tablet (5 mg total) by mouth daily as needed for moderate constipation. 03/13/17   Shaune Pollack, MD  carvedilol (COREG) 6.25 MG tablet Take 1 tablet (6.25 mg total) by mouth 2 (two) times daily with a meal. 03/13/17   Shaune Pollack, MD  cholecalciferol (VITAMIN D) 1000 units tablet Take 1,000 Units by mouth daily.    [provider]  divalproex (DEPAKOTE SPRINKLE) 125 MG capsule Take 2 capsules (250 mg total) by mouth every morning. 03/17/17   Shaune Pollack, MD  divalproex (DEPAKOTE SPRINKLE) 125 MG capsule Take 4 capsules (500 mg total) by mouth at bedtime. 03/17/17   Shaune Pollack, MD  lanolin/mineral oil (KERI/THERA-DERM) LOTN Apply 1 application topically as needed for dry skin.    [provider]  lisinopril (PRINIVIL,ZESTRIL) 2.5 MG tablet Take 1 tablet (2.5 mg total) by mouth daily. 03/13/17   Shaune Pollack, MD  MELATONIN PO Take 3 mg by mouth at bedtime.    [provider]  menthol-cetylpyridinium (CEPACOL) 3 MG lozenge Take 1 lozenge by mouth as needed for sore throat.    [provider]  Multiple Vitamins-Minerals (MULTIVITAMINS THER. W/MINERALS) TABS tablet Take 1 tablet by mouth daily.    [provider]  OLANZapine (ZYPREXA) 15 MG tablet Take 15 mg by mouth at bedtime.    [provider]  tamsulosin (FLOMAX) 0.4 MG  CAPS capsule Take 0.4 mg by mouth daily.    [provider]  thiamine (VITAMIN B-1) 100 MG tablet Take 100 mg by mouth daily.    [provider]  traZODone (DESYREL) 50 MG tablet Take 50 mg by mouth at bedtime.    [provider]  vitamin B-12 (CYANOCOBALAMIN) 1000 MCG tablet Take 1,000 mcg by mouth daily.    [provider]    Allergies No known drug allergies No family history on file.  Social History Social History  Substance Use Topics  . Smoking status: Never Smoker  . Smokeless tobacco: Never Used  .  Alcohol use No    Review of Systems Constitutional: positive for fever/chills Eyes: No visual changes. ENT: No sore throat. Cardiovascular: Denies chest pain. Respiratory: Denies shortness of breath. Gastrointestinal: No abdominal pain.  No nausea, no vomiting.  No diarrhea.  No constipation. Genitourinary: Negative for dysuria. Musculoskeletal: Negative for neck pain.  Negative for back pain. Integumentary: Negative for rash. Neurological: Negative for headaches, focal weakness or numbness.  ____________________________________________   PHYSICAL EXAM:  VITAL SIGNS: ED Triage Vitals  Enc Vitals Group     BP 03/23/17 0059 (!) 93/50     Pulse Rate 03/23/17 0059 (!) 102     Resp 03/23/17 0059 19     Temp 03/23/17 0059 (!) 102.3 F (39.1 C)     Temp Source 03/23/17 0059 Rectal     SpO2 03/23/17 0059 94 %     Weight 03/23/17 0059 93.9 kg (207 lb)     Height 03/23/17 0059 1.727 m (5\' 8" )     Head Circumference --      Peak Flow --      Pain Score 03/23/17 0118 0     Pain Loc --      Pain Edu? --      Excl. in GC? --     Constitutional: Alert and  ill appearing and in no acute distress. Eyes: Conjunctivae are normal. Head: Atraumatic. Mouth/Throat: Mucous membranes are moist. Oropharynx non-erythematous. Neck: No stridor.  No meningeal signs.  Cardiovascular: tachycardia, regular rhythm. Good peripheral circulation. Grossly normal heart sounds. Respiratory: Normal respiratory effort.  No retractions. Bibasilar rhonchi Gastrointestinal: Soft and nontender. No distention.  Musculoskeletal: No lower extremity tenderness nor edema. No gross deformities of extremities. Neurologic:  No gross focal neurologic deficits are appreciated.  Skin:  Skin is warm, dry and intact. No rash noted.   ____________________________________________   LABS (all labs ordered are listed, but only abnormal results are displayed)  Labs Reviewed  COMPREHENSIVE METABOLIC PANEL - Abnormal;  Notable for the following:       Result Value   Glucose, Bld 222 (*)    BUN 24 (*)    Creatinine, Ser 1.80 (*)    Calcium 8.2 (*)    Total Protein 5.6 (*)    Albumin 2.2 (*)    AST 105 (*)    ALT 84 (*)    Alkaline Phosphatase 255 (*)    GFR calc non Af Amer 35 (*)    GFR calc Af Amer 41 (*)    All other components within normal limits  CBC WITH DIFFERENTIAL/PLATELET - Abnormal; Notable for the following:    WBC 24.0 (*)    RBC 4.05 (*)    HCT 39.7 (*)    Neutro Abs 20.2 (*)    Monocytes Absolute 2.6 (*)    All other components within normal limits  URINALYSIS, COMPLETE (UACMP) WITH MICROSCOPIC -  Abnormal; Notable for the following:    Color, Urine AMBER (*)    APPearance CLOUDY (*)    Glucose, UA 50 (*)    Hgb urine dipstick MODERATE (*)    Leukocytes, UA LARGE (*)    Bacteria, UA MANY (*)    All other components within normal limits  LACTIC ACID, PLASMA - Abnormal; Notable for the following:    Lactic Acid, Venous 3.9 (*)    All other components within normal limits  CULTURE, BLOOD (ROUTINE X 2)  CULTURE, BLOOD (ROUTINE X 2)  URINE CULTURE  LACTIC ACID, PLASMA   ____________________________________________  EKG  ED ECG REPORT I, [place] BROWN, the attending physician, personally viewed and interpreted this ECG.   Date: 03/23/2017  EKG Time: 1:04 AM  Rate: 101  Rhythm: sinus tachycardia  Axis: normal  Intervals:normal  ST&T Change: None  ____________________________________________  RADIOLOGY I, Dazey Dewayne Shorter, personally viewed and evaluated these images (plain radiographs) as part of my medical decision making, as well as reviewing the written report by the radiologist.  Dg Chest Port 1 View  Result Date: 03/23/2017 CLINICAL DATA:  Acute onset of fever and leukocytosis. Initial encounter. EXAM: PORTABLE CHEST 1 VIEW COMPARISON:  Chest radiograph performed 03/10/2017 FINDINGS: The lungs are well-aerated. Mild left basilar airspace opacity raises  concern for pneumonia. There is no evidence of pleural effusion or pneumothorax. The cardiomediastinal silhouette is borderline normal in size. A pacemaker is noted overlying the left chest wall, with leads ending overlying the right atrium and right ventricle. No acute osseous abnormalities are seen. IMPRESSION: Mild left basilar airspace opacity raises concern for pneumonia. Electronically Signed   By: Roanna Raider M.D.   On: 03/23/2017 01:28    ____________________________________________   PROCEDURES  Critical Care performed: CRITICAL CARE Performed by: Darci Current   Total critical care time: 45 minutes  Critical care time was exclusive of separately billable procedures and treating other patients.  Critical care was necessary to treat or prevent imminent or life-threatening deterioration.  Critical care was time spent personally by me on the following activities: development of treatment plan with patient and/or surrogate as well as nursing, discussions with consultants, evaluation of patient's response to treatment, examination of patient, obtaining history from patient or surrogate, ordering and performing treatments and interventions, ordering and review of laboratory studies, ordering and review of radiographic studies, pulse oximetry and re-evaluation of patient's condition.   Procedures   ____________________________________________   INITIAL IMPRESSION / ASSESSMENT AND PLAN / ED COURSE  Pertinent labs & imaging results that were available during my care of the patient were reviewed by me and considered in my medical decision making (see chart for details).  76 year old male presenting to the emergency department with fever tachycardia and hypotension with concerns for sepsis. Laboratory data revealed leukocytosis with a white blood cell count of 24 urinalysis consist lower lobe pneumonia. Patient did have a Mohs form on arrival which specified no antibiotics or IV  fluids. I spoke with the patient's power of attorney Miss Babette who stated that she never agreed to no IV antibiotics or IV fluids. She stated that she is in agreement for the patient to receive IV antibiotics and IV fluids. She specified that the patient is indeed DO NOT RESUSCITATE stating no resuscitation or mechanical ventilation.Patient discussed with Dr. Tobi Bastos for  Hospital admission for further evaluation and management.      ____________________________________________  FINAL CLINICAL IMPRESSION(S) / ED DIAGNOSES  Final diagnoses:  Septic  shock (HCC)  Acute cystitis without hematuria  Pneumonia of right lower lobe due to infectious organism [facility](HCC)     MEDICATIONS GIVEN DURING THIS VISIT:  Medications  sodium chloride 0.9 % bolus 1,000 mL (1,000 mLs Intravenous New Bag/Given 03/23/17 0119)    And  sodium chloride 0.9 % bolus 1,000 mL (1,000 mLs Intravenous New Bag/Given 03/23/17 0119)    And  sodium chloride 0.9 % bolus 1,000 mL (not administered)  piperacillin-tazobactam (ZOSYN) IVPB 3.375 g (3.375 g Intravenous New Bag/Given 03/23/17 0120)  vancomycin (VANCOCIN) IVPB 1000 mg/200 mL premix (1,000 mg Intravenous New Bag/Given 03/23/17 0122)     NEW OUTPATIENT MEDICATIONS STARTED DURING THIS VISIT:  New Prescriptions   No medications on file    Modified Medications   No medications on file    Discontinued Medications   No medications on file     Note:  This document was prepared using Dragon voice recognition software and may include unintentional dictation errors.    Darci CurrentBrown, [place], MD 03/23/17 [phone]

## 2017-03-23 NOTE — Consult Note (Signed)
PULMONARY / CRITICAL CARE MEDICINE   Name: Jeremian Whitby MRN: [id number] DOB: 1941/01/16    ADMISSION DATE:  03/23/2017   CONSULTATION DATE:  03/22/2017  REFERRING MD:  Dr. Tobi Bastos  REASON: ICU management of septic shock  CHIEF COMPLAINT:  Fever  HISTORY OF PRESENT ILLNESS:   This is a 76 year old Caucasian male, SNF patient who presents to the ED via EMS with complaints of fever. Patient is not a good historian and provides very minimal history. EMS was called because patient was noted to be febrile. Given his recent hospitalization, a skilled nursing facility doctor requested that patient be sent to the emergency room for evaluation. At the ED, patient was febrile, had an elevated lactic acid, elevated WBC, and was hypotensive. A code sepsis was called. Patient is being admitted to the ICU for management of septic shock, and pneumonia. He is awake, denies pain, chest pain, palpitations, nausea and vomiting. Patient was recently discharged on July 24 after hospitalization for severe sepsis secondary to pneumonia  PAST MEDICAL HISTORY :  He  has a past medical history of Bipolar disorder (HCC); BPH (benign prostatic hyperplasia); Cardiomyopathy (HCC); Gout; History of permanent cardiac pacemaker placement; Hyperlipidemia; and Hypertension.  PAST SURGICAL HISTORY: He  has a past surgical history that includes laparotomy (N/A, 03/01/2017).  No Known Allergies  No current facility-administered medications on file prior to encounter.    Current Outpatient Prescriptions on File Prior to Encounter  Medication Sig  . allopurinol (ZYLOPRIM) 300 MG tablet Take 300 mg by mouth daily.  Marland Kitchen aspirin 81 MG chewable tablet Chew 1 tablet (81 mg total) by mouth daily.  Marland Kitchen atorvastatin (LIPITOR) 20 MG tablet Take 20 mg by mouth daily.  . bisacodyl (DULCOLAX) 5 MG EC tablet Take 1 tablet (5 mg total) by mouth daily as needed for moderate constipation.  . carvedilol (COREG) 6.25 MG tablet Take 1 tablet (6.25  mg total) by mouth 2 (two) times daily with a meal.  . cholecalciferol (VITAMIN D) 1000 units tablet Take 1,000 Units by mouth daily.  . divalproex (DEPAKOTE SPRINKLE) 125 MG capsule Take 2 capsules (250 mg total) by mouth every morning.  . divalproex (DEPAKOTE SPRINKLE) 125 MG capsule Take 4 capsules (500 mg total) by mouth at bedtime.  Marland Kitchen lanolin/mineral oil (KERI/THERA-DERM) LOTN Apply 1 application topically as needed for dry skin.  Marland Kitchen lisinopril (PRINIVIL,ZESTRIL) 2.5 MG tablet Take 1 tablet (2.5 mg total) by mouth daily.  Marland Kitchen MELATONIN PO Take 3 mg by mouth at bedtime.  Marland Kitchen menthol-cetylpyridinium (CEPACOL) 3 MG lozenge Take 1 lozenge by mouth as needed for sore throat.  . Multiple Vitamins-Minerals (MULTIVITAMINS THER. W/MINERALS) TABS tablet Take 1 tablet by mouth daily.  Marland Kitchen OLANZapine (ZYPREXA) 15 MG tablet Take 15 mg by mouth at bedtime.  . tamsulosin (FLOMAX) 0.4 MG CAPS capsule Take 0.4 mg by mouth daily.  Marland Kitchen thiamine (VITAMIN B-1) 100 MG tablet Take 100 mg by mouth daily.  . traZODone (DESYREL) 50 MG tablet Take 50 mg by mouth at bedtime.  . vitamin B-12 (CYANOCOBALAMIN) 1000 MCG tablet Take 1,000 mcg by mouth daily.    FAMILY HISTORY:  His indicated that his mother is deceased. He indicated that his father is deceased.    SOCIAL HISTORY: He  reports that he has never smoked. He has never used smokeless tobacco. He reports that he does not drink alcohol or use drugs.  REVIEW OF SYSTEMS:   Constitutional: Positive for fever and generalized malaise.  HENT: Negative for congestion and  rhinorrhea.  Eyes: Negative for redness and visual disturbance.  Respiratory: Negative for shortness of breath and wheezing.  Cardiovascular: Negative for chest pain and palpitations.  Gastrointestinal: Negative  for nausea , vomiting and abdominal pain and  loose stools Genitourinary: Negative for dysuria and urgency; incontinent of urine Endocrine: Denies polyuria, polyphagia and heat  intolerance Musculoskeletal: Negative for myalgias and arthralgias.  Skin: Negative for pallor and wound.  Neurological: Negative for dizziness and headaches   SUBJECTIVE:   VITAL SIGNS: BP (!) 79/56   Pulse 87   Temp (!) 101.1 F (38.4 C) (Rectal)   Resp (!) 30   Ht 5\' 8"  (1.727 m)   Wt 192 lb 0.3 oz (87.1 kg)   SpO2 94%   BMI 29.20 kg/m   HEMODYNAMICS:    VENTILATOR SETTINGS:    INTAKE / OUTPUT: No intake/output data recorded.  PHYSICAL EXAMINATION: General: Awake, no acute distress Neuro:  A and oriented 2, moves all extremities, speech is normal HEENT: PERRLA, oral mucosa dry Cardiovascular: Apical pulse regular, S1, S2, no murmur, regurg or gallop,   Lungs: Normal WOB, bilateral breath sounds, diminished in the bases, no whweezing     Abdomen:  Non-distended, normal bowel sounds Musculoskeletal: +rom, no deformities Skin: warm and dry  LABS:  BMET  Recent Labs Lab 03/23/17 0108  NA 137  K 4.8  CL 104  CO2 22  BUN 24*  CREATININE 1.80*  GLUCOSE 222*    Electrolytes  Recent Labs Lab 03/23/17 0108  CALCIUM 8.2*    CBC  Recent Labs Lab 03/23/17 0108  WBC 24.0*  HGB 13.4  HCT 39.7*  PLT 204    Coag's No results for input(s): APTT, INR in the last 168 hours.  Sepsis Markers  Recent Labs Lab 03/23/17 0110  LATICACIDVEN 3.9*    ABG No results for input(s): PHART, PCO2ART, PO2ART in the last 168 hours.  Liver Enzymes  Recent Labs Lab 03/23/17 0108  AST 105*  ALT 84*  ALKPHOS 255*  BILITOT 0.8  ALBUMIN 2.2*    Cardiac Enzymes No results for input(s): TROPONINI, PROBNP in the last 168 hours.  Glucose No results for input(s): GLUCAP in the last 168 hours.  Imaging Dg Chest Port 1 View  Result Date: 03/23/2017 CLINICAL DATA:  Acute onset of fever and leukocytosis. Initial encounter. EXAM: PORTABLE CHEST 1 VIEW COMPARISON:  Chest radiograph performed 03/10/2017 FINDINGS: The lungs are well-aerated. Mild left basilar  airspace opacity raises concern for pneumonia. There is no evidence of pleural effusion or pneumothorax. The cardiomediastinal silhouette is borderline normal in size. A pacemaker is noted overlying the left chest wall, with leads ending overlying the right atrium and right ventricle. No acute osseous abnormalities are seen. IMPRESSION: Mild left basilar airspace opacity raises concern for pneumonia. Electronically Signed   By: Roanna Raider M.D.   On: 03/23/2017 01:28     STUDIES:  2-D echo done 03/08/2017: LV EF: 40%  CULTURES: Blood cultures 2 Urine culture  ANTIBIOTICS: Vancomycin 03/22/2017  Zosyn 03/22/2017  SIGNIFICANT EVENTS: 07/24: Discharge after hospitalization for sepsis secondary to pneumonia 03/22/2017: Readmitted for sepsis and pneumonia  LINES/TUBES: Peripheral IVs Condom catheter  DISCUSSION: This is a 76 year old Caucasian male presenting with healthcare associated pneumonia, urinary tract infection sepsis and septic shock  ASSESSMENT  Septic shock. Healthcare associated pneumonia Sepsis secondary to pneumonia Urinary tract infection Metabolic encephalopathy History of cardiomyopathy, status post pacemaker placement. History of hypertension and hyperlipidemia PLAN Hemodynamics per ICU protocol. Antibiotics as above.  Follow-up cultures IV fluids Pressors to maintain mean arterial blood pressure greater than 65 Trend pro-calcitonin and adjust antibiotics accordingly Monitor mental status closely Supplemental oxygen as needed Monitor fever curve Tylenol when necessary for fever GI and DVT prophylaxis  FAMILY  - Updates: No family at bedside. We'll updated when available  - Inter-disciplinary family meet or Palliative Care meeting due by:  day 7   [facility] ANP-BC Pulmonary and Critical Care Medicine [facility] Pager [phone] or [phone] 03/23/2017, 5:02 AM   STAFF NOTE: I. Dr. Nicholos Johnsamachandran, have personally reviewed  the patient's available data including medical history , events of notes, physican examination and test results as part of my evaluation. I have discussed with the  Care with the NP and other care providers including  pharmacist, ICU RN, RRT, dietary.  Physical Exam Lungs - Decreased air entry bilaterally.    [age] year old male, admitting from nursing home, with fevers. In the emergency department. It was noted that he was significantly hypotensive, he was started on pressors, admitted to the intensive care unit. I personally reviewed his chest x-ray, this shows chronic bibasilar atelectasis, left greater than right. Currently, he does not have any evidence of respiratory distress, or hypoxia. Lactic acid was elevated at the time of admission at 3.9,now down to 3.6, he is received 3 units of IV fluids, he has leukocytosis with a WBC count of 24. UA reveals large leukocytes, otherwise unremarkable. Blood cultures are negative thus far, MRSA screen negative.  Given the paucity of respiratory findings, pneumonia seems to be an unlikely cause of this degree of sepsis.we will do a bowel-up x-ray,to look for other evidence of sepsis, and check a pro-calcitonin level.  Wells Guileseep Alahia Whicker, MD.   Board Certified in Internal Medicine, Pulmonary Medicine, Critical Care Medicine, and Sleep Medicine.  [facility] Office Number: [phone] Pager: [phone]  Santiago Gladavid Kasa, M.D.  Billy Fischeravid Simonds, M.D

## 2017-03-23 NOTE — Progress Notes (Signed)
Report was given to [facility], pt vital signs WDL and he was stable, alert. Assessment done by [facility], handed off papers and pt transported.

## 2017-03-24 NOTE — Care Management (Signed)
Post discharge: Discharge summary faxed to [facility] with [facility].

## 2017-03-25 LAB — URINE CULTURE: Culture: >=100000 — AB

## 2017-03-28 LAB — CULTURE, BLOOD (ROUTINE X 2)
CULTURE: NO GROWTH
Culture: NO GROWTH

## 2017-03-30 NOTE — Care Management (Signed)
Post discharge note 03/30/17 1300: This RNCM received call from [name] with [facility] requesting updated any thoracic xrays/studies we obtained for his PCP to review. I have faxed what is on file and requested CD from [facility] radiology department to be mailed to [facility] for review. I was told it would be mailed out in AM (03/31/17).

## 2018-03-17 ENCOUNTER — Emergency Department: Payer: Medicare Other

## 2018-03-17 ENCOUNTER — Inpatient Hospital Stay
Admission: EM | Admit: 2018-03-17 | Discharge: 2018-03-19 | DRG: 872 | Disposition: A | Discharge status: Home Health | Payer: Medicare Other | Attending: Internal Medicine | Admitting: Internal Medicine

## 2018-03-17 ENCOUNTER — Other Ambulatory Visit: Payer: Self-pay

## 2018-03-17 ENCOUNTER — Encounter: Payer: Self-pay | Admitting: Emergency Medicine

## 2018-03-17 DIAGNOSIS — K219 Gastro-esophageal reflux disease without esophagitis: Secondary | ICD-10-CM | POA: Diagnosis present

## 2018-03-17 DIAGNOSIS — Z950 Presence of cardiac pacemaker: Secondary | ICD-10-CM

## 2018-03-17 DIAGNOSIS — E785 Hyperlipidemia, unspecified: Secondary | ICD-10-CM | POA: Diagnosis present

## 2018-03-17 DIAGNOSIS — I959 Hypotension, unspecified: Secondary | ICD-10-CM

## 2018-03-17 DIAGNOSIS — Z823 Family history of stroke: Secondary | ICD-10-CM | POA: Diagnosis not present

## 2018-03-17 DIAGNOSIS — E860 Dehydration: Secondary | ICD-10-CM | POA: Diagnosis present

## 2018-03-17 DIAGNOSIS — I110 Hypertensive heart disease with heart failure: Secondary | ICD-10-CM | POA: Diagnosis present

## 2018-03-17 DIAGNOSIS — I429 Cardiomyopathy, unspecified: Secondary | ICD-10-CM | POA: Diagnosis present

## 2018-03-17 DIAGNOSIS — M109 Gout, unspecified: Secondary | ICD-10-CM | POA: Diagnosis present

## 2018-03-17 DIAGNOSIS — I5022 Chronic systolic (congestive) heart failure: Secondary | ICD-10-CM | POA: Diagnosis present

## 2018-03-17 DIAGNOSIS — Z66 Do not resuscitate: Secondary | ICD-10-CM | POA: Diagnosis present

## 2018-03-17 DIAGNOSIS — A419 Sepsis, unspecified organism: Secondary | ICD-10-CM | POA: Diagnosis present

## 2018-03-17 DIAGNOSIS — E119 Type 2 diabetes mellitus without complications: Secondary | ICD-10-CM | POA: Diagnosis present

## 2018-03-17 DIAGNOSIS — R7989 Other specified abnormal findings of blood chemistry: Secondary | ICD-10-CM

## 2018-03-17 DIAGNOSIS — F319 Bipolar disorder, unspecified: Secondary | ICD-10-CM | POA: Diagnosis present

## 2018-03-17 DIAGNOSIS — K529 Noninfective gastroenteritis and colitis, unspecified: Secondary | ICD-10-CM | POA: Diagnosis present

## 2018-03-17 DIAGNOSIS — N400 Enlarged prostate without lower urinary tract symptoms: Secondary | ICD-10-CM | POA: Diagnosis present

## 2018-03-17 HISTORY — DX: Type 2 diabetes mellitus without complications: E11.9

## 2018-03-17 LAB — COMPREHENSIVE METABOLIC PANEL
ALBUMIN: 3.1 g/dL — AB (ref 3.5–5.0)
ALT: 22 U/L (ref 0–44)
AST: 46 U/L — AB (ref 15–41)
Alkaline Phosphatase: 57 U/L (ref 38–126)
Anion gap: 14 (ref 5–15)
BUN: 33 mg/dL — AB (ref 8–23)
CHLORIDE: 102 mmol/L (ref 98–111)
CO2: 17 mmol/L — AB (ref 22–32)
Calcium: 8.5 mg/dL — ABNORMAL LOW (ref 8.9–10.3)
Creatinine, Ser: 1.81 mg/dL — ABNORMAL HIGH (ref 0.61–1.24)
GFR calc Af Amer: 40 mL/min — ABNORMAL LOW (ref >60)
GFR calc non Af Amer: 35 mL/min — ABNORMAL LOW (ref >60)
GLUCOSE: 192 mg/dL — AB (ref 70–99)
POTASSIUM: 3.9 mmol/L (ref 3.5–5.1)
SODIUM: 133 mmol/L — AB (ref 135–145)
TOTAL PROTEIN: 6.1 g/dL — AB (ref 6.5–8.1)
Total Bilirubin: 1 mg/dL (ref 0.3–1.2)

## 2018-03-17 LAB — CBC WITH DIFFERENTIAL/PLATELET
BASOS ABS: 0.1 10*3/uL (ref 0–0.1)
Basophils Relative: 1 %
EOS PCT: 0 %
Eosinophils Absolute: 0 10*3/uL (ref 0–0.7)
HCT: 43.5 % (ref 40.0–52.0)
Hemoglobin: 14.8 g/dL (ref 13.0–18.0)
LYMPHS ABS: 1.4 10*3/uL (ref 1.0–3.6)
LYMPHS PCT: 11 %
MCH: 32.9 pg (ref 26.0–34.0)
MCHC: 34 g/dL (ref 32.0–36.0)
MCV: 96.8 fL (ref 80.0–100.0)
Monocytes Absolute: 1.7 10*3/uL — ABNORMAL HIGH (ref 0.2–1.0)
Monocytes Relative: 14 %
NEUTROS PCT: 74 %
Neutro Abs: 9.1 10*3/uL — ABNORMAL HIGH (ref 1.4–6.5)
PLATELETS: 181 10*3/uL (ref 150–440)
RBC: 4.5 MIL/uL (ref 4.40–5.90)
RDW: 14.4 % (ref 11.5–14.5)
WBC: 12.3 10*3/uL — AB (ref 3.8–10.6)

## 2018-03-17 LAB — URINALYSIS, COMPLETE (UACMP) WITH MICROSCOPIC
BILIRUBIN URINE: NEGATIVE
Bacteria, UA: NONE SEEN
GLUCOSE, UA: 50 mg/dL — AB
HGB URINE DIPSTICK: NEGATIVE
Ketones, ur: NEGATIVE mg/dL
Leukocytes, UA: NEGATIVE
NITRITE: NEGATIVE
Protein, ur: NEGATIVE mg/dL
SPECIFIC GRAVITY, URINE: 1.02 (ref 1.005–1.030)
Squamous Epithelial / HPF: NONE SEEN (ref 0–5)
pH: 5 (ref 5.0–8.0)

## 2018-03-17 LAB — GASTROINTESTINAL PANEL BY PCR, STOOL (REPLACES STOOL CULTURE)
Adenovirus F40/41: NOT DETECTED
Astrovirus: NOT DETECTED
CRYPTOSPORIDIUM: NOT DETECTED
Campylobacter species: NOT DETECTED
Cyclospora cayetanensis: NOT DETECTED
ENTEROPATHOGENIC E COLI (EPEC): NOT DETECTED
Entamoeba histolytica: NOT DETECTED
Enteroaggregative E coli (EAEC): NOT DETECTED
Enterotoxigenic E coli (ETEC): NOT DETECTED
Giardia lamblia: NOT DETECTED
Norovirus GI/GII: NOT DETECTED
Plesimonas shigelloides: NOT DETECTED
ROTAVIRUS A: NOT DETECTED
SHIGELLA/ENTEROINVASIVE E COLI (EIEC): NOT DETECTED
Salmonella species: NOT DETECTED
Sapovirus (I, II, IV, and V): NOT DETECTED
Shiga like toxin producing E coli (STEC): NOT DETECTED
Vibrio cholerae: NOT DETECTED
Vibrio species: NOT DETECTED
YERSINIA ENTEROCOLITICA: NOT DETECTED

## 2018-03-17 LAB — C DIFFICILE QUICK SCREEN W PCR REFLEX
C DIFFICILE (CDIFF) INTERP: NOT DETECTED
C DIFFICILE (CDIFF) TOXIN: NEGATIVE
C Diff antigen: NEGATIVE

## 2018-03-17 LAB — ETHANOL: Alcohol, Ethyl (B): 12 mg/dL — ABNORMAL HIGH (ref <10)

## 2018-03-17 LAB — TROPONIN I: TROPONIN I: 0.03 ng/mL — AB (ref <0.03)

## 2018-03-17 LAB — GLUCOSE, CAPILLARY
GLUCOSE-CAPILLARY: 129 mg/dL — AB (ref 70–99)
GLUCOSE-CAPILLARY: 159 mg/dL — AB (ref 70–99)

## 2018-03-17 LAB — LIPASE, BLOOD: Lipase: 23 U/L (ref 11–51)

## 2018-03-17 LAB — TYPE AND SCREEN
ABO/RH(D): O POS
ANTIBODY SCREEN: NEGATIVE

## 2018-03-17 LAB — LACTIC ACID, PLASMA
Lactic Acid, Venous: 5.8 mmol/L (ref 0.5–1.9)
Lactic Acid, Venous: 2 mmol/L (ref 0.5–1.9)

## 2018-03-17 LAB — PROTIME-INR
INR: 1.31
PROTHROMBIN TIME: 16.2 s — AB (ref 11.4–15.2)

## 2018-03-17 MED ORDER — INSULIN ASPART 100 UNIT/ML ~~LOC~~ SOLN
0.0000 [IU] | Freq: Three times a day (TID) | SUBCUTANEOUS | Status: DC
  Administered 2018-03-18 (×3): 1 [IU] via SUBCUTANEOUS
  Administered 2018-03-19: 2 [IU] via SUBCUTANEOUS
  Administered 2018-03-19: 1 [IU] via SUBCUTANEOUS
  Filled 2018-03-17 (×5): qty 1

## 2018-03-17 MED ORDER — FENOFIBRATE 54 MG PO TABS
54.0000 mg | ORAL_TABLET | Freq: Every day | ORAL | Status: DC
  Administered 2018-03-17 – 2018-03-19 (×3): 54 mg via ORAL
  Filled 2018-03-17 (×3): qty 1

## 2018-03-17 MED ORDER — SODIUM CHLORIDE 0.9 % IV BOLUS (SEPSIS)
1500.0000 mL | Freq: Once | INTRAVENOUS | Status: AC
  Administered 2018-03-17: 1500 mL via INTRAVENOUS

## 2018-03-17 MED ORDER — INSULIN ASPART 100 UNIT/ML ~~LOC~~ SOLN: 0.0000 [IU] | Freq: Every day | SUBCUTANEOUS | Status: DC

## 2018-03-17 MED ORDER — VITAMIN B-1 100 MG PO TABS
100.0000 mg | ORAL_TABLET | Freq: Every day | ORAL | Status: DC
  Administered 2018-03-18 – 2018-03-19 (×2): 100 mg via ORAL
  Filled 2018-03-17 (×2): qty 1

## 2018-03-17 MED ORDER — SODIUM CHLORIDE 0.9 % IV SOLN
2.0000 g | INTRAVENOUS | Status: AC
  Administered 2018-03-17: 2 g via INTRAVENOUS
  Filled 2018-03-17: qty 2

## 2018-03-17 MED ORDER — ACETAMINOPHEN 325 MG PO TABS: 650.0000 mg | ORAL_TABLET | Freq: Four times a day (QID) | ORAL | Status: DC | PRN

## 2018-03-17 MED ORDER — METRONIDAZOLE IN NACL 5-0.79 MG/ML-% IV SOLN
500.0000 mg | Freq: Once | INTRAVENOUS | Status: AC
  Administered 2018-03-17: 500 mg via INTRAVENOUS
  Filled 2018-03-17: qty 100

## 2018-03-17 MED ORDER — ALLOPURINOL 100 MG PO TABS
100.0000 mg | ORAL_TABLET | Freq: Every day | ORAL | Status: DC
  Administered 2018-03-17 – 2018-03-19 (×3): 100 mg via ORAL
  Filled 2018-03-17 (×3): qty 1

## 2018-03-17 MED ORDER — ADULT MULTIVITAMIN W/MINERALS CH
1.0000 | ORAL_TABLET | Freq: Every day | ORAL | Status: DC
  Administered 2018-03-18 – 2018-03-19 (×2): 1 via ORAL
  Filled 2018-03-17 (×2): qty 1

## 2018-03-17 MED ORDER — HEPARIN SODIUM (PORCINE) 5000 UNIT/ML IJ SOLN
5000.0000 [IU] | Freq: Three times a day (TID) | INTRAMUSCULAR | Status: DC
  Administered 2018-03-17 – 2018-03-19 (×6): 5000 [IU] via SUBCUTANEOUS
  Filled 2018-03-17 (×6): qty 1

## 2018-03-17 MED ORDER — ONDANSETRON HCL 4 MG PO TABS: 4.0000 mg | ORAL_TABLET | Freq: Four times a day (QID) | ORAL | Status: DC | PRN

## 2018-03-17 MED ORDER — ASPIRIN 81 MG PO CHEW
81.0000 mg | CHEWABLE_TABLET | Freq: Every day | ORAL | Status: DC
  Administered 2018-03-18 – 2018-03-19 (×2): 81 mg via ORAL
  Filled 2018-03-17 (×2): qty 1

## 2018-03-17 MED ORDER — SODIUM CHLORIDE 0.9 % IV BOLUS
1000.0000 mL | Freq: Once | INTRAVENOUS | Status: AC
  Administered 2018-03-17: 1000 mL via INTRAVENOUS

## 2018-03-17 MED ORDER — OLANZAPINE 10 MG PO TABS
10.0000 mg | ORAL_TABLET | Freq: Every day | ORAL | Status: DC
  Administered 2018-03-17 – 2018-03-18 (×2): 10 mg via ORAL
  Filled 2018-03-17 (×3): qty 1

## 2018-03-17 MED ORDER — SODIUM CHLORIDE 0.9 % IV SOLN
INTRAVENOUS | Status: DC
  Administered 2018-03-17 – 2018-03-19 (×4): via INTRAVENOUS

## 2018-03-17 MED ORDER — ONDANSETRON HCL 4 MG/2ML IJ SOLN: 4.0000 mg | Freq: Four times a day (QID) | INTRAMUSCULAR | Status: DC | PRN

## 2018-03-17 MED ORDER — IOPAMIDOL (ISOVUE-370) INJECTION 76%
75.0000 mL | Freq: Once | INTRAVENOUS | Status: AC | PRN
  Administered 2018-03-17: 75 mL via INTRAVENOUS

## 2018-03-17 MED ORDER — PANTOPRAZOLE SODIUM 40 MG PO TBEC
40.0000 mg | DELAYED_RELEASE_TABLET | Freq: Every day | ORAL | Status: DC
  Administered 2018-03-18 – 2018-03-19 (×2): 40 mg via ORAL
  Filled 2018-03-17 (×2): qty 1

## 2018-03-17 MED ORDER — VITAMIN D 1000 UNITS PO TABS
2000.0000 [IU] | ORAL_TABLET | Freq: Every day | ORAL | Status: DC
  Administered 2018-03-18 – 2018-03-19 (×2): 2000 [IU] via ORAL
  Filled 2018-03-17 (×2): qty 2

## 2018-03-17 MED ORDER — VITAMIN B-12 1000 MCG PO TABS
1000.0000 ug | ORAL_TABLET | Freq: Every day | ORAL | Status: DC
  Administered 2018-03-18 – 2018-03-19 (×2): 1000 ug via ORAL
  Filled 2018-03-17 (×2): qty 1

## 2018-03-17 MED ORDER — VANCOMYCIN HCL IN DEXTROSE 1-5 GM/200ML-% IV SOLN
1000.0000 mg | Freq: Once | INTRAVENOUS | Status: AC
  Administered 2018-03-17: 1000 mg via INTRAVENOUS
  Filled 2018-03-17: qty 200

## 2018-03-17 MED ORDER — ACETAMINOPHEN 650 MG RE SUPP: 650.0000 mg | Freq: Four times a day (QID) | RECTAL | Status: DC | PRN

## 2018-03-17 NOTE — ED Notes (Signed)
500 mL bolus of fluid started by EMS

## 2018-03-17 NOTE — ED Notes (Addendum)
Date and time results received: 03/17/18 12:16 PM   Test: Lactic Acid Critical Value: 2.0 mmol/L  Name of Provider Notified: Dr. Scotty CourtStafford

## 2018-03-17 NOTE — Progress Notes (Signed)
CODE SEPSIS - PHARMACY COMMUNICATION ? **Broad Spectrum Antibiotics should be administered within 1 hour of Sepsis diagnosis** Time Code Sepsis Called/Page Received: ~8:50  Antibiotics Ordered: Cefepime + Flagyl Time of 1st antibiotic administration: 0930   Mauri ReadingSavanna M Martin, PharmD Pharmacy Resident  03/17/2018 9:33 AM

## 2018-03-17 NOTE — ED Provider Notes (Signed)
[facility] Emergency Department Provider Note  ____________________________________________  Time seen: Approximately 12:44 PM  I have reviewed the triage vital signs and the nursing notes.   HISTORY  Chief Complaint Weakness    HPI Alvin [name] is a 77 y.o. male with a history of hypertension hyperlipidemia diabetes and systolic heart failure .  Patient reports that he has had diarrhea for the last 3 days, and last night , due to generalized weakness he had a fall.  Denies hitting his head or loss of consciousness.  This morning he was again feeling weak but without focal symptoms when he went to the bathroom to have a bowel movement.  Immediately afterward he became lightheaded and diaphoretic and nearly passed out.  No fall today.  Symptoms are constant and severe.  Worse standing upright, better lying down in bed.  Denies any chest pain shortness of breath or abdominal pain.  EMS report that they were unable to obtain a blood pressure and that the patient was pale, sweaty, thready pulses on their arrival.    Past Medical History:  Diagnosis Date  . Bipolar disorder (HCC)   . BPH (benign prostatic hyperplasia)   . Cardiomyopathy (HCC)    a. 03/2017 Echo: EF 40%, mild diastolic dysfxn. Mild MR.  . Diabetes mellitus without complication (HCC)   . Gout   . 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  . Hyperlipidemia   . Hypertension      Patient Active Problem List   Diagnosis Date Noted  . Sepsis (HCC) 03/23/2017  . Subacute delirium 03/07/2017  . Bipolar disorder (HCC) 03/07/2017  . Nonocclusive intestinal infarction (HCC)   . Acute respiratory failure with hypoxemia (HCC)   . Abdominal distention   . Lactic acidosis   . Elevated lipase   . Severe sepsis with septic shock (HCC) 02/28/2017     Past Surgical History:  Procedure Laterality Date  . LAPAROTOMY N/A 03/01/2017   Procedure: EXPLORATORY LAPAROTOMY;  Surgeon:  Ancil Linsey, MD;  Location: [facility] ORS;  Service: General;  Laterality: N/A;     Prior to Admission medications   Medication Sig Start Date End Date Taking? Authorizing Provider  allopurinol (ZYLOPRIM) 100 MG tablet Take 100 mg by mouth daily.   Yes [provider]  aspirin 81 MG chewable tablet Chew 1 tablet (81 mg total) by mouth daily. 03/24/17  Yes Eugenie Norrie, NP  cholecalciferol (VITAMIN D) 1000 units tablet Take 2,000 Units by mouth daily.   Yes [provider]  fenofibrate (TRICOR) 145 MG tablet Take 145 mg by mouth daily.   Yes [provider]  lisinopril (PRINIVIL,ZESTRIL) 5 MG tablet Take 5 mg by mouth daily.   Yes [provider]  metFORMIN (GLUCOPHAGE) 500 MG tablet Take 500 mg by mouth 2 (two) times daily.   Yes [provider]  Multiple Vitamins-Minerals (MULTIVITAMINS THER. W/MINERALS) TABS tablet Take 1 tablet by mouth daily.   Yes [provider]  OLANZapine (ZYPREXA) 10 MG tablet Take 10 mg by mouth at bedtime.   Yes [provider]  omeprazole (PRILOSEC) 20 MG capsule Take 20 mg by mouth every morning.   Yes [provider]  ondansetron (ZOFRAN) 4 MG tablet Take 1 tablet (4 mg total) by mouth every 6 (six) hours as needed for nausea. Patient taking differently: Take 4 mg by mouth every 6 (six) hours as needed.  03/23/17  Yes Eugenie Norrie, NP  polyethylene  glycol (MIRALAX / GLYCOLAX) packet Take 17 g by mouth daily as needed for mild constipation or moderate constipation.   Yes [provider]  terazosin (HYTRIN) 5 MG capsule Take 5 mg by mouth at bedtime.   Yes [provider]  thiamine 100 MG tablet Take 1 tablet (100 mg total) by mouth daily. 03/24/17  Yes Eugenie Norrie, NP  vitamin B-12 1000 MCG tablet Take 1 tablet (1,000 mcg total) by mouth daily. 03/24/17  Yes Eugenie Norrie, NP  atorvastatin (LIPITOR) 20 MG tablet Take 1 tablet (20 mg total) by mouth daily. Patient  not taking: Reported on 03/17/2018 03/24/17   Eugenie Norrie, NP  divalproex (DEPAKOTE SPRINKLE) 125 MG capsule Take 2 capsules (250 mg total) by mouth every morning. Patient not taking: Reported on 03/17/2018 03/24/17   Eugenie Norrie, NP  divalproex (DEPAKOTE SPRINKLE) 125 MG capsule Take 4 capsules (500 mg total) by mouth at bedtime. Patient not taking: Reported on 03/17/2018 03/23/17   Eugenie Norrie, NP  Melatonin 5 MG TABS Take 1 tablet (5 mg total) by mouth at bedtime. Patient not taking: Reported on 03/17/2018 03/23/17   Eugenie Norrie, NP  OLANZapine (ZYPREXA) 15 MG tablet Take 1 tablet (15 mg total) by mouth at bedtime. Patient not taking: Reported on 03/17/2018 03/23/17   Eugenie Norrie, NP  tamsulosin (FLOMAX) 0.4 MG CAPS capsule Take 1 capsule (0.4 mg total) by mouth daily. Patient not taking: Reported on 03/17/2018 03/24/17   Eugenie Norrie, NP  traZODone (DESYREL) 50 MG tablet Take 1 tablet (50 mg total) by mouth at bedtime. Patient not taking: Reported on 03/17/2018 03/23/17   Eugenie Norrie, NP     Allergies Patient has no known allergies.   No family history on file.  Social History Social History   Tobacco Use  . Smoking status: Never Smoker  . Smokeless tobacco: Never Used  Substance Use Topics  . Alcohol use: No  . Drug use: No    Review of Systems  Constitutional:   No fever or chills.   Cardiovascular:   No chest pain or syncope. Respiratory:   No dyspnea or cough. Gastrointestinal:   Negative for abdominal pain, positive diarrhea Musculoskeletal:   Negative for focal pain or swelling All other systems reviewed and are negative except as documented above in ROS and HPI.  ____________________________________________   PHYSICAL EXAM:  VITAL SIGNS: ED Triage Vitals  Enc Vitals Group     BP 03/17/18 0748 100/75     Pulse Rate 03/17/18 0748 (!) 107     Resp 03/17/18 0748 20     Temp 03/17/18 0750 98.2 F (36.8 C)     Temp Source 03/17/18 0748  Oral     SpO2 03/17/18 0748 94 %     Weight 03/17/18 0749 197 lb 1.6 oz (89.4 kg)     Height --      Head Circumference --      Peak Flow --      Pain Score 03/17/18 0749 0     Pain Loc --      Pain Edu? --      Excl. in GC? --     Vital signs reviewed, nursing assessments reviewed.   Constitutional:   Alert and oriented. Non-toxic appearance. Eyes:   Conjunctivae are normal. EOMI. PERRL.  anisocoria, chronic per patient ENT      Head:   Normocephalic and atraumatic.      Nose:  No congestion/rhinnorhea.       Mouth/Throat:   Dry mucous membranes, no pharyngeal erythema. No peritonsillar mass.       Neck:   No meningismus. Full ROM. Hematological/Lymphatic/Immunilogical:   No cervical lymphadenopathy. Cardiovascular:   Tachycardia heart rate 110. Symmetric bilateral radial and DP pulses.  No murmurs. Cap refill less than 2 seconds. Respiratory:   Normal respiratory effort without tachypnea/retractions. Breath sounds are clear and equal bilaterally. No wheezes/rales/rhonchi. Gastrointestinal:   Soft and nontender.  Moderately distended. There is no CVA tenderness.  No rebound, rigidity, or guarding.  Rectal exam performed with nurse at bedside.  Liquid brown stool.   Hemoccult positive.  Genitourinary:   Normal Musculoskeletal:   Normal range of motion in all extremities. No joint effusions.  No lower extremity tenderness.  No edema. Neurologic:   Normal speech and language.  Motor grossly intact. No acute focal neurologic deficits are appreciated.  Skin:    Skin is warm, dry and intact. No rash noted.  No petechiae, purpura, or bullae.  ____________________________________________    LABS (pertinent positives/negatives) (all labs ordered are listed, but only abnormal results are displayed) Labs Reviewed  COMPREHENSIVE METABOLIC PANEL - Abnormal; Notable for the following components:      Result Value   Sodium 133 (*)    CO2 17 (*)    Glucose, Bld 192 (*)    BUN 33 (*)     Creatinine, Ser 1.81 (*)    Calcium 8.5 (*)    Total Protein 6.1 (*)    Albumin 3.1 (*)    AST 46 (*)    GFR calc non Af Amer 35 (*)    GFR calc Af Amer 40 (*)    All other components within normal limits  ETHANOL - Abnormal; Notable for the following components:   Alcohol, Ethyl (B) 12 (*)    All other components within normal limits  TROPONIN I - Abnormal; Notable for the following components:   Troponin I 0.03 (*)    All other components within normal limits  CBC WITH DIFFERENTIAL/PLATELET - Abnormal; Notable for the following components:   WBC 12.3 (*)    Neutro Abs 9.1 (*)    Monocytes Absolute 1.7 (*)    All other components within normal limits  PROTIME-INR - Abnormal; Notable for the following components:   Prothrombin Time 16.2 (*)    All other components within normal limits  URINALYSIS, COMPLETE (UACMP) WITH MICROSCOPIC - Abnormal; Notable for the following components:   Color, Urine YELLOW (*)    APPearance CLEAR (*)    Glucose, UA 50 (*)    All other components within normal limits  LACTIC ACID, PLASMA - Abnormal; Notable for the following components:   Lactic Acid, Venous 5.8 (*)    All other components within normal limits  LACTIC ACID, PLASMA - Abnormal; Notable for the following components:   Lactic Acid, Venous 2.0 (*)    All other components within normal limits  URINE CULTURE  GASTROINTESTINAL PANEL BY PCR, STOOL (REPLACES STOOL CULTURE)  C DIFFICILE QUICK SCREEN W PCR REFLEX  CULTURE, BLOOD (ROUTINE X 2)  CULTURE, BLOOD (ROUTINE X 2)  LIPASE, BLOOD  TYPE AND SCREEN   ____________________________________________   EKG  Operative by me Atrial sensed, ventricular paced rhythm, rate of 107, left axis, left bundle branch block.  No acute ischemic changes.  ____________________________________________    RADIOLOGY  Dg Chest Portable 1 View  Result Date: 03/17/2018 CLINICAL DATA:  Generalized weakness. EXAM:  PORTABLE CHEST 1 VIEW COMPARISON:   03/23/2017 FINDINGS: There is no focal parenchymal opacity. There is no pleural effusion or pneumothorax. The heart and mediastinal contours are unremarkable. There is a dual lead cardiac pacemaker present. The osseous structures are unremarkable. IMPRESSION: No active disease. Electronically Signed   By: Elige Ko   On: 03/17/2018 08:33   Ct Angio Abd/pel W And/or Wo Contrast  Result Date: 03/17/2018 CLINICAL DATA:  diaphoretic, and pale. Pt reports generalized weakness. Per EMS report upon on their arrival pt was diaphoretic and they were unable to palpate radial pulses, EMS was unable to obtain a blood pressure. CBG was 161. Pt has pacemarker in place.Pt sister reports pt had a fall last night, pt told EMS that his "legs gave out". EXAM: CTA ABDOMEN AND PELVIS WITH CONTRAST TECHNIQUE: Multidetector CT imaging of the abdomen and pelvis was performed using the standard protocol during bolus administration of intravenous contrast. Multiplanar reconstructed images and MIPs were obtained and reviewed to evaluate the vascular anatomy. CONTRAST:  75mL ISOVUE-370 IOPAMIDOL (ISOVUE-370) INJECTION 76% COMPARISON:  03/01/2017 FINDINGS: VASCULAR Aorta: Moderate partially calcified atheromatous plaque in the infrarenal segment. No aneurysm, dissection, or stenosis. Celiac: Patent without evidence of aneurysm, dissection, vasculitis or significant stenosis. SMA: Patent without evidence of aneurysm, dissection, vasculitis or significant stenosis. Renals: Both renal arteries are patent without evidence of aneurysm, dissection, vasculitis, fibromuscular dysplasia or significant stenosis. IMA: Patent without evidence of aneurysm, dissection, vasculitis or significant stenosis. Inflow: Patent without evidence of aneurysm, dissection, vasculitis or significant stenosis. Proximal Outflow: Bilateral common femoral and visualized portions of the superficial and profunda femoral arteries are patent without evidence of aneurysm,  dissection, vasculitis or significant stenosis. Veins: Patent hepatic veins, portal vein, SMV, splenic vein, bilateral renal veins, IVC. Iliac venous system unremarkable. No venous pathology identified. Review of the MIP images confirms the above findings. NON-VASCULAR Lower chest: Subpleural atelectasis or scarring at the left lung base. No pleural or pericardial effusion. Transvenous pacing leads partially visualized. Hepatobiliary: Mildly nodular hepatic contour, without focal lesion or biliary ductal dilatation. Innumerable subcentimeter stones in the dependent aspect of the nondilated gallbladder. CBD nondilated. Pancreas: 1.9 cm ill-defined low-attenuation region in the pancreatic body, and a similar smaller 1.3 cm low-attenuation region in the pancreatic tail. There are mild regional inflammatory/edematous changes. No ductal dilatation or parenchymal atrophy. Spleen: Normal in size without focal abnormality. Adrenals/Urinary Tract: Adrenal glands are unremarkable. Kidneys are normal, without renal calculi, focal lesion, or hydronephrosis. Bladder is physiologically distended. Stomach/Bowel: Stomach is incompletely distended. Proximal small bowel is decompressed. Multiple dilated loops of mid small bowel, without wall thickening. Distal loops of small bowel are decompressed, with no discrete transition point. Terminal ileum unremarkable. Colon is nondilated. A few sigmoid diverticula without significant adjacent inflammatory/edematous change. Lymphatic: No abdominal or pelvic adenopathy. Reproductive: Prostatic enlargement with central coarse calcifications. The prostate protrudes into the lumen of the urinary bladder. Other: No ascites.  No free air. Musculoskeletal: Spondylitic changes in the lower lumbar spine. No fracture or worrisome bone lesion. IMPRESSION: VASCULAR 1. No significant proximal mesenteric arterial occlusive disease to suggest any etiology of occlusive mesenteric ischemia. 2. No evidence of  mesenteric venous thrombosis. 3.  Aortic Atherosclerosis (ICD10-170.0) without aneurysm. NON-VASCULAR 1. Multiple dilated mid small bowel loops without evident transition point or other etiology, suggesting infectious or ischemic enteritis. 2. Ill-defined low-attenuation regions in the pancreatic body and tail with adjacent inflammatory/edematous change. These were not conspicuous on the previous unenhanced CT. May be inflammatory/postinflammatory or neoplastic.  If there is no clinical or laboratory evidence of pancreatitis, consider further evaluation with dedicated abdominal MRI when the patient is clinically stable and able to follow directions and hold their breath (preferably as an outpatient). 3. Cholelithiasis 4. Sigmoid diverticulosis. Electronically Signed   By: Corlis Leak M.D.   On: 03/17/2018 10:53    ____________________________________________   PROCEDURES Procedures  ____________________________________________  DIFFERENTIAL DIAGNOSIS   Viral gastroenteritis, mesenteric ischemia, pneumonia, urinary tract infection, vagal reaction.  Doubt sepsis.  CLINICAL IMPRESSION / ASSESSMENT AND PLAN / ED COURSE  Pertinent labs & imaging results that were available during my care of the patient were reviewed by me and considered in my medical decision making (see chart for details).      Clinical Course as of Mar 17 1254  Sat Mar 17, 2018  0803 Pt p/w OOH hypotension and diaphoresis that started after a bowel movement. Pt has diarrhea and vomiting x 2 days. Denies any pain or sob or other complaints. Hemoccult positive. Check labs. IVF. Vagal episode vs gastroenteritis vs gi bleed. Lack of abd pain makes dissection, AAA, mesenteric ischemia very unlikely.    [PS]  0847 AKI vs baseline ckd compared to prior results from a year ago.   Creatinine(!): 1.81 [PS]  0847 Markedly elevated lactate. Will start aggressive IV hydration. Check GI panel, CT a/p. Will obtain angio of abdomen given no  apparent source of infection to indicate sepsis. Will initiate code sepsis due to evolving possibility of severe infectious process at this time.  Lactic Acid, Venous(!!): 5.8 [PS]    Clinical Course User Index [PS] Sharman Cheek, MD     ----------------------------------------- 12:56 PM on 03/17/2018 -----------------------------------------  CT scan shows evidence of enterocolitis, infectious versus ischemic.  Vascular study is unremarkable without evidence of occlusion.  Patient reports he does not need to be transferred to [facility] for ongoing care.  Case discussed with hospitalist for further management.  ____________________________________________   FINAL CLINICAL IMPRESSION(S) / ED DIAGNOSES    Final diagnoses:  Enteritis  Severe dehydration  Hypotension, unspecified hypotension type  Elevated lactic acid level     ED Discharge Orders    None      Portions of this note were generated with dragon dictation software. Dictation errors may occur despite best attempts at proofreading.    Sharman Cheek, MD 03/17/18 [phone]

## 2018-03-17 NOTE — ED Notes (Addendum)
Pt daughter, Carma LeavenBobette called to check on the status of patient. Pt gave verbal permission to speak with dtr. Bobette informed we are still waiting on some test results but pt is stable at this time.   Dtr reports pt has had vomiting and diarrhea x 3-4 days, she saw pt last night and he appeared weak.  Bobette: [phone]

## 2018-03-17 NOTE — H&P (Signed)
[facility] - [place] [facility]   PATIENT NAME: Alvin [name]    MR#:  [id number]  DATE OF BIRTH:  [birth year]  DATE OF ADMISSION:  03/17/2018  PRIMARY CARE PHYSICIAN: [facility]   REQUESTING/REFERRING PHYSICIAN: Dr. Alfonse Flavors  CHIEF COMPLAINT:   Chief Complaint  Patient presents with  . Weakness    HISTORY OF PRESENT ILLNESS:  Alvin [name]  is a 77 y.o. male with a known history of bipolar disorder, BPH, diabetes, gout, hypertension, hyperlipidemia who presents to the hospital from a assisted living due to acute diarrhea.  Patient says he is been having diarrhea now for the past 3 to 4 days progressively getting worse.  He denies any sick contacts, denies any nausea vomiting fever chills abdominal pain or any other associated symptoms.  He was sent from the group home to the ER and noted to be dehydrated, hypotensive, noted to have CT scan findings suggestive of a gastroenteritis/colitis.  He was also noted to have sepsis criteria given his elevated lactate, leukocytosis and CT abdomen findings as mentioned above.  Hospitalist services were contacted for admission.  Patient denies any chest pains, shortness of breath, headache, dizziness, nausea, vomiting, dysuria, hematuria or any other associated symptoms presently.  PAST MEDICAL HISTORY:   Past Medical History:  Diagnosis Date  . Bipolar disorder (HCC)   . BPH (benign prostatic hyperplasia)   . Cardiomyopathy (HCC)    a. 03/2017 Echo: EF 40%, mild diastolic dysfxn. Mild MR.  . Diabetes mellitus without complication (HCC)   . Gout   . 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  . Hyperlipidemia   . Hypertension     PAST SURGICAL HISTORY:   Past Surgical History:  Procedure Laterality Date  . LAPAROTOMY N/A 03/01/2017   Procedure: EXPLORATORY LAPAROTOMY;  Surgeon: Ancil Linsey, MD;  Location: [facility] ORS;  Service: General;  Laterality: N/A;    SOCIAL HISTORY:    Social History   Tobacco Use  . Smoking status: Never Smoker  . Smokeless tobacco: Never Used  Substance Use Topics  . Alcohol use: No    FAMILY HISTORY:   Family History  Problem Relation Age of Onset  . Stroke Mother   . Cancer Father     DRUG ALLERGIES:  No Known Allergies  REVIEW OF SYSTEMS:   Review of Systems  Constitutional: Negative for fever and weight loss.  HENT: Negative for congestion, nosebleeds and tinnitus.   Eyes: Negative for blurred vision, double vision and redness.  Respiratory: Negative for cough, hemoptysis and shortness of breath.   Cardiovascular: Negative for chest pain, orthopnea, leg swelling and PND.  Gastrointestinal: Positive for diarrhea. Negative for abdominal pain, melena, nausea and vomiting.  Genitourinary: Negative for dysuria, hematuria and urgency.  Musculoskeletal: Positive for falls. Negative for joint pain.  Neurological: Positive for weakness (Generalized). Negative for dizziness, tingling, sensory change, focal weakness, seizures and headaches.  Endo/Heme/Allergies: Negative for polydipsia. Does not bruise/bleed easily.  Psychiatric/Behavioral: Negative for depression and memory loss. The patient is not nervous/anxious.     MEDICATIONS AT HOME:   Prior to Admission medications   Medication Sig Start Date End Date Taking? Authorizing Provider  allopurinol (ZYLOPRIM) 100 MG tablet Take 100 mg by mouth daily.   Yes [provider]  aspirin 81 MG chewable tablet Chew 1 tablet (81 mg total) by mouth daily. 03/24/17  Yes Eugenie Norrie, NP  cholecalciferol (VITAMIN D) 1000 units tablet  Take 2,000 Units by mouth daily.   Yes [provider]  fenofibrate (TRICOR) 145 MG tablet Take 145 mg by mouth daily.   Yes [provider]  lisinopril (PRINIVIL,ZESTRIL) 5 MG tablet Take 5 mg by mouth daily.   Yes [provider]  metFORMIN (GLUCOPHAGE) 500 MG tablet Take 500 mg by mouth 2 (two) times daily.    Yes [provider]  Multiple Vitamins-Minerals (MULTIVITAMINS THER. W/MINERALS) TABS tablet Take 1 tablet by mouth daily.   Yes [provider]  OLANZapine (ZYPREXA) 10 MG tablet Take 10 mg by mouth at bedtime.   Yes [provider]  omeprazole (PRILOSEC) 20 MG capsule Take 20 mg by mouth every morning.   Yes [provider]  ondansetron (ZOFRAN) 4 MG tablet Take 1 tablet (4 mg total) by mouth every 6 (six) hours as needed for nausea. Patient taking differently: Take 4 mg by mouth every 6 (six) hours as needed.  03/23/17  Yes Eugenie Norrie, NP  polyethylene glycol (MIRALAX / GLYCOLAX) packet Take 17 g by mouth daily as needed for mild constipation or moderate constipation.   Yes [provider]  terazosin (HYTRIN) 5 MG capsule Take 5 mg by mouth at bedtime.   Yes [provider]  thiamine 100 MG tablet Take 1 tablet (100 mg total) by mouth daily. 03/24/17  Yes Eugenie Norrie, NP  vitamin B-12 1000 MCG tablet Take 1 tablet (1,000 mcg total) by mouth daily. 03/24/17  Yes Eugenie Norrie, NP  atorvastatin (LIPITOR) 20 MG tablet Take 1 tablet (20 mg total) by mouth daily. Patient not taking: Reported on 03/17/2018 03/24/17   Eugenie Norrie, NP  divalproex (DEPAKOTE SPRINKLE) 125 MG capsule Take 2 capsules (250 mg total) by mouth every morning. Patient not taking: Reported on 03/17/2018 03/24/17   Eugenie Norrie, NP  divalproex (DEPAKOTE SPRINKLE) 125 MG capsule Take 4 capsules (500 mg total) by mouth at bedtime. Patient not taking: Reported on 03/17/2018 03/23/17   Eugenie Norrie, NP  Melatonin 5 MG TABS Take 1 tablet (5 mg total) by mouth at bedtime. Patient not taking: Reported on 03/17/2018 03/23/17   Eugenie Norrie, NP  OLANZapine (ZYPREXA) 15 MG tablet Take 1 tablet (15 mg total) by mouth at bedtime. Patient not taking: Reported on 03/17/2018 03/23/17   Eugenie Norrie, NP  tamsulosin (FLOMAX) 0.4 MG CAPS capsule Take 1 capsule (0.4 mg  total) by mouth daily. Patient not taking: Reported on 03/17/2018 03/24/17   Eugenie Norrie, NP  traZODone (DESYREL) 50 MG tablet Take 1 tablet (50 mg total) by mouth at bedtime. Patient not taking: Reported on 03/17/2018 03/23/17   Eugenie Norrie, NP      VITAL SIGNS:  Blood pressure 101/83, pulse 78, temperature 98.2 F (36.8 C), resp. rate 17, weight 89.4 kg, SpO2 95 %.  PHYSICAL EXAMINATION:  Physical Exam  GENERAL:  77 y.o.-year-old patient lying in the bed in no acute distress.  EYES: Pupils equal, round, reactive to light and accommodation. No scleral icterus. Extraocular muscles intact.  HEENT: Head atraumatic, normocephalic. Oropharynx and nasopharynx clear. No oropharyngeal erythema, moist oral mucosa  NECK:  Supple, no jugular venous distention. No thyroid enlargement, no tenderness.  LUNGS: Normal breath sounds bilaterally, no wheezing, rales, rhonchi. No use of accessory muscles of respiration.  CARDIOVASCULAR: S1, S2 RRR. No murmurs, rubs, gallops, clicks.  ABDOMEN: Soft, nontender, nondistended. Bowel sounds present. No organomegaly or mass.  EXTREMITIES: No pedal edema,  cyanosis, or clubbing. + 2 pedal & radial pulses b/l.   NEUROLOGIC: Cranial nerves II through XII are intact. No focal Motor or sensory deficits appreciated b/l. Globally weak.  PSYCHIATRIC: The patient is alert and oriented x 3.   SKIN: No obvious rash, lesion, or ulcer.   LABORATORY PANEL:   CBC Recent Labs  Lab 03/17/18 0753  WBC 12.3*  HGB 14.8  HCT 43.5  PLT 181   ------------------------------------------------------------------------------------------------------------------  Chemistries  Recent Labs  Lab 03/17/18 0753  NA 133*  K 3.9  CL 102  CO2 17*  GLUCOSE 192*  BUN 33*  CREATININE 1.81*  CALCIUM 8.5*  AST 46*  ALT 22  ALKPHOS 57  BILITOT 1.0   ------------------------------------------------------------------------------------------------------------------  Cardiac  Enzymes Recent Labs  Lab 03/17/18 0753  TROPONINI 0.03*   ------------------------------------------------------------------------------------------------------------------  RADIOLOGY:  Dg Chest Portable 1 View  Result Date: 03/17/2018 CLINICAL DATA:  Generalized weakness. EXAM: PORTABLE CHEST 1 VIEW COMPARISON:  03/23/2017 FINDINGS: There is no focal parenchymal opacity. There is no pleural effusion or pneumothorax. The heart and mediastinal contours are unremarkable. There is a dual lead cardiac pacemaker present. The osseous structures are unremarkable. IMPRESSION: No active disease. Electronically Signed   By: Elige KoHetal  Patel   On: 03/17/2018 08:33   Ct Angio Abd/pel W And/or Wo Contrast  Result Date: 03/17/2018 CLINICAL DATA:  diaphoretic, and pale. Pt reports generalized weakness. Per EMS report upon on their arrival pt was diaphoretic and they were unable to palpate radial pulses, EMS was unable to obtain a blood pressure. CBG was 161. Pt has pacemarker in place.Pt sister reports pt had a fall last night, pt told EMS that his "legs gave out". EXAM: CTA ABDOMEN AND PELVIS WITH CONTRAST TECHNIQUE: Multidetector CT imaging of the abdomen and pelvis was performed using the standard protocol during bolus administration of intravenous contrast. Multiplanar reconstructed images and MIPs were obtained and reviewed to evaluate the vascular anatomy. CONTRAST:  75mL ISOVUE-370 IOPAMIDOL (ISOVUE-370) INJECTION 76% COMPARISON:  03/01/2017 FINDINGS: VASCULAR Aorta: Moderate partially calcified atheromatous plaque in the infrarenal segment. No aneurysm, dissection, or stenosis. Celiac: Patent without evidence of aneurysm, dissection, vasculitis or significant stenosis. SMA: Patent without evidence of aneurysm, dissection, vasculitis or significant stenosis. Renals: Both renal arteries are patent without evidence of aneurysm, dissection, vasculitis, fibromuscular dysplasia or significant stenosis. IMA: Patent  without evidence of aneurysm, dissection, vasculitis or significant stenosis. Inflow: Patent without evidence of aneurysm, dissection, vasculitis or significant stenosis. Proximal Outflow: Bilateral common femoral and visualized portions of the superficial and profunda femoral arteries are patent without evidence of aneurysm, dissection, vasculitis or significant stenosis. Veins: Patent hepatic veins, portal vein, SMV, splenic vein, bilateral renal veins, IVC. Iliac venous system unremarkable. No venous pathology identified. Review of the MIP images confirms the above findings. NON-VASCULAR Lower chest: Subpleural atelectasis or scarring at the left lung base. No pleural or pericardial effusion. Transvenous pacing leads partially visualized. Hepatobiliary: Mildly nodular hepatic contour, without focal lesion or biliary ductal dilatation. Innumerable subcentimeter stones in the dependent aspect of the nondilated gallbladder. CBD nondilated. Pancreas: 1.9 cm ill-defined low-attenuation region in the pancreatic body, and a similar smaller 1.3 cm low-attenuation region in the pancreatic tail. There are mild regional inflammatory/edematous changes. No ductal dilatation or parenchymal atrophy. Spleen: Normal in size without focal abnormality. Adrenals/Urinary Tract: Adrenal glands are unremarkable. Kidneys are normal, without renal calculi, focal lesion, or hydronephrosis. Bladder is physiologically distended. Stomach/Bowel: Stomach is incompletely distended. Proximal small bowel is decompressed. Multiple dilated loops of  mid small bowel, without wall thickening. Distal loops of small bowel are decompressed, with no discrete transition point. Terminal ileum unremarkable. Colon is nondilated. A few sigmoid diverticula without significant adjacent inflammatory/edematous change. Lymphatic: No abdominal or pelvic adenopathy. Reproductive: Prostatic enlargement with central coarse calcifications. The prostate protrudes into  the lumen of the urinary bladder. Other: No ascites.  No free air. Musculoskeletal: Spondylitic changes in the lower lumbar spine. No fracture or worrisome bone lesion. IMPRESSION: VASCULAR 1. No significant proximal mesenteric arterial occlusive disease to suggest any etiology of occlusive mesenteric ischemia. 2. No evidence of mesenteric venous thrombosis. 3.  Aortic Atherosclerosis (ICD10-170.0) without aneurysm. NON-VASCULAR 1. Multiple dilated mid small bowel loops without evident transition point or other etiology, suggesting infectious or ischemic enteritis. 2. Ill-defined low-attenuation regions in the pancreatic body and tail with adjacent inflammatory/edematous change. These were not conspicuous on the previous unenhanced CT. May be inflammatory/postinflammatory or neoplastic. If there is no clinical or laboratory evidence of pancreatitis, consider further evaluation with dedicated abdominal MRI when the patient is clinically stable and able to follow directions and hold their breath (preferably as an outpatient). 3. Cholelithiasis 4. Sigmoid diverticulosis. Electronically Signed   By: Corlis Leak M.D.   On: 03/17/2018 10:53     IMPRESSION AND PLAN:   77 year old male with past medical history of bipolar disorder, gout, essential hypertension, BPH, diabetes who presents to the hospital due to diarrhea.  1.  Sepsis-patient meets criteria given his mild leukocytosis, elevated lactate and CT scan findings suggestive of enteritis/colitis. - I suspect this is likely viral in nature.  Continue supportive care with IV fluids, antiemetics. -Hold antihypertensives, follow hemodynamics.  Follow cultures.  Await stool comprehensive culture.  2.  Diarrhea-acute in nature.  Suspected to be secondary to the underlying colitis/enteritis. - We will await stool for comprehensive culture and C. difficile. - If negative we will start the patient on some as needed Imodium.  Placed on clear liquid diet for  now.  3.  hx of bipolar disorder-continue Zyprexa.  4.  Gout-no acute attack.  Continue allopurinol.  5.  Diabetes type 2 without complication-hold metformin.  Placed on sliding scale insulin for now.  6.  GERD-continue Protonix.  7.  BPH- hold terazosin given the relative hypotension.  8.  Essential hypertension-hold lisinopril given hypotension from dehydration.    All the records are reviewed and case discussed with ED provider. Management plans discussed with the patient, family and they are in agreement.  CODE STATUS: Full code  TOTAL TIME TAKING CARE OF THIS PATIENT: 40 minutes.    Houston Siren M.D on 03/17/2018 at 1:33 PM  Between 7am to 6pm - Pager - [phone]  After 6pm go to [email or web address] - password [email or web address] [facility]  [facility]  Office  [phone]  CC: Primary care physician; [facility]

## 2018-03-17 NOTE — Clinical Social Work Note (Signed)
CSW is aware that the patient is from the [facility] [address]. CSW will assess once he is bedded on 2C.   Argentina PonderKaren Martha Latika Kronick, MSW, Theresia MajorsLCSWA [phone]

## 2018-03-17 NOTE — NC FL2 (Addendum)
[place] MEDICAID FL2 LEVEL OF CARE SCREENING TOOL     IDENTIFICATION  Patient Name: Alvin PeckJohn Marcel Birthdate: 09/19/1940 Sex: male Admission Date (Current Location): 03/17/2018  [place]   Facility and Address:  [facility], [address], [place], [place] [address]      Provider Number: [id number]  Attending Physician Name and Address:  [name], Vivek J, MD  Relative Name and Phone Number:       Current Level of Care: Hospital Recommended Level of Care: Assisted Living  Prior Approval Number:    Date Approved/Denied:   PASRR Number:  [id number] [id number] [phone]  Discharge Plan: Assisted Living    Current Diagnoses: Patient Active Problem List   Diagnosis Date Noted  . Gastroenteritis 03/17/2018  . Sepsis (HCC) 03/23/2017  . Subacute delirium 03/07/2017  . Bipolar disorder (HCC) 03/07/2017  . Nonocclusive intestinal infarction (HCC)   . Acute respiratory failure with hypoxemia (HCC)   . Abdominal distention   . Lactic acidosis   . Elevated lipase   . Severe sepsis with septic shock (HCC) 02/28/2017    Orientation RESPIRATION BLADDER Height & Weight     Self, Time, Situation, Place  Normal Continent Weight: 197 lb 1.6 oz (89.4 kg) Height:     BEHAVIORAL SYMPTOMS/MOOD NEUROLOGICAL BOWEL NUTRITION STATUS      Incontinent Diet(Diabetic)  AMBULATORY STATUS COMMUNICATION OF NEEDS Skin   Supervision Verbally Normal                       Personal Care Assistance Level of Assistance  Bathing, Feeding, Dressing, Total care Bathing Assistance: Limited assistance Feeding assistance: Independent Dressing Assistance: Limited assistance Total Care Assistance: Limited assistance   Functional Limitations Info  Hearing, Speech, Sight Sight Info: Adequate Hearing Info: Adequate Speech Info: Adequate    SPECIAL CARE FACTORS FREQUENCY  Home Health                     Contractures Contractures Info: Not present     Additional Factors Info  Allergies   Allergies Info: none           Medication List    STOP taking these medications   atorvastatin 20 MG tablet Commonly known as:  LIPITOR   Melatonin 5 MG Tabs   tamsulosin 0.4 MG Caps capsule Commonly known as:  FLOMAX   traZODone 50 MG tablet Commonly known as:  DESYREL     TAKE these medications   acetaminophen 325 MG tablet Commonly known as:  TYLENOL Take 2 tablets (650 mg total) by mouth every 6 (six) hours as needed for mild pain (or Fever >/= 101).   allopurinol 100 MG tablet Commonly known as:  ZYLOPRIM Take 100 mg by mouth daily.   aspirin 81 MG chewable tablet Chew 1 tablet (81 mg total) by mouth daily.   cholecalciferol 1000 units tablet Commonly known as:  VITAMIN D Take 2,000 Units by mouth daily.   cyanocobalamin 1000 MCG tablet Take 1 tablet (1,000 mcg total) by mouth daily.   divalproex 125 MG capsule Commonly known as:  DEPAKOTE SPRINKLE Take 2 capsules (250 mg total) by mouth every morning. What changed:  Another medication with the same name was removed. Continue taking this medication, and follow the directions you see here.   fenofibrate 145 MG tablet Commonly known as:  TRICOR Take 145 mg by mouth daily.   lisinopril 5 MG tablet Commonly known as:  PRINIVIL,ZESTRIL Take 5  mg by mouth daily.   metFORMIN 500 MG tablet Commonly known as:  GLUCOPHAGE Take 500 mg by mouth 2 (two) times daily.   multivitamins ther. w/minerals Tabs tablet Take 1 tablet by mouth daily.   OLANZapine 10 MG tablet Commonly known as:  ZYPREXA Take 10 mg by mouth at bedtime. What changed:  Another medication with the same name was removed. Continue taking this medication, and follow the directions you see here.   omeprazole 20 MG capsule Commonly known as:  PRILOSEC Take 20 mg by mouth every morning.   ondansetron 4 MG tablet Commonly known as:  ZOFRAN Take 1 tablet (4 mg total) by mouth every  6 (six) hours as needed for nausea. What changed:  reasons to take this   polyethylene glycol packet Commonly known as:  MIRALAX / GLYCOLAX Take 17 g by mouth daily as needed for mild constipation or moderate constipation.   terazosin 5 MG capsule Commonly known as:  HYTRIN Take 5 mg by mouth at bedtime.   thiamine 100 MG tablet Take 1 tablet (100 mg total) by mouth daily.        Additional Information SS# [id number]  Cheron Schaumann, [place]

## 2018-03-17 NOTE — Clinical Social Work Note (Signed)
Clinical Social Work Assessment  Patient Details  Name: Alvin [name] MRN: [id number] Date of Birth: 07/25/1941  Date of referral:  03/17/18               Reason for consult:  Other (Comment Required)(From a facility)                Permission sought to share information with:  Family Supports Permission granted to share information::  Yes, Verbal Permission Granted  Name::     Pennie BanterBobette Bristol [phone]  Agency::  [facility]  Relationship::     Contact Information:     Housing/Transportation Living arrangements for the past 2 months:  [facility] Source of Information:  Patient Patient Interpreter Needed:  None Criminal Activity/Legal Involvement Pertinent to Current Situation/Hospitalization:  No - Comment as needed Significant Relationships:  Adult Children Lives with:  Facility Resident Do you feel safe going back to the place where you live?  Yes Need for family participation in patient care:  No (Coment)  Care giving concerns:  TBD   Social Worker assessment / plan:  LCSW introduced myself to patient. He was calm and appeared tired. Patient relayed he is a resident of the [facility] and has been there for 6 months. He reports he is divorced and has 2 daughters. I keeps in touch the other daughter does not. Verbal consent given to speak to daughter Iris PertBobbette. Patient reports he was independent with his ADLs and became very weak these past few days. He reports he does receive some help with ADLs. He reports he has been having some memory issues. 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  Patient was taken to medical floor room 216. Patient will DC back to [facility] once medically better.   Employment status:  Retired([employer]) Insurance information:  Medicare, Other (Comment Required)([facility]) PT Recommendations:  Not assessed at this time Information / Referral to community resources:  Other (Comment Required)(None  )  Patient/Family's Response to care:  TBD  Patient/Family's Understanding of and Emotional Response to Diagnosis, Current Treatment, and Prognosis:  TBD  Emotional Assessment Appearance:    Attitude/Demeanor/Rapport:  Lethargic Affect (typically observed):  Calm, Flat Orientation:  Oriented to Self, Oriented to Place, Oriented to  Time, Oriented to Situation Alcohol / Substance use:  Not Applicable Psych involvement (Current and /or in the community):  No (Comment)  Discharge Needs  Concerns to be addressed:  No discharge needs identified Readmission within the last 30 days:  No Current discharge risk:  None Barriers to Discharge:  No Barriers Identified   Cheron SchaumannBandi, Ariatna Jester M, LCSW 03/17/2018, 3:14 PM

## 2018-03-17 NOTE — Progress Notes (Signed)
Patient opted to wait until tomorrow morning to take medications.

## 2018-03-17 NOTE — ED Triage Notes (Signed)
[facility] [place]. Per EMS, pt went to the bathroom and had BM, when pt came out of the bathroom pt became diaphoretic, and pale. Pt reports generalized weakness. Per EMS report upon on their arrival pt was diaphoretic and they were unable to palpate radial pulses, EMS was unable to obtain a blood pressure. CBG was 161. Pt has pacemarker in place.  Pt sister reports pt had a fall last night, pt told EMS that his "legs gave out". Pt denies hitting his head. Pt in NAD at this time.

## 2018-03-18 LAB — GLUCOSE, CAPILLARY
GLUCOSE-CAPILLARY: 122 mg/dL — AB (ref 70–99)
Glucose-Capillary: 138 mg/dL — ABNORMAL HIGH (ref 70–99)
Glucose-Capillary: 150 mg/dL — ABNORMAL HIGH (ref 70–99)
Glucose-Capillary: 135 mg/dL — ABNORMAL HIGH (ref 70–99)

## 2018-03-18 LAB — BASIC METABOLIC PANEL
ANION GAP: 7 (ref 5–15)
BUN: 21 mg/dL (ref 8–23)
CO2: 21 mmol/L — ABNORMAL LOW (ref 22–32)
Calcium: 7.8 mg/dL — ABNORMAL LOW (ref 8.9–10.3)
Chloride: 108 mmol/L (ref 98–111)
Creatinine, Ser: 1.2 mg/dL (ref 0.61–1.24)
GFR calc Af Amer: >60 mL/min (ref >60)
GFR calc non Af Amer: 57 mL/min — ABNORMAL LOW (ref >60)
Glucose, Bld: 136 mg/dL — ABNORMAL HIGH (ref 70–99)
POTASSIUM: 3.5 mmol/L (ref 3.5–5.1)
SODIUM: 136 mmol/L (ref 135–145)

## 2018-03-18 LAB — CBC
HCT: 39.8 % — ABNORMAL LOW (ref 40.0–52.0)
HEMOGLOBIN: 13.7 g/dL (ref 13.0–18.0)
MCH: 33.4 pg (ref 26.0–34.0)
MCHC: 34.4 g/dL (ref 32.0–36.0)
MCV: 97.2 fL (ref 80.0–100.0)
Platelets: 146 10*3/uL — ABNORMAL LOW (ref 150–440)
RBC: 4.09 MIL/uL — ABNORMAL LOW (ref 4.40–5.90)
RDW: 14.4 % (ref 11.5–14.5)
WBC: 12.3 10*3/uL — AB (ref 3.8–10.6)

## 2018-03-18 LAB — URINE CULTURE: Culture: NO GROWTH

## 2018-03-18 MED ORDER — TERAZOSIN HCL 5 MG PO CAPS
5.0000 mg | ORAL_CAPSULE | Freq: Every day | ORAL | Status: DC
  Administered 2018-03-18: 5 mg via ORAL
  Filled 2018-03-18 (×2): qty 1

## 2018-03-18 NOTE — Progress Notes (Signed)
[place] [place] NAME: Alvin [name]    MR#:  [id number]  DATE OF BIRTH:  1941-03-25  SUBJECTIVE:  CHIEF COMPLAINT: Patient is nauseous but denies any vomiting tolerating liquid diet.  Denies any diarrhea.  No abdominal pain.  Feeling weak and tired  REVIEW OF SYSTEMS:  CONSTITUTIONAL: No fever, fatigue or weakness.  EYES: No blurred or double vision.  EARS, NOSE, AND THROAT: No tinnitus or ear pain.  RESPIRATORY: No cough, shortness of breath, wheezing or hemoptysis.  CARDIOVASCULAR: No chest pain, orthopnea, edema.  GASTROINTESTINAL: Reports  nausea, denies vomiting, diarrhea or abdominal pain.  GENITOURINARY: No dysuria, hematuria.  ENDOCRINE: No polyuria, nocturia,  HEMATOLOGY: No anemia, easy bruising or bleeding SKIN: No rash or lesion. MUSCULOSKELETAL: No joint pain or arthritis.   NEUROLOGIC: No tingling, numbness, weakness.  PSYCHIATRY: No anxiety or depression.   DRUG ALLERGIES:  No Known Allergies  VITALS:  Blood pressure 134/79, pulse 75, temperature 98.8 F (37.1 C), temperature source Oral, resp. rate 20, height _0  (1.727 m), weight 89.4 kg, SpO2 97 %.  PHYSICAL EXAMINATION:  GENERAL:  77 y.o.-year-old patient lying in the bed with no acute distress.  EYES: Pupils equal, round, reactive to light and accommodation. No scleral icterus. Extraocular muscles intact.  HEENT: Head atraumatic, normocephalic. Oropharynx and nasopharynx clear.  NECK:  Supple, no jugular venous distention. No thyroid enlargement, no tenderness.  LUNGS: Normal breath sounds bilaterally, no wheezing, rales,rhonchi or crepitation. No use of accessory muscles of respiration.  CARDIOVASCULAR: S1, S2 normal. No murmurs, rubs, or gallops.  ABDOMEN: Soft, nontender, nondistended. Bowel sounds present.  EXTREMITIES: No pedal edema, cyanosis, or clubbing.  NEUROLOGIC: Cranial nerves II through XII are intact. Muscle strength 5/5 in all  extremities. Sensation intact. Gait not checked.  PSYCHIATRIC: The patient is alert and oriented x 3.  SKIN: No obvious rash, lesion, or ulcer.    LABORATORY PANEL:   CBC Recent Labs  Lab 03/18/18 0346  WBC 12.3*  HGB 13.7  HCT 39.8*  PLT 146*   ------------------------------------------------------------------------------------------------------------------  Chemistries  Recent Labs  Lab 03/17/18 0753 03/18/18 0346  NA 133* 136  K 3.9 3.5  CL 102 108  CO2 17* 21*  GLUCOSE 192* 136*  BUN 33* 21  CREATININE 1.81* 1.20  CALCIUM 8.5* 7.8*  AST 46*  --   ALT 22  --   ALKPHOS 57  --   BILITOT 1.0  --    ------------------------------------------------------------------------------------------------------------------  Cardiac Enzymes Recent Labs  Lab 03/17/18 0753  TROPONINI 0.03*   ------------------------------------------------------------------------------------------------------------------  RADIOLOGY:  Dg Chest Portable 1 View  Result Date: 03/17/2018 CLINICAL DATA:  Generalized weakness. EXAM: PORTABLE CHEST 1 VIEW COMPARISON:  03/23/2017 FINDINGS: There is no focal parenchymal opacity. There is no pleural effusion or pneumothorax. The heart and mediastinal contours are unremarkable. There is a dual lead cardiac pacemaker present. The osseous structures are unremarkable. IMPRESSION: No active disease. Electronically Signed   By: Kathreen Devoid   On: 03/17/2018 08:33   Ct Angio Abd/pel W And/or Wo Contrast  Result Date: 03/17/2018 CLINICAL DATA:  diaphoretic, and pale. Pt reports generalized weakness. Per EMS report upon on their arrival pt was diaphoretic and they were unable to palpate radial pulses, EMS was unable to obtain a blood pressure. CBG was 161. Pt has pacemarker in place.Pt sister reports pt had a fall last night, pt told EMS that his "legs gave out". EXAM: CTA ABDOMEN AND PELVIS WITH CONTRAST TECHNIQUE:  Multidetector CT imaging of the abdomen and  pelvis was performed using the standard protocol during bolus administration of intravenous contrast. Multiplanar reconstructed images and MIPs were obtained and reviewed to evaluate the vascular anatomy. CONTRAST:  53m ISOVUE-370 IOPAMIDOL (ISOVUE-370) INJECTION 76% COMPARISON:  03/01/2017 FINDINGS: VASCULAR Aorta: Moderate partially calcified atheromatous plaque in the infrarenal segment. No aneurysm, dissection, or stenosis. Celiac: Patent without evidence of aneurysm, dissection, vasculitis or significant stenosis. SMA: Patent without evidence of aneurysm, dissection, vasculitis or significant stenosis. Renals: Both renal arteries are patent without evidence of aneurysm, dissection, vasculitis, fibromuscular dysplasia or significant stenosis. IMA: Patent without evidence of aneurysm, dissection, vasculitis or significant stenosis. Inflow: Patent without evidence of aneurysm, dissection, vasculitis or significant stenosis. Proximal Outflow: Bilateral common femoral and visualized portions of the superficial and profunda femoral arteries are patent without evidence of aneurysm, dissection, vasculitis or significant stenosis. Veins: Patent hepatic veins, portal vein, SMV, splenic vein, bilateral renal veins, IVC. Iliac venous system unremarkable. No venous pathology identified. Review of the MIP images confirms the above findings. NON-VASCULAR Lower chest: Subpleural atelectasis or scarring at the left lung base. No pleural or pericardial effusion. Transvenous pacing leads partially visualized. Hepatobiliary: Mildly nodular hepatic contour, without focal lesion or biliary ductal dilatation. Innumerable subcentimeter stones in the dependent aspect of the nondilated gallbladder. CBD nondilated. Pancreas: 1.9 cm ill-defined low-attenuation region in the pancreatic body, and a similar smaller 1.3 cm low-attenuation region in the pancreatic tail. There are mild regional inflammatory/edematous changes. No ductal  dilatation or parenchymal atrophy. Spleen: Normal in size without focal abnormality. Adrenals/Urinary Tract: Adrenal glands are unremarkable. Kidneys are normal, without renal calculi, focal lesion, or hydronephrosis. Bladder is physiologically distended. Stomach/Bowel: Stomach is incompletely distended. Proximal small bowel is decompressed. Multiple dilated loops of mid small bowel, without wall thickening. Distal loops of small bowel are decompressed, with no discrete transition point. Terminal ileum unremarkable. Colon is nondilated. A few sigmoid diverticula without significant adjacent inflammatory/edematous change. Lymphatic: No abdominal or pelvic adenopathy. Reproductive: Prostatic enlargement with central coarse calcifications. The prostate protrudes into the lumen of the urinary bladder. Other: No ascites.  No free air. Musculoskeletal: Spondylitic changes in the lower lumbar spine. No fracture or worrisome bone lesion. IMPRESSION: VASCULAR 1. No significant proximal mesenteric arterial occlusive disease to suggest any etiology of occlusive mesenteric ischemia. 2. No evidence of mesenteric venous thrombosis. 3.  Aortic Atherosclerosis (ICD10-170.0) without aneurysm. NON-VASCULAR 1. Multiple dilated mid small bowel loops without evident transition point or other etiology, suggesting infectious or ischemic enteritis. 2. Ill-defined low-attenuation regions in the pancreatic body and tail with adjacent inflammatory/edematous change. These were not conspicuous on the previous unenhanced CT. May be inflammatory/postinflammatory or neoplastic. If there is no clinical or laboratory evidence of pancreatitis, consider further evaluation with dedicated abdominal MRI when the patient is clinically stable and able to follow directions and hold their breath (preferably as an outpatient). 3. Cholelithiasis 4. Sigmoid diverticulosis. Electronically Signed   By: DLucrezia EuropeM.D.   On: 03/17/2018 10:53    EKG:   Orders  placed or performed during the hospital encounter of 03/17/18  . EKG 12-Lead  . EKG 12-Lead  . ED EKG  . ED EKG    ASSESSMENT AND PLAN:    77year old male with past medical history of bipolar disorder, gout, essential hypertension, BPH, diabetes who presents to the hospital due to diarrhea.  1.  Sepsis-patient met criteria given his mild leukocytosis, elevated lactate and CT scan findings suggestive of enteritis/colitis at the  time of admission -  likely viral in nature.  -Clinically improving - Continue supportive care with IV fluids, antiemetics. -Hold antihypertensives, follow hemodynamics. -  Blood and urine cultures are negative.  MRSA PCR is negative -Stool for C. difficile toxin is negative and GI panel is negative  2.  Diarrhea-acute in nature.  Suspected to be secondary to the underlying colitis/enteritis. - stool neg for comprehensive culture and C. difficile. - Diarrhea resolved -Tolerating liquid diet advance diet as tolerated  3.  hx of bipolar disorder-continue Zyprexa.  4.  Gout-no acute attack.  Continue allopurinol.  5.  Diabetes type 2 without complication-hold metformin.  Placed on sliding scale insulin for now.  6.  GERD-continue Protonix.  7.  BPH- resume terazosin   8.  Essential hypertension-hold lisinopril given hypotension from dehydration.    All the records are reviewed and case discussed with Care Management/Social Workerr. Management plans discussed with the patient, family and they are in agreement.  CODE STATUS: Full code  TOTAL TIME TAKING CARE OF THIS PATIENT: 35 minutes.   POSSIBLE D/C IN 1 DAYS, DEPENDING ON CLINICAL CONDITION.  Note: This dictation was prepared with Dragon dictation along with smaller phrase technology. Any transcriptional errors that result from this process are unintentional.   Nicholes Mango M.D on 03/18/2018 at 8:48 PM  Between 7am to 6pm - Pager - [phone] After 6pm go to [email or web address] - password  [facility]  Office  [phone]  CC: Primary care physician; [facility], [place]

## 2018-03-19 LAB — BASIC METABOLIC PANEL
ANION GAP: 7 (ref 5–15)
BUN: 13 mg/dL (ref 8–23)
CO2: 21 mmol/L — ABNORMAL LOW (ref 22–32)
Calcium: 7.8 mg/dL — ABNORMAL LOW (ref 8.9–10.3)
Chloride: 110 mmol/L (ref 98–111)
Creatinine, Ser: 0.99 mg/dL (ref 0.61–1.24)
Glucose, Bld: 158 mg/dL — ABNORMAL HIGH (ref 70–99)
Potassium: 3.7 mmol/L (ref 3.5–5.1)
SODIUM: 138 mmol/L (ref 135–145)

## 2018-03-19 LAB — CBC
HCT: 39.1 % — ABNORMAL LOW (ref 40.0–52.0)
Hemoglobin: 13.6 g/dL (ref 13.0–18.0)
MCH: 33.9 pg (ref 26.0–34.0)
MCHC: 34.7 g/dL (ref 32.0–36.0)
MCV: 97.5 fL (ref 80.0–100.0)
Platelets: 127 10*3/uL — ABNORMAL LOW (ref 150–440)
RBC: 4.01 MIL/uL — AB (ref 4.40–5.90)
RDW: 14.3 % (ref 11.5–14.5)
WBC: 10.6 10*3/uL (ref 3.8–10.6)

## 2018-03-19 LAB — GLUCOSE, CAPILLARY
GLUCOSE-CAPILLARY: 147 mg/dL — AB (ref 70–99)
GLUCOSE-CAPILLARY: 196 mg/dL — AB (ref 70–99)

## 2018-03-19 MED ORDER — LISINOPRIL 10 MG PO TABS
5.0000 mg | ORAL_TABLET | Freq: Every day | ORAL | Status: DC
  Administered 2018-03-19: 5 mg via ORAL
  Filled 2018-03-19: qty 1

## 2018-03-19 MED ORDER — ACETAMINOPHEN 325 MG PO TABS: 650.0000 mg | ORAL_TABLET | Freq: Four times a day (QID) | ORAL | Status: AC | PRN

## 2018-03-19 MED ORDER — POTASSIUM CHLORIDE CRYS ER 20 MEQ PO TBCR
20.0000 meq | EXTENDED_RELEASE_TABLET | Freq: Once | ORAL | Status: AC
Start: 2018-03-19 — End: 2018-03-19
  Administered 2018-03-19: 20 meq via ORAL
  Filled 2018-03-19: qty 1

## 2018-03-19 NOTE — Progress Notes (Signed)
03/19/2018 2:06 PM  [facility] nurse at the [facility] where pt will be discharged today.  Bradly Chrisougherty, Keari Miu E, RN

## 2018-03-19 NOTE — Discharge Summary (Signed)
[facility] NAME: Alvin [name]    MR#:  [id number]  DATE OF BIRTH:  11-09-1940  DATE OF ADMISSION:  03/17/2018 ADMITTING PHYSICIAN: Henreitta Leber, MD  DATE OF DISCHARGE: 03/19/18  PRIMARY CARE PHYSICIAN: [facility]    ADMISSION DIAGNOSIS:  Enteritis [K52.9] Severe dehydration [E86.0] Elevated lactic acid level [R79.89] Hypotension, unspecified hypotension type [I95.9]  DISCHARGE DIAGNOSIS:  Active Problems:   Gastroenteritis weakness  SECONDARY DIAGNOSIS:   Past Medical History:  Diagnosis Date  . Bipolar disorder ([place])   . BPH (benign prostatic hyperplasia)   . Cardiomyopathy ([place])    a. 03/2017 Echo: EF 40%, mild diastolic dysfxn. Mild MR.  . Diabetes mellitus without complication ([place])   . Gout   . 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  . Hyperlipidemia   . Hypertension     HOSPITAL COURSE:   HPI  Alvin [name]  is a 77 y.o. male with a known history of bipolar disorder, BPH, diabetes, gout, hypertension, hyperlipidemia who presents to the hospital from a assisted living due to acute diarrhea.  Patient says he is been having diarrhea now for the past 3 to 4 days progressively getting worse.  He denies any sick contacts, denies any nausea vomiting fever chills abdominal pain or any other associated symptoms.  He was sent from the group home to the ER and noted to be dehydrated, hypotensive, noted to have CT scan findings suggestive of a gastroenteritis/colitis.  He was also noted to have sepsis criteria given his elevated lactate, leukocytosis and CT abdomen findings as mentioned above.  Hospitalist services were contacted for admission.  Patient denies any chest pains, shortness of breath, headache, dizziness, nausea, vomiting, dysuria, hematuria or any other associated symptoms presently.  1. Sepsis-patient met criteria given his mild leukocytosis, elevated lactate and CT  scan findings suggestive of enteritis/colitis at the time of admission -  likely viral in nature.  -Clinically improving -Improved with supportive care and IV fluids  - Hemodynamically stable - Blood and urine cultures are negative.  MRSA PCR is negative -Stool for C. difficile toxin is negative and GI panel is negative  2. Diarrhea-acute in nature. Suspected to be secondary to the underlying colitis/enteritis. - stool neg for comprehensive culture and C. difficile. - Diarrhea resolved -Tolerating advanced diet 3.hxof bipolar disorder-continue Zyprexa.  4. Gout-no acute attack. Continue allopurinol.  5. Diabetes type 2 without complication- resume metformin. Placed on sliding scale insulin for now.  6. GERD-continue Protonix.  7. BPH- resume terazosin   8. Essential hypertension- resume home medication lisinopril and Hytrin.  9.  Nonspecific pancreatic enhancement-PCP to get outpatient MRI for follow-up  DISCHARGE CONDITIONS:   fair  CONSULTS OBTAINED:     PROCEDURES  None   DRUG ALLERGIES:  No Known Allergies  DISCHARGE MEDICATIONS:   Allergies as of 03/19/2018   No Known Allergies     Medication List    STOP taking these medications   atorvastatin 20 MG tablet Commonly known as:  LIPITOR   Melatonin 5 MG Tabs   tamsulosin 0.4 MG Caps capsule Commonly known as:  FLOMAX   traZODone 50 MG tablet Commonly known as:  DESYREL     TAKE these medications   acetaminophen 325 MG tablet Commonly known as:  TYLENOL Take 2 tablets (650 mg total) by mouth every 6 (six) hours as needed for mild pain (or Fever >/= 101).  allopurinol 100 MG tablet Commonly known as:  ZYLOPRIM Take 100 mg by mouth daily.   aspirin 81 MG chewable tablet Chew 1 tablet (81 mg total) by mouth daily.   cholecalciferol 1000 units tablet Commonly known as:  VITAMIN D Take 2,000 Units by mouth daily.   cyanocobalamin 1000 MCG tablet Take 1 tablet (1,000 mcg  total) by mouth daily.   divalproex 125 MG capsule Commonly known as:  DEPAKOTE SPRINKLE Take 2 capsules (250 mg total) by mouth every morning. What changed:  Another medication with the same name was removed. Continue taking this medication, and follow the directions you see here.   fenofibrate 145 MG tablet Commonly known as:  TRICOR Take 145 mg by mouth daily.   lisinopril 5 MG tablet Commonly known as:  PRINIVIL,ZESTRIL Take 5 mg by mouth daily.   metFORMIN 500 MG tablet Commonly known as:  GLUCOPHAGE Take 500 mg by mouth 2 (two) times daily.   multivitamins ther. w/minerals Tabs tablet Take 1 tablet by mouth daily.   OLANZapine 10 MG tablet Commonly known as:  ZYPREXA Take 10 mg by mouth at bedtime. What changed:  Another medication with the same name was removed. Continue taking this medication, and follow the directions you see here.   omeprazole 20 MG capsule Commonly known as:  PRILOSEC Take 20 mg by mouth every morning.   ondansetron 4 MG tablet Commonly known as:  ZOFRAN Take 1 tablet (4 mg total) by mouth every 6 (six) hours as needed for nausea. What changed:  reasons to take this   polyethylene glycol packet Commonly known as:  MIRALAX / GLYCOLAX Take 17 g by mouth daily as needed for mild constipation or moderate constipation.   terazosin 5 MG capsule Commonly known as:  HYTRIN Take 5 mg by mouth at bedtime.   thiamine 100 MG tablet Take 1 tablet (100 mg total) by mouth daily.        DISCHARGE INSTRUCTIONS:  Follow-up with primary care physician at the facility in 2 to 3 days Home health physical therapy    DIET:  Cardiac diet and Diabetic diet  DISCHARGE CONDITION:  Fair  ACTIVITY:  Activity as tolerated  OXYGEN:  Home Oxygen: No.   Oxygen Delivery: room air  DISCHARGE LOCATION:  [facility] with home health  If you experience worsening of your admission symptoms, develop shortness of breath, life threatening  emergency, suicidal or homicidal thoughts you must seek medical attention immediately by calling 911 or calling your MD immediately  if symptoms less severe.  You Must read complete instructions/literature along with all the possible adverse reactions/side effects for all the Medicines you take and that have been prescribed to you. Take any new Medicines after you have completely understood and accpet all the possible adverse reactions/side effects.   Please note  You were cared for by a hospitalist during your hospital stay. If you have any questions about your discharge medications or the care you received while you were in the hospital after you are discharged, you can call the unit and asked to speak with the hospitalist on call if the hospitalist that took care of you is not available. Once you are discharged, your primary care physician will handle any further medical issues. Please note that NO REFILLS for any discharge medications will be authorized once you are discharged, as it is imperative that you return to your primary care physician (or establish a relationship with a primary care physician if you  do not have one) for your aftercare needs so that they can reassess your need for medications and monitor your lab values.     Today  Chief Complaint  Patient presents with  . Weakness   Patient denies any nausea vomiting diarrhea or abdominal pain.  Doing fine.  Wants to go home.  I discussed with blood but patient's daughter agreeable.  We will arrange home health PT  ROS:  CONSTITUTIONAL: Denies fevers, chills.  Reporting generalized weakness.  EYES: Denies blurry vision, double vision, eye pain. EARS, NOSE, THROAT: Denies tinnitus, ear pain, hearing loss. RESPIRATORY: Denies cough, wheeze, shortness of breath.  CARDIOVASCULAR: Denies chest pain, palpitations, edema.  GASTROINTESTINAL: Denies nausea, vomiting, diarrhea, abdominal pain. Denies bright red blood per  rectum. GENITOURINARY: Denies dysuria, hematuria. ENDOCRINE: Denies nocturia or thyroid problems. HEMATOLOGIC AND LYMPHATIC: Denies easy bruising or bleeding. SKIN: Denies rash or lesion. MUSCULOSKELETAL: Denies pain in neck, back, shoulder, knees, hips or arthritic symptoms.  NEUROLOGIC: Denies paralysis, paresthesias.  PSYCHIATRIC: Denies anxiety or depressive symptoms.   VITAL SIGNS:  Blood pressure 137/81, pulse 74, temperature 98.4 F (36.9 C), temperature source Oral, resp. rate 20, height '5\' 8"'$  (1.727 m), weight 89.4 kg, SpO2 97 %.  I/O:    Intake/Output Summary (Last 24 hours) at 03/19/2018 1212 Last data filed at 03/19/2018 1000 Gross per 24 hour  Intake 3101 ml  Output -  Net 3101 ml    PHYSICAL EXAMINATION:  GENERAL:  77 y.o.-year-old patient lying in the bed with no acute distress.  EYES: Pupils equal, round, reactive to light and accommodation. No scleral icterus. Extraocular muscles intact.  HEENT: Head atraumatic, normocephalic. Oropharynx and nasopharynx clear.  NECK:  Supple, no jugular venous distention. No thyroid enlargement, no tenderness.  LUNGS: Normal breath sounds bilaterally, no wheezing, rales,rhonchi or crepitation. No use of accessory muscles of respiration.  CARDIOVASCULAR: S1, S2 normal. No murmurs, rubs, or gallops.  ABDOMEN: Soft, non-tender, non-distended. Bowel sounds present. No organomegaly or mass.  EXTREMITIES: No pedal edema, cyanosis, or clubbing.  NEUROLOGIC: Cranial nerves II through XII are intact.  Sensation intact. Gait not checked.  PSYCHIATRIC: The patient is alert and oriented x 3.  SKIN: No obvious rash, lesion, or ulcer.   DATA REVIEW:   CBC Recent Labs  Lab 03/19/18 0512  WBC 10.6  HGB 13.6  HCT 39.1*  PLT 127*    Chemistries  Recent Labs  Lab 03/17/18 0753  03/19/18 0512  NA 133*   < > 138  K 3.9   < > 3.7  CL 102   < > 110  CO2 17*   < > 21*  GLUCOSE 192*   < > 158*  BUN 33*   < > 13  CREATININE 1.81*   <  > 0.99  CALCIUM 8.5*   < > 7.8*  AST 46*  --   --   ALT 22  --   --   ALKPHOS 57  --   --   BILITOT 1.0  --   --    < > = values in this interval not displayed.    Cardiac Enzymes Recent Labs  Lab 03/17/18 0753  TROPONINI 0.03*    Microbiology Results  Results for orders placed or performed during the hospital encounter of 03/17/18  Urine culture     Status: None   Collection Time: 03/17/18  7:54 AM  Result Value Ref Range Status   Specimen Description   Final    URINE, RANDOM Performed  at [facility], [address] [place], [place] [address]    Special Requests   Final    NONE Performed at [facility], [address], [place], [place] [address]    Culture   Final    NO GROWTH Performed at [facility], [place] [address] [place], [place] [address]    Report Status 03/18/2018 FINAL  Final  Culture, blood (Routine X 2) w Reflex to ID Panel     Status: None (Preliminary result)   Collection Time: 03/17/18  9:18 AM  Result Value Ref Range Status   Specimen Description BLOOD RIGHT ANTECUBITAL  Final   Special Requests   Final    BOTTLES DRAWN AEROBIC AND ANAEROBIC Blood Culture results may not be optimal due to an excessive volume of blood received in culture bottles   Culture   Final    NO GROWTH 2 DAYS Performed at [facility], [address], [place], [place] [address]    Report Status PENDING  Incomplete  Culture, blood (Routine X 2) w Reflex to ID Panel     Status: None (Preliminary result)   Collection Time: 03/17/18  9:18 AM  Result Value Ref Range Status   Specimen Description BLOOD BLOOD LEFT WRIST  Final   Special Requests   Final    BOTTLES DRAWN AEROBIC AND ANAEROBIC Blood Culture results may not be optimal due to an excessive volume of blood received in culture bottles   Culture   Final    NO GROWTH 2 DAYS Performed at [facility], [address] [place], [place] [address]    Report Status PENDING   Incomplete  Gastrointestinal Panel by PCR , Stool     Status: None   Collection Time: 03/17/18 11:45 AM  Result Value Ref Range Status   Campylobacter species NOT DETECTED NOT DETECTED Final   Plesimonas shigelloides NOT DETECTED NOT DETECTED Final   Salmonella species NOT DETECTED NOT DETECTED Final   Yersinia enterocolitica NOT DETECTED NOT DETECTED Final   Vibrio species NOT DETECTED NOT DETECTED Final   Vibrio cholerae NOT DETECTED NOT DETECTED Final   Enteroaggregative E coli (EAEC) NOT DETECTED NOT DETECTED Final   Enteropathogenic E coli (EPEC) NOT DETECTED NOT DETECTED Final   Enterotoxigenic E coli (ETEC) NOT DETECTED NOT DETECTED Final   Shiga like toxin producing E coli (STEC) NOT DETECTED NOT DETECTED Final   Shigella/Enteroinvasive E coli (EIEC) NOT DETECTED NOT DETECTED Final   Cryptosporidium NOT DETECTED NOT DETECTED Final   Cyclospora cayetanensis NOT DETECTED NOT DETECTED Final   Entamoeba histolytica NOT DETECTED NOT DETECTED Final   Giardia lamblia NOT DETECTED NOT DETECTED Final   Adenovirus F40/41 NOT DETECTED NOT DETECTED Final   Astrovirus NOT DETECTED NOT DETECTED Final   Norovirus GI/GII NOT DETECTED NOT DETECTED Final   Rotavirus A NOT DETECTED NOT DETECTED Final   Sapovirus (I, II, IV, and V) NOT DETECTED NOT DETECTED Final    Comment: Performed at [facility], [address] [place], [place] [address]  C difficile quick scan w PCR reflex     Status: None   Collection Time: 03/17/18 11:45 AM  Result Value Ref Range Status   C Diff antigen NEGATIVE NEGATIVE Final   C Diff toxin NEGATIVE NEGATIVE Final   C Diff interpretation No C. difficile detected.  Final    Comment: Performed at [facility], [address] [place], [place] [address]    RADIOLOGY:  Dg  Chest Portable 1 View  Result Date: 03/17/2018 CLINICAL DATA:  Generalized weakness. EXAM: PORTABLE CHEST 1 VIEW COMPARISON:  03/23/2017 FINDINGS: There is no focal parenchymal  opacity. There is no pleural effusion or pneumothorax. The heart and mediastinal contours are unremarkable. There is a dual lead cardiac pacemaker present. The osseous structures are unremarkable. IMPRESSION: No active disease. Electronically Signed   By: Kathreen Devoid   On: 03/17/2018 08:33   Ct Angio Abd/pel W And/or Wo Contrast  Result Date: 03/17/2018 CLINICAL DATA:  diaphoretic, and pale. Pt reports generalized weakness. Per EMS report upon on their arrival pt was diaphoretic and they were unable to palpate radial pulses, EMS was unable to obtain a blood pressure. CBG was 161. Pt has pacemarker in place.Pt sister reports pt had a fall last night, pt told EMS that his "legs gave out". EXAM: CTA ABDOMEN AND PELVIS WITH CONTRAST TECHNIQUE: Multidetector CT imaging of the abdomen and pelvis was performed using the standard protocol during bolus administration of intravenous contrast. Multiplanar reconstructed images and MIPs were obtained and reviewed to evaluate the vascular anatomy. CONTRAST:  56m ISOVUE-370 IOPAMIDOL (ISOVUE-370) INJECTION 76% COMPARISON:  03/01/2017 FINDINGS: VASCULAR Aorta: Moderate partially calcified atheromatous plaque in the infrarenal segment. No aneurysm, dissection, or stenosis. Celiac: Patent without evidence of aneurysm, dissection, vasculitis or significant stenosis. SMA: Patent without evidence of aneurysm, dissection, vasculitis or significant stenosis. Renals: Both renal arteries are patent without evidence of aneurysm, dissection, vasculitis, fibromuscular dysplasia or significant stenosis. IMA: Patent without evidence of aneurysm, dissection, vasculitis or significant stenosis. Inflow: Patent without evidence of aneurysm, dissection, vasculitis or significant stenosis. Proximal Outflow: Bilateral common femoral and visualized portions of the superficial and profunda femoral arteries are patent without evidence of aneurysm, dissection, vasculitis or significant stenosis.  Veins: Patent hepatic veins, portal vein, SMV, splenic vein, bilateral renal veins, IVC. Iliac venous system unremarkable. No venous pathology identified. Review of the MIP images confirms the above findings. NON-VASCULAR Lower chest: Subpleural atelectasis or scarring at the left lung base. No pleural or pericardial effusion. Transvenous pacing leads partially visualized. Hepatobiliary: Mildly nodular hepatic contour, without focal lesion or biliary ductal dilatation. Innumerable subcentimeter stones in the dependent aspect of the nondilated gallbladder. CBD nondilated. Pancreas: 1.9 cm ill-defined low-attenuation region in the pancreatic body, and a similar smaller 1.3 cm low-attenuation region in the pancreatic tail. There are mild regional inflammatory/edematous changes. No ductal dilatation or parenchymal atrophy. Spleen: Normal in size without focal abnormality. Adrenals/Urinary Tract: Adrenal glands are unremarkable. Kidneys are normal, without renal calculi, focal lesion, or hydronephrosis. Bladder is physiologically distended. Stomach/Bowel: Stomach is incompletely distended. Proximal small bowel is decompressed. Multiple dilated loops of mid small bowel, without wall thickening. Distal loops of small bowel are decompressed, with no discrete transition point. Terminal ileum unremarkable. Colon is nondilated. A few sigmoid diverticula without significant adjacent inflammatory/edematous change. Lymphatic: No abdominal or pelvic adenopathy. Reproductive: Prostatic enlargement with central coarse calcifications. The prostate protrudes into the lumen of the urinary bladder. Other: No ascites.  No free air. Musculoskeletal: Spondylitic changes in the lower lumbar spine. No fracture or worrisome bone lesion. IMPRESSION: VASCULAR 1. No significant proximal mesenteric arterial occlusive disease to suggest any etiology of occlusive mesenteric ischemia. 2. No evidence of mesenteric venous thrombosis. 3.  Aortic  Atherosclerosis (ICD10-170.0) without aneurysm. NON-VASCULAR 1. Multiple dilated mid small bowel loops without evident transition point or other etiology, suggesting infectious or ischemic enteritis. 2. Ill-defined low-attenuation regions in the pancreatic body and tail with adjacent inflammatory/edematous change.  These were not conspicuous on the previous unenhanced CT. May be inflammatory/postinflammatory or neoplastic. If there is no clinical or laboratory evidence of pancreatitis, consider further evaluation with dedicated abdominal MRI when the patient is clinically stable and able to follow directions and hold their breath (preferably as an outpatient). 3. Cholelithiasis 4. Sigmoid diverticulosis. Electronically Signed   By: Lucrezia Europe M.D.   On: 03/17/2018 10:53    EKG:   Orders placed or performed during the hospital encounter of 03/17/18  . EKG 12-Lead  . EKG 12-Lead  . ED EKG  . ED EKG      Management plans discussed with the patient, family and they are in agreement.  CODE STATUS:     Code Status Orders  (From admission, onward)         Start     Ordered   03/17/18 1635  Full code  Continuous     03/17/18 1634        Code Status History    Date Active Date Inactive Code Status Order ID Comments User Context   03/23/2017 0441 03/24/2017 0103 DNR [id number]  Saundra Shelling, MD Inpatient   03/02/2017 1052 03/17/2017 2106 DNR [id number]  Laverle Hobby, MD Inpatient   03/01/2017 0200 03/02/2017 1052 Full Code [id number]  Hugelmeyer, Ubaldo Glassing, DO Inpatient      TOTAL TIME TAKING CARE OF THIS PATIENT: 45 minutes.   Note: This dictation was prepared with Dragon dictation along with smaller phrase technology. Any transcriptional errors that result from this process are unintentional.   _0 @  on 03/19/2018 at 12:12 PM  Between 7am to 6pm - Pager - [phone]  After 6pm go to [email or web address] - password [email or web address] [facility]  Office   [phone]  CC: Primary care physician; [facility] [place]

## 2018-03-19 NOTE — Discharge Instructions (Signed)
Follow-up with primary care physician at the facility in 2 to 3 days Home health physical therapy

## 2018-03-19 NOTE — Progress Notes (Signed)
03/19/2018 2:41 PM  Lorenda PeckJohn Mciver to be D/C'd Home per MD order.  Discussed prescriptions and follow up appointments with the patient. Prescriptions given to patient, medication list explained in detail. Pt verbalized understanding.  Allergies as of 03/19/2018   No Known Allergies     Medication List    STOP taking these medications   atorvastatin 20 MG tablet Commonly known as:  LIPITOR   Melatonin 5 MG Tabs   tamsulosin 0.4 MG Caps capsule Commonly known as:  FLOMAX   traZODone 50 MG tablet Commonly known as:  DESYREL     TAKE these medications   acetaminophen 325 MG tablet Commonly known as:  TYLENOL Take 2 tablets (650 mg total) by mouth every 6 (six) hours as needed for mild pain (or Fever >/= 101).   allopurinol 100 MG tablet Commonly known as:  ZYLOPRIM Take 100 mg by mouth daily.   aspirin 81 MG chewable tablet Chew 1 tablet (81 mg total) by mouth daily.   cholecalciferol 1000 units tablet Commonly known as:  VITAMIN D Take 2,000 Units by mouth daily.   cyanocobalamin 1000 MCG tablet Take 1 tablet (1,000 mcg total) by mouth daily.   divalproex 125 MG capsule Commonly known as:  DEPAKOTE SPRINKLE Take 2 capsules (250 mg total) by mouth every morning. What changed:  Another medication with the same name was removed. Continue taking this medication, and follow the directions you see here.   fenofibrate 145 MG tablet Commonly known as:  TRICOR Take 145 mg by mouth daily.   lisinopril 5 MG tablet Commonly known as:  PRINIVIL,ZESTRIL Take 5 mg by mouth daily.   metFORMIN 500 MG tablet Commonly known as:  GLUCOPHAGE Take 500 mg by mouth 2 (two) times daily.   multivitamins ther. w/minerals Tabs tablet Take 1 tablet by mouth daily.   OLANZapine 10 MG tablet Commonly known as:  ZYPREXA Take 10 mg by mouth at bedtime. What changed:  Another medication with the same name was removed. Continue taking this medication, and follow the directions you see here.    omeprazole 20 MG capsule Commonly known as:  PRILOSEC Take 20 mg by mouth every morning.   ondansetron 4 MG tablet Commonly known as:  ZOFRAN Take 1 tablet (4 mg total) by mouth every 6 (six) hours as needed for nausea. What changed:  reasons to take this   polyethylene glycol packet Commonly known as:  MIRALAX / GLYCOLAX Take 17 g by mouth daily as needed for mild constipation or moderate constipation.   terazosin 5 MG capsule Commonly known as:  HYTRIN Take 5 mg by mouth at bedtime.   thiamine 100 MG tablet Take 1 tablet (100 mg total) by mouth daily.       Vitals:   03/19/18 0552 03/19/18 1232  BP: 137/81 129/84  Pulse: 74 85  Resp: 20 16  Temp: 98.4 F (36.9 C) 98.2 F (36.8 C)  SpO2: 97% 96%    Skin clean, dry and intact without evidence of skin break down, no evidence of skin tears noted. IV catheter discontinued intact. Site without signs and symptoms of complications. Dressing and pressure applied. Pt denies pain at this time. No complaints noted.  An After Visit Summary was printed and given to the patient. Patient escorted via WC, and D/C home via private auto.  Bradly Chrisougherty, Tyaire Odem E

## 2018-03-19 NOTE — Clinical Social Work Note (Signed)
CSW has been informed patient is to return to [facility] today. CSW has spoken to MazieDustin at [facility] and informed him of patient's return. CSW also inquired about the preferred home health agency and it is [facility]. CSW has relayed this to RN CM. Patient's family is aware of discharge and will transport. Discharge information has been sent to facility via Epic and in packet. York SpanielMonica Thomas Mabry MSW,LCSW [phone]

## 2018-03-19 NOTE — Care Management Note (Signed)
Case Management Note  Patient Details  Name: Alvin [name] MRN: [id number] Date of Birth: 07/27/1941   Patient to discharge today back to [facility].  CSW facilitating.  Patient agreeable to home health services.  States that he does not have a preference of agency.  Per [facility] their preference is [facility].  Referral made to [place] with [facility].  [facility]  RNCM signing off.   Subjective/Objective:                    Action/Plan:   Expected Discharge Date:  03/19/18               Expected Discharge Plan:  Home w Home Health Services  In-House Referral:     Discharge planning Services  CM Consult  Post Acute Care Choice:  Home Health Choice offered to:  Patient  DME Arranged:    DME Agency:     HH Arranged:  PT, Social Work [employer] Agency:  [facility]  Status of Service:  Completed, signed off  If discussed at [employer] Meetings, dates discussed:    Additional Comments:  Chapman FitchBOWEN, Huntington Leverich T, RN 03/19/2018, 2:01 PM

## 2018-03-22 ENCOUNTER — Emergency Department: Payer: Medicare Other

## 2018-03-22 ENCOUNTER — Inpatient Hospital Stay
Admission: EM | Admit: 2018-03-22 | Discharge: 2018-03-26 | DRG: 392 | Disposition: A | Discharge status: Home / Self Care | Payer: Medicare Other | Attending: Internal Medicine | Admitting: Internal Medicine

## 2018-03-22 ENCOUNTER — Other Ambulatory Visit: Payer: Self-pay

## 2018-03-22 DIAGNOSIS — I5022 Chronic systolic (congestive) heart failure: Secondary | ICD-10-CM | POA: Diagnosis present

## 2018-03-22 DIAGNOSIS — R82998 Other abnormal findings in urine: Secondary | ICD-10-CM

## 2018-03-22 DIAGNOSIS — I110 Hypertensive heart disease with heart failure: Secondary | ICD-10-CM | POA: Diagnosis present

## 2018-03-22 DIAGNOSIS — Z950 Presence of cardiac pacemaker: Secondary | ICD-10-CM

## 2018-03-22 DIAGNOSIS — M109 Gout, unspecified: Secondary | ICD-10-CM | POA: Diagnosis present

## 2018-03-22 DIAGNOSIS — K56600 Partial intestinal obstruction, unspecified as to cause: Secondary | ICD-10-CM | POA: Diagnosis present

## 2018-03-22 DIAGNOSIS — N390 Urinary tract infection, site not specified: Secondary | ICD-10-CM | POA: Diagnosis present

## 2018-03-22 DIAGNOSIS — D72829 Elevated white blood cell count, unspecified: Secondary | ICD-10-CM

## 2018-03-22 DIAGNOSIS — I2510 Atherosclerotic heart disease of native coronary artery without angina pectoris: Secondary | ICD-10-CM | POA: Diagnosis present

## 2018-03-22 DIAGNOSIS — Z66 Do not resuscitate: Secondary | ICD-10-CM | POA: Diagnosis present

## 2018-03-22 DIAGNOSIS — F319 Bipolar disorder, unspecified: Secondary | ICD-10-CM | POA: Diagnosis present

## 2018-03-22 DIAGNOSIS — D649 Anemia, unspecified: Secondary | ICD-10-CM | POA: Diagnosis present

## 2018-03-22 DIAGNOSIS — Z79899 Other long term (current) drug therapy: Secondary | ICD-10-CM

## 2018-03-22 DIAGNOSIS — E785 Hyperlipidemia, unspecified: Secondary | ICD-10-CM | POA: Diagnosis present

## 2018-03-22 DIAGNOSIS — I252 Old myocardial infarction: Secondary | ICD-10-CM

## 2018-03-22 DIAGNOSIS — A419 Sepsis, unspecified organism: Secondary | ICD-10-CM

## 2018-03-22 DIAGNOSIS — R197 Diarrhea, unspecified: Secondary | ICD-10-CM

## 2018-03-22 DIAGNOSIS — K529 Noninfective gastroenteritis and colitis, unspecified: Secondary | ICD-10-CM | POA: Diagnosis not present

## 2018-03-22 DIAGNOSIS — I429 Cardiomyopathy, unspecified: Secondary | ICD-10-CM | POA: Diagnosis present

## 2018-03-22 DIAGNOSIS — K7460 Unspecified cirrhosis of liver: Secondary | ICD-10-CM | POA: Diagnosis present

## 2018-03-22 DIAGNOSIS — K862 Cyst of pancreas: Secondary | ICD-10-CM

## 2018-03-22 DIAGNOSIS — E119 Type 2 diabetes mellitus without complications: Secondary | ICD-10-CM | POA: Diagnosis present

## 2018-03-22 DIAGNOSIS — Z7984 Long term (current) use of oral hypoglycemic drugs: Secondary | ICD-10-CM

## 2018-03-22 DIAGNOSIS — N400 Enlarged prostate without lower urinary tract symptoms: Secondary | ICD-10-CM | POA: Diagnosis present

## 2018-03-22 LAB — URINALYSIS, COMPLETE (UACMP) WITH MICROSCOPIC
BACTERIA UA: NONE SEEN
Bilirubin Urine: NEGATIVE
Glucose, UA: 150 mg/dL — AB
Hgb urine dipstick: NEGATIVE
Ketones, ur: NEGATIVE mg/dL
Nitrite: NEGATIVE
PROTEIN: NEGATIVE mg/dL
SPECIFIC GRAVITY, URINE: 1.009 (ref 1.005–1.030)
SQUAMOUS EPITHELIAL / LPF: NONE SEEN (ref 0–5)
pH: 6 (ref 5.0–8.0)

## 2018-03-22 LAB — COMPREHENSIVE METABOLIC PANEL
ALK PHOS: 145 U/L — AB (ref 38–126)
ALT: 67 U/L — ABNORMAL HIGH (ref 0–44)
ANION GAP: 7 (ref 5–15)
AST: 84 U/L — AB (ref 15–41)
Albumin: 3 g/dL — ABNORMAL LOW (ref 3.5–5.0)
BILIRUBIN TOTAL: 0.9 mg/dL (ref 0.3–1.2)
BUN: 7 mg/dL — AB (ref 8–23)
CO2: 23 mmol/L (ref 22–32)
Calcium: 8.8 mg/dL — ABNORMAL LOW (ref 8.9–10.3)
Chloride: 109 mmol/L (ref 98–111)
Creatinine, Ser: 1.07 mg/dL (ref 0.61–1.24)
GFR calc Af Amer: >60 mL/min (ref >60)
GFR calc non Af Amer: >60 mL/min (ref >60)
GLUCOSE: 172 mg/dL — AB (ref 70–99)
POTASSIUM: 4.1 mmol/L (ref 3.5–5.1)
SODIUM: 139 mmol/L (ref 135–145)
Total Protein: 6 g/dL — ABNORMAL LOW (ref 6.5–8.1)

## 2018-03-22 LAB — CULTURE, BLOOD (ROUTINE X 2)
CULTURE: NO GROWTH
Culture: NO GROWTH

## 2018-03-22 LAB — CBC
HEMATOCRIT: 39.9 % — AB (ref 40.0–52.0)
HEMOGLOBIN: 13.6 g/dL (ref 13.0–18.0)
MCH: 32.6 pg (ref 26.0–34.0)
MCHC: 34.1 g/dL (ref 32.0–36.0)
MCV: 95.8 fL (ref 80.0–100.0)
Platelets: 203 10*3/uL (ref 150–440)
RBC: 4.16 MIL/uL — ABNORMAL LOW (ref 4.40–5.90)
RDW: 14.7 % — AB (ref 11.5–14.5)
WBC: 15.3 10*3/uL — ABNORMAL HIGH (ref 3.8–10.6)

## 2018-03-22 LAB — LIPASE, BLOOD: LIPASE: 46 U/L (ref 11–51)

## 2018-03-22 MED ORDER — ONDANSETRON 4 MG PO TBDP
4.0000 mg | ORAL_TABLET | Freq: Once | ORAL | Status: AC
  Administered 2018-03-22: 4 mg via ORAL
  Filled 2018-03-22: qty 1

## 2018-03-22 MED ORDER — SODIUM CHLORIDE 0.9 % IV BOLUS
500.0000 mL | Freq: Once | INTRAVENOUS | Status: AC
  Administered 2018-03-22: 500 mL via INTRAVENOUS

## 2018-03-22 MED ORDER — CEPHALEXIN 500 MG PO CAPS: 500.0000 mg | ORAL_CAPSULE | Freq: Two times a day (BID) | ORAL | 0 refills | Status: DC

## 2018-03-22 MED ORDER — IOPAMIDOL (ISOVUE-300) INJECTION 61%
30.0000 mL | Freq: Once | INTRAVENOUS | Status: AC | PRN
  Administered 2018-03-22: 30 mL via ORAL

## 2018-03-22 MED ORDER — PIPERACILLIN-TAZOBACTAM 3.375 G IVPB
3.3750 g | Freq: Three times a day (TID) | INTRAVENOUS | Status: DC
Start: 2018-03-22 — End: 2018-03-26
  Administered 2018-03-22 – 2018-03-26 (×11): 3.375 g via INTRAVENOUS
  Filled 2018-03-22 (×12): qty 50

## 2018-03-22 MED ORDER — IOPAMIDOL (ISOVUE-300) INJECTION 61%
100.0000 mL | Freq: Once | INTRAVENOUS | Status: AC | PRN
  Administered 2018-03-22: 100 mL via INTRAVENOUS

## 2018-03-22 MED ORDER — CEPHALEXIN 500 MG PO CAPS
500.0000 mg | ORAL_CAPSULE | Freq: Once | ORAL | Status: AC
  Administered 2018-03-22: 500 mg via ORAL
  Filled 2018-03-22: qty 1

## 2018-03-22 NOTE — Progress Notes (Signed)
Pharmacy Antibiotic Note  Alvin PeckJohn Gilmore is a 77 y.o. male admitted on 03/22/2018 with intra-abdominal infection.  Pharmacy has been consulted for zosyn dosing.  Plan: Zosyn 3.375g IV q8h (4 hour infusion).  Height: 5\' 8"  (172.7 cm) Weight: 196 lb 3.4 oz (89 kg) IBW/kg (Calculated) : 68.4  Temp (24hrs), Avg:98.6 F (37 C), Min:98.6 F (37 C), Max:98.6 F (37 C)  Recent Labs  Lab 03/17/18 0753 03/17/18 0754 03/17/18 1123 03/18/18 0346 03/19/18 0512 03/22/18 1203  WBC 12.3*  --   --  12.3* 10.6 15.3*  CREATININE 1.81*  --   --  1.20 0.99 1.07  LATICACIDVEN  --  5.8* 2.0*  --   --   --     Estimated Creatinine Clearance: 63.6 mL/min (by C-G formula based on SCr of 1.07 mg/dL).    No Known Allergies  Thank you for allowing pharmacy to be a part of this patient's care.  Thomasene Rippleavid Aahan Marques, PharmD, BCPS Clinical Pharmacist 03/22/2018

## 2018-03-22 NOTE — ED Triage Notes (Signed)
Pt c/o abd cramping with diarrhea for the past week. States he was admitted 810-8/12 for same, states he was worse but the diarrhea has not resolved. Pt is a/ox4. Pt was [facility].

## 2018-03-22 NOTE — ED Provider Notes (Signed)
[facility] Emergency Department Provider Note   ____________________________________________   First MD Initiated Contact with Patient 03/22/18 1524     (approximate)  I have reviewed the triage vital signs and the nursing notes.   HISTORY  Chief Complaint Diarrhea    HPI Alvin [name] is a 77 y.o. male reports he has been having diarrhea for about 5 or 6 days now.  He reports overall it is getting better.  He said the doctors been checking [facility] and reported sound like his stomach was grumbling a lot today so they did an x-ray and advised him to come over here to be checked out.  Reports overall he is feeling better he was having diarrhea up to every 3 or 4 hours, but reports that is tapered off and he thinks only had about 1 or 2 bowel movements today.  No fevers or chills.  He is thirsty and hungry now, reports his appetite has not been too great with the diarrhea but it seems to be getting better.  He does still occasionally get some slight cramps throughout the abdomen but no persistent pain.  Denies any black or bloody stools.  No vomiting since leaving the hospital.  Reports overall is feeling better but [facility], is not quite certain as to why other than telling him that his stomach sounded like it was grumbling a lot.  Right now he denies any ongoing pain.  No nausea at present, and feels like he is getting better overall.  No cough.  No fever no pain or burning with urination.  Past Medical History:  Diagnosis Date  . Bipolar disorder (HCC)   . BPH (benign prostatic hyperplasia)   . Cardiomyopathy (HCC)    a. 03/2017 Echo: EF 40%, mild diastolic dysfxn. Mild MR.  . Diabetes mellitus without complication (HCC)   . Gout   . 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  . Hyperlipidemia   . Hypertension     Patient Active Problem List   Diagnosis Date Noted  .  Gastroenteritis 03/17/2018  . Sepsis (HCC) 03/23/2017  . Subacute delirium 03/07/2017  . Bipolar disorder (HCC) 03/07/2017  . Nonocclusive intestinal infarction (HCC)   . Acute respiratory failure with hypoxemia (HCC)   . Abdominal distention   . Lactic acidosis   . Elevated lipase   . Severe sepsis with septic shock (HCC) 02/28/2017    Past Surgical History:  Procedure Laterality Date  . LAPAROTOMY N/A 03/01/2017   Procedure: EXPLORATORY LAPAROTOMY;  Surgeon: Ancil Linseyavis, Jason Evan, MD;  Location: [facility] ORS;  Service: General;  Laterality: N/A;    Prior to Admission medications   Medication Sig Start Date End Date Taking? Authorizing Provider  allopurinol (ZYLOPRIM) 100 MG tablet Take 100 mg by mouth daily.   Yes [provider]  aspirin 81 MG chewable tablet Chew 1 tablet (81 mg total) by mouth daily. 03/24/17  Yes Eugenie NorrieBlakeney, Dana G, NP  cholecalciferol (VITAMIN D) 1000 units tablet Take 2,000 Units by mouth daily.   Yes [provider]  fenofibrate (TRICOR) 145 MG tablet Take 145 mg by mouth daily.   Yes [provider]  lisinopril (PRINIVIL,ZESTRIL) 5 MG tablet Take 5 mg by mouth daily.   Yes [provider]  metFORMIN (GLUCOPHAGE) 500 MG tablet Take 500 mg by mouth 2 (two) times daily.   Yes [provider]  Multiple  Vitamins-Minerals (MULTIVITAMINS THER. W/MINERALS) TABS tablet Take 1 tablet by mouth daily.   Yes [provider]  OLANZapine (ZYPREXA) 10 MG tablet Take 10 mg by mouth at bedtime.   Yes [provider]  omeprazole (PRILOSEC) 20 MG capsule Take 20 mg by mouth every morning.   Yes [provider]  terazosin (HYTRIN) 5 MG capsule Take 5 mg by mouth at bedtime.   Yes [provider]  thiamine 100 MG tablet Take 1 tablet (100 mg total) by mouth daily. 03/24/17  Yes Eugenie Norrie, NP  vitamin B-12 1000 MCG tablet Take 1 tablet (1,000 mcg total) by mouth daily. 03/24/17  Yes Eugenie Norrie, NP    acetaminophen (TYLENOL) 325 MG tablet Take 2 tablets (650 mg total) by mouth every 6 (six) hours as needed for mild pain (or Fever >/= 101). 03/19/18   Gouru, Deanna Artis, MD  cephALEXin (KEFLEX) 500 MG capsule Take 1 capsule (500 mg total) by mouth 2 (two) times daily for 5 days. 03/22/18 03/27/18  Sharyn Creamer, MD  divalproex (DEPAKOTE SPRINKLE) 125 MG capsule Take 2 capsules (250 mg total) by mouth every morning. Patient not taking: Reported on 03/17/2018 03/24/17   Eugenie Norrie, NP  ondansetron (ZOFRAN) 4 MG tablet Take 1 tablet (4 mg total) by mouth every 6 (six) hours as needed for nausea. Patient taking differently: Take 4 mg by mouth every 6 (six) hours as needed.  03/23/17   Eugenie Norrie, NP  polyethylene glycol (MIRALAX / GLYCOLAX) packet Take 17 g by mouth daily as needed for mild constipation or moderate constipation.    [provider]    Allergies Patient has no known allergies.  Family History  Problem Relation Age of Onset  . Stroke Mother   . Cancer Father     Social History Social History   Tobacco Use  . Smoking status: Never Smoker  . Smokeless tobacco: Never Used  Substance Use Topics  . Alcohol use: No  . Drug use: No    Review of Systems Constitutional: No fever/chills Eyes: No visual changes.  Chronic problems with the right eye. ENT: No sore throat. Cardiovascular: Denies chest pain. Respiratory: Denies shortness of breath. Gastrointestinal: No abdominal pain presently but has been having intermittently some crampy discomfort that seems to be getting better daily. Genitourinary: Negative for dysuria. Musculoskeletal: Negative for back pain. Skin: Negative for rash. Neurological: Negative for headaches, focal weakness or numbness.    ____________________________________________   PHYSICAL EXAM:  VITAL SIGNS: ED Triage Vitals  Enc Vitals Group     BP 03/22/18 1157 123/78     Pulse Rate 03/22/18 1157 (!) 103     Resp 03/22/18 1157 18      Temp 03/22/18 1157 98.6 F (37 C)     Temp Source 03/22/18 1157 Oral     SpO2 03/22/18 1157 94 %     Weight 03/22/18 1159 196 lb 3.4 oz (89 kg)     Height 03/22/18 1159 5\' 8"  (1.727 m)     Head Circumference --      Peak Flow --      Pain Score 03/22/18 1159 0     Pain Loc --      Pain Edu? --      Excl. in GC? --     Constitutional: Alert and oriented. Well appearing and in no acute distress.  He is very pleasant.  He is requesting to have some ginger ale or stress dose. Eyes:  Conjunctivae are normal.  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 Nose: No congestion/rhinnorhea. Mouth/Throat: Mucous membranes are moist. Neck: No stridor.   Cardiovascular: Normal rate, regular rhythm. Grossly normal heart sounds.  Good peripheral circulation. Respiratory: Normal respiratory effort.  No retractions. Lungs CTAB. Gastrointestinal: Soft and nontender. No distention.  No pain on exam.  No pain to rebound.  Negative Murphy. Musculoskeletal: No lower extremity tenderness nor edema. Neurologic:  Normal speech and language. No gross focal neurologic deficits are appreciated.  Skin:  Skin is warm, dry and intact. No rash noted. Psychiatric: Mood and affect are normal. Speech and behavior are normal.  ____________________________________________   LABS (all labs ordered are listed, but only abnormal results are displayed)  Labs Reviewed  COMPREHENSIVE METABOLIC PANEL - Abnormal; Notable for the following components:      Result Value   Glucose, Bld 172 (*)    BUN 7 (*)    Calcium 8.8 (*)    Total Protein 6.0 (*)    Albumin 3.0 (*)    AST 84 (*)    ALT 67 (*)    Alkaline Phosphatase 145 (*)    All other components within normal limits  CBC - Abnormal; Notable for the following components:   WBC 15.3 (*)    RBC 4.16 (*)    HCT 39.9 (*)    RDW 14.7 (*)    All other components within normal limits   URINALYSIS, COMPLETE (UACMP) WITH MICROSCOPIC - Abnormal; Notable for the following components:   Color, Urine YELLOW (*)    APPearance CLEAR (*)    Glucose, UA 150 (*)    Leukocytes, UA SMALL (*)    All other components within normal limits  URINE CULTURE  LIPASE, BLOOD   ____________________________________________  EKG   ____________________________________________  RADIOLOGY  Dg Abdomen Acute W/chest  Result Date: 03/22/2018 CLINICAL DATA:  2-3 day history of nausea. One-week history of diarrhea. EXAM: DG ABDOMEN ACUTE W/ 1V CHEST COMPARISON:  CT abdomen and pelvis 03/17/2018. Chest x-rays 03/17/2018 and earlier. Abdominal x-ray 03/23/2017. FINDINGS: Multiple dilated loops of small bowel throughout the upper abdomen. Gas and expected stool burden throughout the normal caliber colon. Scattered air-fluid levels on the ERECT view. No evidence of free intraperitoneal air. No visible opaque urinary tract calculi. Cardiac silhouette normal in size, unchanged. LEFT subclavian dual lead transvenous pacemaker unchanged and intact. Mild atelectasis at the LEFT lung base, new. Lungs otherwise clear. IMPRESSION: 1. Partial small bowel obstruction. No evidence of free intraperitoneal air. 2. Mild atelectasis involving the LEFT LOWER LOBE. No acute cardiopulmonary disease otherwise. Electronically Signed   By: Hulan Saashomas  Lawrence M.D.   On: 03/22/2018 16:14     Please see PACS for CT report which I reviewed and discussed with Dr. Earlene Plateravis for quite some time. ____________________________________________   PROCEDURES  Procedure(s) performed: None  Procedures  Critical Care performed: No  ____________________________________________   INITIAL IMPRESSION / ASSESSMENT AND PLAN / ED COURSE  Pertinent labs & imaging results that were available during my care of the patient were reviewed by me and considered in my medical decision making (see chart for details).  Patient here for evaluation of  ongoing stools and crampy discomfort off and on.  Overall however his clinical history reports he is feeling better daily and he looks well today without any abdominal discomfort and he actually reports he would like to have something to drink and his nausea  is better.  His lab work today appears much better with regard to his renal function, his white count is elevated though at 15,000 but he denies any worsening symptoms and denies any other infectious symptoms I would suspect this is likely related to his recent colitis which is thought viral with negative C. difficile and stool cultures previous.  Very reassuring clinical exam.  I will obtain an x-ray as he reports he had an x-ray at his facility and was referred here for further evaluation, though he does not know of any read on the x-ray.  Have called the [facility] and requested to speak to the physician who is been seeing him to further discuss reason for evaluation today as well so as not to miss anything they have noted.  Patient's daughter arrived, reports the patient tends to minimize symptoms and that they are concerned that he may be having ongoing or worsening symptoms despite his home voicing that symptoms are improving but he is an unreliable historian.  Based on his further imaging studies were included including CT scan and also there is report from the nursing home care manager that the patient did have a measurable fever this morning though the exact number is not known.  Plan to give a slight dose of IV hydration, Zofran, and oral challenge.  Will review his x-rays  Clinical Course as of Mar 22 2218  Thu Mar 22, 2018  1546 Case discussed with care manager, Hospital doctor at the [place].  Physician who saw patient earlier is not available but Amber does know that he wanted to have him evaluated today as I did notice he had a slight temperature earlier today that went away.  She reports he does have a history of urinary tract infections in the past with  Isamah no any specific symptoms.  Alysis today does demonstrate some small leukocytes and white cells though no bacteria.  Given his slightly elevated white count without a clear etiology otherwise I will start the patient on cephalexin in the event this is a early UTI.  His LFTs were also reviewed and there is just minimal transaminitis today without abdominal pain and previous CT that was recently performed I do not believe repeat CT imaging or further intra-abdominal injury beyond his x-rays currently warranted unless symptoms were to begin such as abdominal pain nausea vomiting or worsening symptoms.   [MQ]  2105 Diffuse, mid abd distention with partial improvement compared with prior. Consider enteritis or query a partial obstruction.    [MQ]    Clinical Course User Index [MQ] Sharyn Creamer, MD   ----------------------------------------- 5:54 PM on 03/22/2018 -----------------------------------------  ----------------------------------------- 10:20 PM on 03/22/2018 -----------------------------------------  I discussed with Dr. Earlene Plater extensively, he is reviewed imaging clinical history and knows patient from previous surgery.  Recommends that it would seem reasonable at this point given the elevated leukocytosis with report of fever at the nursing home to treat with IV antibiotic and he will provide consultation also request GI consultation.  Dr. Earlene Plater recommends admission for IV antibiotics, will start Zosyn for broad intra-abdominal coverage because of concern for ongoing symptoms, enteritis less likely felt to be obstruction given his clinical history, and abnormalities including a hypodense lesion about 2.6 cm in size which is unclear as etiology though certainly infectious etiology is considered such as possible abscess.  Patient and patient's daughter agreeable understand plan for admission.  Anticipate GI consultation which Dr. Earlene Plater is agreed to and he has requested GI consultation for  which  the hospitalist will also evaluate.  Discussed case with Dr. Caryn Bee.  ____________________________________________   FINAL CLINICAL IMPRESSION(S) / ED DIAGNOSES  Final diagnoses:  High urine leukocyte count  Enteritis  Leukocytosis, unspecified type      NEW MEDICATIONS STARTED DURING THIS VISIT:  New Prescriptions   CEPHALEXIN (KEFLEX) 500 MG CAPSULE    Take 1 capsule (500 mg total) by mouth 2 (two) times daily for 5 days.     Note:  This document was prepared using Dragon voice recognition software and may include unintentional dictation errors.     Sharyn Creamer, MD 03/22/18 2222

## 2018-03-23 ENCOUNTER — Other Ambulatory Visit: Payer: Self-pay

## 2018-03-23 DIAGNOSIS — R197 Diarrhea, unspecified: Secondary | ICD-10-CM

## 2018-03-23 DIAGNOSIS — E119 Type 2 diabetes mellitus without complications: Secondary | ICD-10-CM | POA: Diagnosis present

## 2018-03-23 DIAGNOSIS — K56600 Partial intestinal obstruction, unspecified as to cause: Secondary | ICD-10-CM | POA: Diagnosis present

## 2018-03-23 DIAGNOSIS — Z950 Presence of cardiac pacemaker: Secondary | ICD-10-CM | POA: Diagnosis not present

## 2018-03-23 DIAGNOSIS — N390 Urinary tract infection, site not specified: Secondary | ICD-10-CM | POA: Diagnosis present

## 2018-03-23 DIAGNOSIS — E785 Hyperlipidemia, unspecified: Secondary | ICD-10-CM | POA: Diagnosis present

## 2018-03-23 DIAGNOSIS — I2510 Atherosclerotic heart disease of native coronary artery without angina pectoris: Secondary | ICD-10-CM | POA: Diagnosis present

## 2018-03-23 DIAGNOSIS — K529 Noninfective gastroenteritis and colitis, unspecified: Principal | ICD-10-CM

## 2018-03-23 DIAGNOSIS — I110 Hypertensive heart disease with heart failure: Secondary | ICD-10-CM | POA: Diagnosis present

## 2018-03-23 DIAGNOSIS — F319 Bipolar disorder, unspecified: Secondary | ICD-10-CM | POA: Diagnosis present

## 2018-03-23 DIAGNOSIS — K862 Cyst of pancreas: Secondary | ICD-10-CM | POA: Diagnosis not present

## 2018-03-23 DIAGNOSIS — I429 Cardiomyopathy, unspecified: Secondary | ICD-10-CM | POA: Diagnosis present

## 2018-03-23 DIAGNOSIS — N400 Enlarged prostate without lower urinary tract symptoms: Secondary | ICD-10-CM | POA: Diagnosis present

## 2018-03-23 DIAGNOSIS — K7469 Other cirrhosis of liver: Secondary | ICD-10-CM | POA: Diagnosis not present

## 2018-03-23 DIAGNOSIS — R935 Abnormal findings on diagnostic imaging of other abdominal regions, including retroperitoneum: Secondary | ICD-10-CM | POA: Diagnosis not present

## 2018-03-23 DIAGNOSIS — Z7984 Long term (current) use of oral hypoglycemic drugs: Secondary | ICD-10-CM | POA: Diagnosis not present

## 2018-03-23 DIAGNOSIS — K7460 Unspecified cirrhosis of liver: Secondary | ICD-10-CM | POA: Diagnosis present

## 2018-03-23 DIAGNOSIS — I5022 Chronic systolic (congestive) heart failure: Secondary | ICD-10-CM | POA: Diagnosis present

## 2018-03-23 DIAGNOSIS — Z66 Do not resuscitate: Secondary | ICD-10-CM | POA: Diagnosis present

## 2018-03-23 DIAGNOSIS — Z79899 Other long term (current) drug therapy: Secondary | ICD-10-CM | POA: Diagnosis not present

## 2018-03-23 DIAGNOSIS — I252 Old myocardial infarction: Secondary | ICD-10-CM | POA: Diagnosis not present

## 2018-03-23 DIAGNOSIS — D649 Anemia, unspecified: Secondary | ICD-10-CM | POA: Diagnosis present

## 2018-03-23 DIAGNOSIS — M109 Gout, unspecified: Secondary | ICD-10-CM | POA: Diagnosis present

## 2018-03-23 LAB — BASIC METABOLIC PANEL
ANION GAP: 4 — AB (ref 5–15)
BUN: 6 mg/dL — AB (ref 8–23)
CALCIUM: 8.3 mg/dL — AB (ref 8.9–10.3)
CO2: 27 mmol/L (ref 22–32)
Chloride: 108 mmol/L (ref 98–111)
Creatinine, Ser: 0.94 mg/dL (ref 0.61–1.24)
GFR calc Af Amer: >60 mL/min (ref >60)
GFR calc non Af Amer: >60 mL/min (ref >60)
GLUCOSE: 140 mg/dL — AB (ref 70–99)
Potassium: 3.6 mmol/L (ref 3.5–5.1)
SODIUM: 139 mmol/L (ref 135–145)

## 2018-03-23 LAB — GLUCOSE, CAPILLARY
GLUCOSE-CAPILLARY: 118 mg/dL — AB (ref 70–99)
GLUCOSE-CAPILLARY: 129 mg/dL — AB (ref 70–99)
GLUCOSE-CAPILLARY: 116 mg/dL — AB (ref 70–99)
Glucose-Capillary: 102 mg/dL — ABNORMAL HIGH (ref 70–99)
Glucose-Capillary: 136 mg/dL — ABNORMAL HIGH (ref 70–99)

## 2018-03-23 LAB — CBC
HCT: 35.7 % — ABNORMAL LOW (ref 40.0–52.0)
Hemoglobin: 12.2 g/dL — ABNORMAL LOW (ref 13.0–18.0)
MCH: 32.8 pg (ref 26.0–34.0)
MCHC: 34 g/dL (ref 32.0–36.0)
MCV: 96.4 fL (ref 80.0–100.0)
PLATELETS: 168 10*3/uL (ref 150–440)
RBC: 3.71 MIL/uL — ABNORMAL LOW (ref 4.40–5.90)
RDW: 14.4 % (ref 11.5–14.5)
WBC: 13.8 10*3/uL — AB (ref 3.8–10.6)

## 2018-03-23 LAB — TSH: TSH: 0.978 u[IU]/mL (ref 0.350–4.500)

## 2018-03-23 MED ORDER — TERAZOSIN HCL 5 MG PO CAPS
5.0000 mg | ORAL_CAPSULE | Freq: Every day | ORAL | Status: DC
Start: 2018-03-23 — End: 2018-03-26
  Administered 2018-03-23 – 2018-03-25 (×3): 5 mg via ORAL
  Filled 2018-03-23 (×4): qty 1

## 2018-03-23 MED ORDER — INSULIN ASPART 100 UNIT/ML ~~LOC~~ SOLN
0.0000 [IU] | Freq: Three times a day (TID) | SUBCUTANEOUS | Status: DC
  Administered 2018-03-24: 3 [IU] via SUBCUTANEOUS
  Administered 2018-03-25: 18:00:00 2 [IU] via SUBCUTANEOUS
  Administered 2018-03-25: 13:00:00 1 [IU] via SUBCUTANEOUS
  Filled 2018-03-23 (×3): qty 1

## 2018-03-23 MED ORDER — HEPARIN SODIUM (PORCINE) 5000 UNIT/ML IJ SOLN
5000.0000 [IU] | Freq: Three times a day (TID) | INTRAMUSCULAR | Status: DC
  Administered 2018-03-23 – 2018-03-26 (×8): 5000 [IU] via SUBCUTANEOUS
  Filled 2018-03-23 (×9): qty 1

## 2018-03-23 MED ORDER — ONDANSETRON HCL 4 MG PO TABS: 4.0000 mg | ORAL_TABLET | Freq: Four times a day (QID) | ORAL | Status: DC | PRN

## 2018-03-23 MED ORDER — ONDANSETRON HCL 4 MG/2ML IJ SOLN: 4.0000 mg | Freq: Four times a day (QID) | INTRAMUSCULAR | Status: DC | PRN

## 2018-03-23 MED ORDER — VITAMIN B-1 100 MG PO TABS
100.0000 mg | ORAL_TABLET | Freq: Every day | ORAL | Status: DC
  Administered 2018-03-23 – 2018-03-26 (×4): 100 mg via ORAL
  Filled 2018-03-23 (×4): qty 1

## 2018-03-23 MED ORDER — LISINOPRIL 5 MG PO TABS
5.0000 mg | ORAL_TABLET | Freq: Every day | ORAL | Status: DC
  Administered 2018-03-23 – 2018-03-26 (×4): 5 mg via ORAL
  Filled 2018-03-23 (×4): qty 1

## 2018-03-23 MED ORDER — DOCUSATE SODIUM 100 MG PO CAPS
100.0000 mg | ORAL_CAPSULE | Freq: Two times a day (BID) | ORAL | Status: DC
  Administered 2018-03-23 (×2): 100 mg via ORAL
  Filled 2018-03-23 (×2): qty 1

## 2018-03-23 MED ORDER — OLANZAPINE 10 MG PO TABS
10.0000 mg | ORAL_TABLET | Freq: Every day | ORAL | Status: DC
  Administered 2018-03-23 – 2018-03-25 (×3): 10 mg via ORAL
  Filled 2018-03-23 (×4): qty 1

## 2018-03-23 MED ORDER — ACETAMINOPHEN 325 MG PO TABS
650.0000 mg | ORAL_TABLET | Freq: Four times a day (QID) | ORAL | Status: DC | PRN
  Administered 2018-03-23 (×2): 650 mg via ORAL
  Filled 2018-03-23 (×2): qty 2

## 2018-03-23 MED ORDER — ALLOPURINOL 100 MG PO TABS
100.0000 mg | ORAL_TABLET | Freq: Every day | ORAL | Status: DC
  Administered 2018-03-23 – 2018-03-26 (×3): 100 mg via ORAL
  Filled 2018-03-23 (×4): qty 1

## 2018-03-23 MED ORDER — ADULT MULTIVITAMIN W/MINERALS CH
1.0000 | ORAL_TABLET | Freq: Every day | ORAL | Status: DC
  Administered 2018-03-23 – 2018-03-26 (×4): 1 via ORAL
  Filled 2018-03-23 (×4): qty 1

## 2018-03-23 MED ORDER — TRAZODONE HCL 50 MG PO TABS: 25.0000 mg | ORAL_TABLET | Freq: Every evening | ORAL | Status: DC | PRN

## 2018-03-23 MED ORDER — HYDROCODONE-ACETAMINOPHEN 5-325 MG PO TABS: 1.0000 | ORAL_TABLET | ORAL | Status: DC | PRN

## 2018-03-23 MED ORDER — PANTOPRAZOLE SODIUM 40 MG PO TBEC
40.0000 mg | DELAYED_RELEASE_TABLET | Freq: Two times a day (BID) | ORAL | Status: DC
  Administered 2018-03-23 – 2018-03-26 (×5): 40 mg via ORAL
  Filled 2018-03-23 (×5): qty 1

## 2018-03-23 MED ORDER — VITAMIN B-12 1000 MCG PO TABS
1000.0000 ug | ORAL_TABLET | Freq: Every day | ORAL | Status: DC
  Administered 2018-03-23 – 2018-03-26 (×4): 1000 ug via ORAL
  Filled 2018-03-23 (×4): qty 1

## 2018-03-23 MED ORDER — BISACODYL 5 MG PO TBEC: 5.0000 mg | DELAYED_RELEASE_TABLET | Freq: Every day | ORAL | Status: DC | PRN

## 2018-03-23 MED ORDER — VITAMIN D 1000 UNITS PO TABS
2000.0000 [IU] | ORAL_TABLET | Freq: Every day | ORAL | Status: DC
  Administered 2018-03-23 – 2018-03-26 (×4): 2000 [IU] via ORAL
  Filled 2018-03-23 (×4): qty 2

## 2018-03-23 MED ORDER — SODIUM CHLORIDE 0.9 % IV SOLN
INTRAVENOUS | Status: DC
  Administered 2018-03-23: 75 mL/h via INTRAVENOUS
  Administered 2018-03-23 – 2018-03-25 (×3): via INTRAVENOUS

## 2018-03-23 MED ORDER — ASPIRIN 81 MG PO CHEW
81.0000 mg | CHEWABLE_TABLET | Freq: Every day | ORAL | Status: DC
  Administered 2018-03-23 – 2018-03-26 (×4): 81 mg via ORAL
  Filled 2018-03-23 (×4): qty 1

## 2018-03-23 MED ORDER — FENOFIBRATE 54 MG PO TABS
54.0000 mg | ORAL_TABLET | Freq: Every day | ORAL | Status: DC
  Administered 2018-03-23 – 2018-03-26 (×3): 54 mg via ORAL
  Filled 2018-03-23 (×4): qty 1

## 2018-03-23 MED ORDER — PANTOPRAZOLE SODIUM 40 MG PO TBEC
40.0000 mg | DELAYED_RELEASE_TABLET | Freq: Every day | ORAL | Status: DC
  Administered 2018-03-23: 40 mg via ORAL
  Filled 2018-03-23: qty 1

## 2018-03-23 MED ORDER — INSULIN ASPART 100 UNIT/ML ~~LOC~~ SOLN: 0.0000 [IU] | Freq: Every day | SUBCUTANEOUS | Status: DC

## 2018-03-23 MED ORDER — ACETAMINOPHEN 650 MG RE SUPP: 650.0000 mg | Freq: Four times a day (QID) | RECTAL | Status: DC | PRN

## 2018-03-23 NOTE — H&P (Signed)
[facility] [place] at [facility]   PATIENT NAME: Alvin PeckJohn Mcenery    MR#:  [id number]  DATE OF BIRTH:  11/20/1940  DATE OF ADMISSION:  03/22/2018  PRIMARY CARE PHYSICIAN: [facility]   REQUESTING/REFERRING PHYSICIAN:   CHIEF COMPLAINT:   Chief Complaint  Patient presents with  . Diarrhea    HISTORY OF PRESENT ILLNESS: Alvin [name]  is a 77 y.o. male with a known history of bipolar disorder, BPH, CHF, diabetes type 2, hyperlipidemia, hypertension. Patient was transferred from nursing home facility for diarrhea and fever.  Patient has had diarrhea and poor appetite for a week.  He was actually seen in the hospital again just few days ago for the same reason and he seemed to improve with IV fluids.  His diarrhea seemed to improve as well.  However, per nursing home records in the past 2 days patient has been having more frequent diarrhea and earlier today he had fever noted at the facility.  Occasional abdominal cramps are associated. Blood test done emergency room are notable for elevated WBC of 15.3.  Glucose level is 172.  Creatinine level is 1.07.  UA shows leukocytosis.  Stool test, including C. difficile and gastrointestinal panel were -4 days ago. Abdominal CT scan shows partial SBO, with new 26 mm hypodense lesion that appears centered within anterior wall of the third segment of duodenum, possibly a duodenal ulcer or inflamed diverticulum.  2 stable pancreatic lesions are noted as well. Patient is admitted for further evaluation and treatment.  PAST MEDICAL HISTORY:   Past Medical History:  Diagnosis Date  . Bipolar disorder (HCC)   . BPH (benign prostatic hyperplasia)   . Cardiomyopathy (HCC)    a. 03/2017 Echo: EF 40%, mild diastolic dysfxn. Mild MR.  . Diabetes mellitus without complication (HCC)   . Gout   . 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  . Hyperlipidemia   . Hypertension     PAST SURGICAL HISTORY:   Past Surgical History:  Procedure Laterality Date  . LAPAROTOMY N/A 03/01/2017   Procedure: EXPLORATORY LAPAROTOMY;  Surgeon: Ancil Linseyavis, Jason Evan, MD;  Location: [facility] ORS;  Service: General;  Laterality: N/A;    SOCIAL HISTORY:  Social History   Tobacco Use  . Smoking status: Never Smoker  . Smokeless tobacco: Never Used  Substance Use Topics  . Alcohol use: No    FAMILY HISTORY:  Family History  Problem Relation Age of Onset  . Stroke Mother   . Cancer Father     DRUG ALLERGIES: No Known Allergies  REVIEW OF SYSTEMS:   CONSTITUTIONAL: Positive for fever, fatigue and generalized weakness.  EYES: No changes in vision.  EARS, NOSE, AND THROAT: No tinnitus or ear pain.  RESPIRATORY: No cough, shortness of breath, wheezing or hemoptysis.  CARDIOVASCULAR: No chest pain, orthopnea, edema.  GASTROINTESTINAL: No nausea, vomiting, but positive for diarrhea and abdominal pain.  GENITOURINARY: No dysuria, hematuria.  ENDOCRINE: No polyuria, nocturia. HEMATOLOGY: No bleeding. SKIN: No rash or lesion. MUSCULOSKELETAL: No joint pain at this time.   NEUROLOGIC: No focal weakness.  PSYCHIATRY: No anxiety or depression.   MEDICATIONS AT HOME:  Prior to Admission medications   Medication Sig Start Date End Date Taking? Authorizing Provider  allopurinol (ZYLOPRIM) 100 MG tablet Take 100 mg by mouth daily.   Yes [provider]  aspirin 81 MG chewable tablet Chew 1 tablet (81 mg total) by mouth daily. 03/24/17  Yes  Eugenie Norrie, NP  cholecalciferol (VITAMIN D) 1000 units tablet Take 2,000 Units by mouth daily.   Yes [provider]  fenofibrate (TRICOR) 145 MG tablet Take 145 mg by mouth daily.   Yes [provider]  lisinopril (PRINIVIL,ZESTRIL) 5 MG tablet Take 5 mg by mouth daily.   Yes [provider]  metFORMIN (GLUCOPHAGE) 500 MG tablet Take 500 mg by mouth 2 (two) times daily.   Yes [provider]  Multiple Vitamins-Minerals  (MULTIVITAMINS THER. W/MINERALS) TABS tablet Take 1 tablet by mouth daily.   Yes [provider]  OLANZapine (ZYPREXA) 10 MG tablet Take 10 mg by mouth at bedtime.   Yes [provider]  omeprazole (PRILOSEC) 20 MG capsule Take 20 mg by mouth every morning.   Yes [provider]  terazosin (HYTRIN) 5 MG capsule Take 5 mg by mouth at bedtime.   Yes [provider]  thiamine 100 MG tablet Take 1 tablet (100 mg total) by mouth daily. 03/24/17  Yes Eugenie Norrie, NP  vitamin B-12 1000 MCG tablet Take 1 tablet (1,000 mcg total) by mouth daily. 03/24/17  Yes Eugenie Norrie, NP  acetaminophen (TYLENOL) 325 MG tablet Take 2 tablets (650 mg total) by mouth every 6 (six) hours as needed for mild pain (or Fever >/= 101). 03/19/18   Gouru, Deanna Artis, MD  cephALEXin (KEFLEX) 500 MG capsule Take 1 capsule (500 mg total) by mouth 2 (two) times daily for 5 days. 03/22/18 03/27/18  Sharyn Creamer, MD  divalproex (DEPAKOTE SPRINKLE) 125 MG capsule Take 2 capsules (250 mg total) by mouth every morning. Patient not taking: Reported on 03/17/2018 03/24/17   Eugenie Norrie, NP  ondansetron (ZOFRAN) 4 MG tablet Take 1 tablet (4 mg total) by mouth every 6 (six) hours as needed for nausea. Patient taking differently: Take 4 mg by mouth every 6 (six) hours as needed.  03/23/17   Eugenie Norrie, NP  polyethylene glycol (MIRALAX / GLYCOLAX) packet Take 17 g by mouth daily as needed for mild constipation or moderate constipation.    [provider]      PHYSICAL EXAMINATION:   VITAL SIGNS: Blood pressure 120/69, pulse 80, temperature 98.6 F (37 C), temperature source Oral, resp. rate 16, height 5\' 8"  (1.727 m), weight 89 kg, SpO2 97 %.  GENERAL:  77 y.o.-year-old patient lying in the bed with no acute distress.  EYES: Pupils equal, round, reactive to light and accommodation. No scleral icterus. Extraocular muscles intact.  HEENT: Head atraumatic, normocephalic. Oropharynx and  nasopharynx clear.  NECK:  Supple, no jugular venous distention. No thyroid enlargement, no tenderness.  LUNGS: Normal breath sounds bilaterally, no wheezing, rales,rhonchi or crepitation. No use of accessory muscles of respiration.  CARDIOVASCULAR: S1, S2 normal. No S3/S4.  ABDOMEN: Soft, nontender, nondistended. Bowel sounds present. EXTREMITIES: No pedal edema, cyanosis, or clubbing.  NEUROLOGIC: No focal weakness PSYCHIATRIC: The patient is alert and oriented x 3.  SKIN: No obvious rash, lesion, or ulcer.   LABORATORY PANEL:   CBC Recent Labs  Lab 03/17/18 0753 03/18/18 0346 03/19/18 0512 03/22/18 1203  WBC 12.3* 12.3* 10.6 15.3*  HGB 14.8 13.7 13.6 13.6  HCT 43.5 39.8* 39.1* 39.9*  PLT 181 146* 127* 203  MCV 96.8 97.2 97.5 95.8  MCH 32.9 33.4 33.9 32.6  MCHC 34.0 34.4 34.7 34.1  RDW 14.4 14.4 14.3 14.7*  LYMPHSABS 1.4  --   --   --   MONOABS 1.7*  --   --   --  EOSABS 0.0  --   --   --   BASOSABS 0.1  --   --   --    ------------------------------------------------------------------------------------------------------------------  Chemistries  Recent Labs  Lab 03/17/18 0753 03/18/18 0346 03/19/18 0512 03/22/18 1203  NA 133* 136 138 139  K 3.9 3.5 3.7 4.1  CL 102 108 110 109  CO2 17* 21* 21* 23  GLUCOSE 192* 136* 158* 172*  BUN 33* 21 13 7*  CREATININE 1.81* 1.20 0.99 1.07  CALCIUM 8.5* 7.8* 7.8* 8.8*  AST 46*  --   --  84*  ALT 22  --   --  67*  ALKPHOS 57  --   --  145*  BILITOT 1.0  --   --  0.9   ------------------------------------------------------------------------------------------------------------------ estimated creatinine clearance is 63.6 mL/min (by C-G formula based on SCr of 1.07 mg/dL). ------------------------------------------------------------------------------------------------------------------ No results for input(s): TSH, T4TOTAL, T3FREE, THYROIDAB in the last 72 hours.  Invalid input(s): FREET3   Coagulation profile Recent  Labs  Lab 03/17/18 0753  INR 1.31   ------------------------------------------------------------------------------------------------------------------- No results for input(s): DDIMER in the last 72 hours. -------------------------------------------------------------------------------------------------------------------  Cardiac Enzymes Recent Labs  Lab 03/17/18 0753  TROPONINI 0.03*   ------------------------------------------------------------------------------------------------------------------ Invalid input(s): POCBNP  ---------------------------------------------------------------------------------------------------------------  Urinalysis    Component Value Date/Time   COLORURINE YELLOW (A) 03/22/2018 1203   APPEARANCEUR CLEAR (A) 03/22/2018 1203   LABSPEC 1.009 03/22/2018 1203   PHURINE 6.0 03/22/2018 1203   GLUCOSEU 150 (A) 03/22/2018 1203   HGBUR NEGATIVE 03/22/2018 1203   BILIRUBINUR NEGATIVE 03/22/2018 1203   KETONESUR NEGATIVE 03/22/2018 1203   PROTEINUR NEGATIVE 03/22/2018 1203   NITRITE NEGATIVE 03/22/2018 1203   LEUKOCYTESUR SMALL (A) 03/22/2018 1203     RADIOLOGY: Ct Abdomen Pelvis W Contrast  Result Date: 03/22/2018 CLINICAL DATA:  77 y/o M; abdominal cramping with unresolved diarrhea for the past week. Recent admission 8/10-8/12 for the same. EXAM: CT ABDOMEN AND PELVIS WITH CONTRAST TECHNIQUE: Multidetector CT imaging of the abdomen and pelvis was performed using the standard protocol following bolus administration of intravenous contrast. CONTRAST:  100mL ISOVUE-300 IOPAMIDOL (ISOVUE-300) INJECTION 61% COMPARISON:  03/17/2018 CT abdomen and pelvis. FINDINGS: Lower chest: Severe coronary artery calcific atherosclerosis. Moderate calcification of the aortic valve. Two lead pacemaker noted. Hepatobiliary: Stable mild nodularity to the liver contour. Cholelithiasis. No gallbladder wall thickening or biliary ductal dilatation. Pancreas: Stable hypodense lesions  within the body and tail of the pancreas measuring 13 and 19 mm (series 5, image 50 and 56). No main duct dilatation. Spleen: Normal in size without focal abnormality. Adrenals/Urinary Tract: Adrenal glands are unremarkable. Kidneys are normal, without renal calculi, focal lesion, or hydronephrosis. Bladder is unremarkable. Stomach/Bowel: New hypodense lesion within the measuring up to 26 mm which appears to be centered within the anterior wall of the third segment of duodenum (series 2, image 46 and series 6, image 60). Mild diffuse distention of small bowel in the mid abdomen without a transition point. Small bowel dilatation is mildly improved compared with the prior CT of abdomen and pelvis. Normal appendix. Normal appearance of the stomach. Vascular/Lymphatic: Aortic atherosclerosis. No enlarged abdominal or pelvic lymph nodes. Reproductive: Moderate prostate enlargement extending into the floor of the bladder. Other: No abdominal wall hernia or abnormality. No abdominopelvic ascites. Musculoskeletal: No acute or significant osseous findings. IMPRESSION: 1. Mild persistent diffuse distention of small bowel in the mid abdomen, with partial interval improvement when compared with the prior CT of the abdomen and pelvis. Findings may represent infectious/inflammatory enteritis  or possibly a partial obstruction. 2. Two stable hypodense lesions within the pancreas body and tail measuring up to 19 mm. Further characterization with pancreas protocol CT or MRI is recommended on a nonemergent basis. 3. New 26 mm hypodense lesion that appears centered within anterior wall of the third segment of duodenum, possibly a duodenal ulcer or inflamed diverticulum. 4. Aortic atherosclerosis. 5. Cholelithiasis. 6. Aortic, aortic valvular, and coronary artery calcific atherosclerosis. Electronically Signed   By: Mitzi Hansen M.D.   On: 03/22/2018 19:35   Dg Abdomen Acute W/chest  Result Date: 03/22/2018 CLINICAL  DATA:  2-3 day history of nausea. One-week history of diarrhea. EXAM: DG ABDOMEN ACUTE W/ 1V CHEST COMPARISON:  CT abdomen and pelvis 03/17/2018. Chest x-rays 03/17/2018 and earlier. Abdominal x-ray 03/23/2017. FINDINGS: Multiple dilated loops of small bowel throughout the upper abdomen. Gas and expected stool burden throughout the normal caliber colon. Scattered air-fluid levels on the ERECT view. No evidence of free intraperitoneal air. No visible opaque urinary tract calculi. Cardiac silhouette normal in size, unchanged. LEFT subclavian dual lead transvenous pacemaker unchanged and intact. Mild atelectasis at the LEFT lung base, new. Lungs otherwise clear. IMPRESSION: 1. Partial small bowel obstruction. No evidence of free intraperitoneal air. 2. Mild atelectasis involving the LEFT LOWER LOBE. No acute cardiopulmonary disease otherwise. Electronically Signed   By: Hulan Saas M.D.   On: 03/22/2018 16:14    EKG: Orders placed or performed during the hospital encounter of 03/17/18  . EKG 12-Lead  . EKG 12-Lead  . ED EKG  . ED EKG  . EKG    IMPRESSION AND PLAN:  1.  Acute colitis 2.  Partial small bowel obstruction 3.  New duodenal lesion, possibly ulcer or inflamed diverticulum 4.  Pancreatic lesions, incidental finding per abdominal CAT scan 5.  Acute UTI 6.  Leukocytosis, likely related to acute colitis 7.  Diabetes type 2 8.  Hypertension  Plan:  -We will start IV fluids and antibiotics, Zosyn IV; will follow blood culture results -We will keep patient n.p.o.  -Add PPI treatment -Gastroenterology and general surgery are consulted for further evaluation and treatment -Follow-up with abdominal MRI as outpatient for pancreatic lesions as recommended -We will treat UTI with Zosyn IV while waiting for urine culture results -We will monitor blood sugars before meals and at bedtime and his insulin treatment during the hospital stay -Continue home BP meds  All the records are  reviewed and case discussed with ED provider. Management plans discussed with the patient, who is in agreement.  CODE STATUS: Full Code Status History    Date Active Date Inactive Code Status Order ID Comments User Context   03/17/2018 1634 03/19/2018 1807 Full Code [id number]  Houston Siren, MD Inpatient   03/23/2017 0441 03/24/2017 0103 DNR [id number]  Ihor Austin, MD Inpatient   03/02/2017 1052 03/17/2017 2106 DNR [id number]  Shane Crutch, MD Inpatient   03/01/2017 0200 03/02/2017 1052 Full Code [id number]  Hugelmeyer, Jon Gills, DO Inpatient       TOTAL TIME TAKING CARE OF THIS PATIENT: 45 minutes.    Cammy Copa M.D on 03/23/2018 at 12:11 AM  Between 7am to 6pm - Pager - [phone]  After 6pm go to [email or web address] - password [email or web address] [facility] [place] at [facility]  [phone]  CC: Primary care physician; [facility], [facility]

## 2018-03-23 NOTE — Clinical Social Work Note (Addendum)
Clinical Social Work Assessment  Patient Details  Name: Alvin [name] MRN: [id number] Date of Birth: 11/27/1940  Date of referral:  03/23/18               Reason for consult:  Other (Comment Required)(from [facility] )                Permission sought to share information with:  Facility Industrial/product designerContact Representative Permission granted to share information::  Yes, Verbal Permission Granted  Name::        Agency::     Relationship::     Contact Information:     Housing/Transportation Living arrangements for the past 2 months:  Assisted Living Facility Source of Information:  Facility Patient Interpreter Needed:  None Criminal Activity/Legal Involvement Pertinent to Current Situation/Hospitalization:  No - Comment as needed Significant Relationships:  Adult Children Lives with:  Facility Resident Do you feel safe going back to the place where you live?    Need for family participation in patient care:  Yes (Comment)  Care giving concerns:  Patient has been a resident at [facility] for 2 years (fax: [phone]).    Social Worker assessment / plan:  Visual merchandiserClinical Social Worker (CSW) reviewed chart and noted that patient is from [facility]. [facility] patient has been there for 2 years, is on room air and has a cpap at night. [facility] walks with a cane and without a cane sometimes. Per [facility] is open to [facility] PT. [facility] can return to [facility] when medically stable. CSW attempted to meet with patient however he was asleep. CSW contacted patient's daughter Alvin [name] and she told CSW she would call back in 30 minutes. FL2 complete. CSW will continue to follow and assist as needed.     Patient's daughter Alvin [name] called CSW back and was made aware of above. Daughter is agreeable for patient to D/C back to [facility]. Daughter requested that [facility]. RN aware of above. Per daughter she is going  out of town in the next few days and may need EMS to transport patient home.    Employment status:  Disabled (Comment on whether or not currently receiving Disability), Retired Health and safety inspectornsurance information:  Medicare PT Recommendations:  Not assessed at this time Information / Referral to community resources:  Other (Comment Required)(ALF )  Patient/Family's Response to care:  Daughter is agreeable for patient to D/C back to [facility].   Patient/Family's Understanding of and Emotional Response to Diagnosis, Current Treatment, and Prognosis: Patient's daughter was very pleasant and thanked CSW for assistance.   Emotional Assessment Appearance:  Appears stated age Attitude/Demeanor/Rapport:  Unable to Assess Affect (typically observed):  Unable to Assess Orientation:  Oriented to Self, Oriented to Place, Fluctuating Orientation (Suspected and/or reported Sundowners) Alcohol / Substance use:  Not Applicable Psych involvement (Current and /or in the community):  No (Comment)  Discharge Needs  Concerns to be addressed:  Discharge Planning Concerns Readmission within the last 30 days:  No Current discharge risk:  Dependent with Mobility Barriers to Discharge:  Continued Medical Work up   [employer], Darleen CrockerBailey M, LCSW 03/23/2018, 2:23 PM

## 2018-03-23 NOTE — Plan of Care (Signed)

## 2018-03-23 NOTE — Consult Note (Signed)
SURGICAL CONSULTATION NOTE (initial) - cpt: [id number]  HISTORY OF PRESENT ILLNESS (HPI):  77 y.o. male returned overnight from nursing facility to [facility] ED for evaluation of persistent/recurrent diarrhea with fever. Patient is a very poor historian, so the majority of his history is supplemented by medical record, but he reports he's been having diarrhea for an unclear extended period of time. He was similarly admitted to [facility] just last week, hydrated, and discharged just a few days ago for diarrhea with near-syncope. During patient's recent hospital admission, testing for c Diff was negative, lactate improved with hydration, and abdominal CTA demonstrated enteritis and low-attenuation pancreatic lesions without elevated lipase. It seems that patient's diarrhea improved with hydration during his recent hospital admission before again worsening upon return to nursing facility with at least 3 loose BM's yesterday and at least one already this morning. It is not clear whether patient's loose BM's have been melanotic or not. Patient denies any further N/V since discharge from [facility] a few days ago and denies fever/chills, CP, or SOB.  Of note, patient underwent laparotomy just over one year ago for diffuse non-focal low-flow small bowel ischemia in the context of aspiration pneumonia following a period of diarrhea. There were no small bowel diverticuli appreciated at that time, and small bowel resection was not required.  Surgery is consulted by ED and medical physicians Dr. Fanny Bien and Dr. Caryn Bee in this context for evaluation and management of diarrhea with enteritis, leukocytosis, pancreatic lesions, and duodenal lesion of unclear etiology.  PAST MEDICAL HISTORY (PMH):  Past Medical History:  Diagnosis Date  . Bipolar disorder (HCC)   . BPH (benign prostatic hyperplasia)   . Cardiomyopathy (HCC)    a. 03/2017 Echo: EF 40%, mild diastolic dysfxn. Mild MR.  . Diabetes mellitus without complication (HCC)   .  Gout   . 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  . Hyperlipidemia   . Hypertension      PAST SURGICAL HISTORY (PSH):  Past Surgical History:  Procedure Laterality Date  . LAPAROTOMY N/A 03/01/2017   Procedure: EXPLORATORY LAPAROTOMY;  Surgeon: Ancil Linsey, MD;  Location: [facility] ORS;  Service: General;  Laterality: N/A;     MEDICATIONS:  Prior to Admission medications   Medication Sig Start Date End Date Taking? Authorizing Provider  allopurinol (ZYLOPRIM) 100 MG tablet Take 100 mg by mouth daily.   Yes [provider]  aspirin 81 MG chewable tablet Chew 1 tablet (81 mg total) by mouth daily. 03/24/17  Yes Eugenie Norrie, NP  cholecalciferol (VITAMIN D) 1000 units tablet Take 2,000 Units by mouth daily.   Yes [provider]  fenofibrate (TRICOR) 145 MG tablet Take 145 mg by mouth daily.   Yes [provider]  lisinopril (PRINIVIL,ZESTRIL) 5 MG tablet Take 5 mg by mouth daily.   Yes [provider]  metFORMIN (GLUCOPHAGE) 500 MG tablet Take 500 mg by mouth 2 (two) times daily.   Yes [provider]  Multiple Vitamins-Minerals (MULTIVITAMINS THER. W/MINERALS) TABS tablet Take 1 tablet by mouth daily.   Yes [provider]  OLANZapine (ZYPREXA) 10 MG tablet Take 10 mg by mouth at bedtime.   Yes [provider]  omeprazole (PRILOSEC) 20 MG capsule Take 20 mg by mouth every morning.   Yes [provider]  terazosin (HYTRIN) 5 MG capsule Take 5 mg by mouth at bedtime.   Yes [provider]  thiamine 100 MG tablet Take 1 tablet (100  mg total) by mouth daily. 03/24/17  Yes Eugenie Norrie, NP  vitamin B-12 1000 MCG tablet Take 1 tablet (1,000 mcg total) by mouth daily. 03/24/17  Yes Eugenie Norrie, NP  acetaminophen (TYLENOL) 325 MG tablet Take 2 tablets (650 mg total) by mouth every 6 (six) hours as needed for mild pain (or Fever >/= 101). 03/19/18   Gouru, Deanna Artis, MD  cephALEXin  (KEFLEX) 500 MG capsule Take 1 capsule (500 mg total) by mouth 2 (two) times daily for 5 days. 03/22/18 03/27/18  Sharyn Creamer, MD  divalproex (DEPAKOTE SPRINKLE) 125 MG capsule Take 2 capsules (250 mg total) by mouth every morning. Patient not taking: Reported on 03/17/2018 03/24/17   Eugenie Norrie, NP  ondansetron (ZOFRAN) 4 MG tablet Take 1 tablet (4 mg total) by mouth every 6 (six) hours as needed for nausea. Patient taking differently: Take 4 mg by mouth every 6 (six) hours as needed.  03/23/17   Eugenie Norrie, NP  polyethylene glycol (MIRALAX / GLYCOLAX) packet Take 17 g by mouth daily as needed for mild constipation or moderate constipation.    [provider]     ALLERGIES:  No Known Allergies   SOCIAL HISTORY:  Social History   Socioeconomic History  . Marital status: Divorced    Spouse name: Not on file  . Number of children: 2  . Years of education: college  . Highest education level: Bachelor's degree (e.g., BA, AB, BS)  Occupational History  . Occupation: retired  Engineer, production  . Financial resource strain: Patient refused  . Food insecurity:    Worry: Patient refused    Inability: Patient refused  . Transportation needs:    Medical: Patient refused    Non-medical: Patient refused  Tobacco Use  . Smoking status: Never Smoker  . Smokeless tobacco: Never Used  Substance and Sexual Activity  . Alcohol use: No  . Drug use: No  . Sexual activity: Never  Lifestyle  . Physical activity:    Days per week: 0 days    Minutes per session: 0 min  . Stress: Not at all  Relationships  . Social connections:    Talks on phone: More than three times a week    Gets together: More than three times a week    Attends religious service: Never    Active member of club or organization: No    Attends meetings of clubs or organizations: Never    Relationship status: Divorced  . Intimate partner violence:    Fear of current or ex partner: Patient refused    Emotionally  abused: Patient refused    Physically abused: Patient refused    Forced sexual activity: Patient refused  Other Topics Concern  . Not on file  Social History Narrative   Pt says that he lives in [place] with his sister, though notes indicate that he lives in an assisted living facitliy.  He does not routinely exercise.    FAMILY HISTORY:  Family History  Problem Relation Age of Onset  . Stroke Mother   . Cancer Father      REVIEW OF SYSTEMS:  Constitutional: denies weight loss, fever, chills, or sweats  Eyes: denies any other vision changes, history of eye injury  ENT: denies sore throat, hearing problems  Respiratory: denies shortness of breath, wheezing  Cardiovascular: denies chest pain, palpitations  Gastrointestinal: abdominal pain, N/V, and bowel function as per HPI Genitourinary: denies burning with urination or urinary frequency Musculoskeletal: denies any  other joint pains or cramps  Skin: denies any other rashes or skin discolorations  Neurological: reports overall weakness, denies any other headache or dizziness  Psychiatric: denies any other depression, anxiety   All other review of systems were negative   VITAL SIGNS:  Temp:  [98.2 F (36.8 C)-98.6 F (37 C)] 98.2 F (36.8 C) (08/16 0113) Pulse Rate:  [52-103] 69 (08/16 0113) Resp:  [16-18] 18 (08/16 0015) BP: (120-137)/(69-88) 137/74 (08/16 0113) SpO2:  [94 %-99 %] 99 % (08/16 0113) Weight:  [89 kg] 89 kg (08/15 1159)     Height: 5\' 8"  (172.7 cm) Weight: 89 kg BMI (Calculated): 29.84   INTAKE/OUTPUT:  This shift: No intake/output data recorded.  Last 2 shifts: @IOLAST2SHIFTS @   PHYSICAL EXAM:  Constitutional:  -- Normal body habitus  -- Awake, alert, and oriented x3, no apparent distress Eyes:  -- Pupils equally round and reactive to light  -- No scleral icterus, B/L no occular discharge Ear, nose, throat: -- Neck is FROM WNL -- No jugular venous distension  Pulmonary:  -- No wheezes or  rhales -- Equal breath sounds bilaterally -- Breathing non-labored at rest Cardiovascular:  -- S1, S2 present  -- No pericardial rubs  Gastrointestinal:  -- Abdomen soft, completely nontender, and non-distended with no guarding or rebound tenderness -- No abdominal masses appreciated, pulsatile or otherwise  Musculoskeletal and Integumentary:  -- Wounds or skin discoloration: None appreciated -- Extremities: B/L UE and LE FROM, hands and feet warm Neurologic:  -- Motor function: Intact and symmetric -- Sensation: Intact and symmetric Psychiatric:  -- Mood and affect WNL  Labs:  CBC Latest Ref Rng & Units 03/23/2018 03/22/2018 03/19/2018  WBC 3.8 - 10.6 K/uL 13.8(H) 15.3(H) 10.6  Hemoglobin 13.0 - 18.0 g/dL 12.2(L) 13.6 13.6  Hematocrit 40.0 - 52.0 % 35.7(L) 39.9(L) 39.1(L)  Platelets 150 - 440 K/uL 168 203 127(L)   CMP Latest Ref Rng & Units 03/23/2018 03/22/2018 03/19/2018  Glucose 70 - 99 mg/dL 098(J140(H) 191(Y172(H) 782(N158(H)  BUN 8 - 23 mg/dL 6(L) 7(L) 13  Creatinine 0.61 - 1.24 mg/dL 5.620.94 1.301.07 8.650.99  Sodium 135 - 145 mmol/L 139 139 138  Potassium 3.5 - 5.1 mmol/L 3.6 4.1 3.7  Chloride 98 - 111 mmol/L 108 109 110  CO2 22 - 32 mmol/L 27 23 21(L)  Calcium 8.9 - 10.3 mg/dL 8.3(L) 8.8(L) 7.8(L)  Total Protein 6.5 - 8.1 g/dL - 6.0(L) -  Total Bilirubin 0.3 - 1.2 mg/dL - 0.9 -  Alkaline Phos 38 - 126 U/L - 145(H) -  AST 15 - 41 U/L - 84(H) -  ALT 0 - 44 U/L - 67(H) -   Imaging studies:  CT Abdomen and Pelvis with Contrast (03/23/2018) - personally reviewed and discussed with ED physician, hospitalist, and patient 1. Mild persistent diffuse distention of small bowel in the mid abdomen, with partial interval improvement when compared with the prior CT of the abdomen and pelvis. Findings may represent infectious/inflammatory enteritis or possibly a partial obstruction. 2. Two stable hypodense lesions within the pancreas body and tail measuring up to 19 mm. Further characterization with  pancreas protocol CT or MRI is recommended on a nonemergent basis. 3. New 26 mm hypodense lesion that appears centered within anterior wall of the third segment of duodenum, possibly a duodenal ulcer or inflamed diverticulum. 4. Aortic atherosclerosis. 5. Cholelithiasis. 6. Aortic, aortic valvular, and coronary artery calcific Atherosclerosis.  CTA Abdomen and Pelvis (03/17/2018) - personally reviewed and discussed with ED physician VASCULAR  1. No significant proximal mesenteric arterial occlusive disease to suggest any etiology of occlusive mesenteric ischemia. 2. No evidence of mesenteric venous thrombosis. 3.  Aortic Atherosclerosis (ICD10-170.0) without aneurysm.  NON-VASCULAR 1. Multiple dilated mid small bowel loops without evident transition point or other etiology, suggesting infectious or ischemic enteritis. 2. Ill-defined low-attenuation regions in the pancreatic body and tail with adjacent inflammatory/edematous change. These were not conspicuous on the previous unenhanced CT. May be inflammatory/ postinflammatory or neoplastic. If there is no clinical or laboratory  evidence of pancreatitis, consider further evaluation with dedicated  abdominal MRI when the patient is clinically stable and able to follow  directions and hold their breath (preferably as an outpatient). 3. Cholelithiasis 4. Sigmoid diverticulosis.  Assessment/Plan: (ICD-10's: R19.7, 66K86.2) 77 y.o. 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   - IV hydration for now  - utility of antibiotics unclear, trend WBC  - consider GI consultation for pancreatic lesions with chronic diarrhea  - medical management of comorbidities as per medical team  - no indication for surgical intervention at this time  - DVT prophylaxis  All of the above findings and recommendations were discussed with the patient, ED physician, and hospitalist.  Thank you for the opportunity to participate in this patient's care.   -- Scherrie GerlachJason E. Earlene Plateravis, MD, RPVI [place]: [facility] General Surgery - Partnering for exceptional care. Office: [phone]

## 2018-03-23 NOTE — Progress Notes (Signed)
Pharmacy Antibiotic Note  Alvin PeckJohn [name] is a 77 y.o. male admitted on 03/22/2018 with intra-abdominal infection.  Pharmacy has been consulted for zosyn dosing.  Plan: Zosyn 3.375g IV q8h (4 hour infusion).  Height: 5\' 8"  (172.7 cm) Weight: 196 lb 3.4 oz (89 kg) IBW/kg (Calculated) : 68.4  Temp (24hrs), Avg:98.4 F (36.9 C), Min:98.2 F (36.8 C), Max:98.6 F (37 C)  Recent Labs  Lab 03/17/18 0753 03/17/18 0754 03/17/18 1123 03/18/18 0346 03/19/18 0512 03/22/18 1203 03/23/18 0425  WBC 12.3*  --   --  12.3* 10.6 15.3* 13.8*  CREATININE 1.81*  --   --  1.20 0.99 1.07 0.94  LATICACIDVEN  --  5.8* 2.0*  --   --   --   --     Estimated Creatinine Clearance: 72.4 mL/min (by C-G formula based on SCr of 0.94 mg/dL).    No Known Allergies  Thank you for allowing pharmacy to be a part of this patient's care.  Mauri ReadingSavanna M Martin, PharmD Pharmacy Resident  03/23/2018 10:13 AM

## 2018-03-23 NOTE — NC FL2 (Signed)
[place] MEDICAID FL2 LEVEL OF CARE SCREENING TOOL     IDENTIFICATION  Patient Name: Alvin PeckJohn Marcy Birthdate: 10/20/1940 Sex: male Admission Date (Current Location): 03/22/2018  [facility] [place]   Facility and Address:  [facility], [address], [place], [place] [address]      Provider Number: [id number]  Attending Physician Name and Address:  Milagros LollSudini, Srikar, MD  Relative Name and Phone Number:       Current Level of Care: Hospital Recommended Level of Care: Assisted Living Facility Prior Approval Number:    Date Approved/Denied:   PASRR Number:    Discharge Plan: Domiciliary (Rest home)    Current Diagnoses: Patient Active Problem List   Diagnosis Date Noted  . Colitis 03/23/2018  . Acute colitis 03/22/2018  . Gastroenteritis 03/17/2018  . Sepsis (HCC) 03/23/2017  . Subacute delirium 03/07/2017  . Bipolar disorder (HCC) 03/07/2017  . Nonocclusive intestinal infarction (HCC)   . Acute respiratory failure with hypoxemia (HCC)   . Abdominal distention   . Lactic acidosis   . Elevated lipase   . Severe sepsis with septic shock (HCC) 02/28/2017    Orientation RESPIRATION BLADDER Height & Weight     Self, Place  Normal Incontinent Weight: 196 lb 3.4 oz (89 kg) Height:  5\' 8"  (172.7 cm)  BEHAVIORAL SYMPTOMS/MOOD NEUROLOGICAL BOWEL NUTRITION STATUS      Incontinent Diet(Diet: NPO to be advanced. )  AMBULATORY STATUS COMMUNICATION OF NEEDS Skin   Supervision Verbally Normal                       Personal Care Assistance Level of Assistance  Bathing, Feeding, Dressing Bathing Assistance: Limited assistance Feeding assistance: Independent Dressing Assistance: Limited assistance     Functional Limitations Info  Sight, Hearing, Speech Sight Info: Adequate Hearing Info: Adequate Speech Info: Adequate    SPECIAL CARE FACTORS FREQUENCY  PT (By licensed PT)     PT Frequency: (2-3 home health )               Contractures      Additional Factors Info  Code Status, Allergies Code Status Info: (Full Code. ) Allergies Info: (no Known Allergies. )           Current Medications (03/23/2018):  This is the current hospital active medication list Current Facility-Administered Medications  Medication Dose Route Frequency Provider Last Rate Last Dose  . 0.9 %  sodium chloride infusion   Intravenous Continuous Cammy CopaMaier, Angela, MD 75 mL/hr at 03/23/18 0223 75 mL/hr at 03/23/18 0223  . acetaminophen (TYLENOL) tablet 650 mg  650 mg Oral Q6H PRN Cammy CopaMaier, Angela, MD   650 mg at 03/23/18 [id number]   Or  . acetaminophen (TYLENOL) suppository 650 mg  650 mg Rectal Q6H PRN Cammy CopaMaier, Angela, MD      . allopurinol (ZYLOPRIM) tablet 100 mg  100 mg Oral Daily Cammy CopaMaier, Angela, MD      . aspirin chewable tablet 81 mg  81 mg Oral Daily Cammy CopaMaier, Angela, MD      . bisacodyl (DULCOLAX) EC tablet 5 mg  5 mg Oral Daily PRN Cammy CopaMaier, Angela, MD      . cholecalciferol (VITAMIN D) tablet 2,000 Units  2,000 Units Oral Daily Cammy CopaMaier, Angela, MD      . docusate sodium (COLACE) capsule 100 mg  100 mg Oral BID Cammy CopaMaier, Angela, MD      . fenofibrate tablet 54 mg  54 mg Oral Daily Caryn BeeMaier,  Marylene LandAngela, MD      . heparin injection 5,000 Units  5,000 Units Subcutaneous Q8H Cammy CopaMaier, Angela, MD   5,000 Units at 03/23/18 0535  . HYDROcodone-acetaminophen (NORCO/VICODIN) 5-325 MG per tablet 1-2 tablet  1-2 tablet Oral Q4H PRN Cammy CopaMaier, Angela, MD      . insulin aspart (novoLOG) injection 0-5 Units  0-5 Units Subcutaneous QHS Cammy CopaMaier, Angela, MD      . insulin aspart (novoLOG) injection 0-9 Units  0-9 Units Subcutaneous TID WC Cammy CopaMaier, Angela, MD      . lisinopril (PRINIVIL,ZESTRIL) tablet 5 mg  5 mg Oral Daily Cammy CopaMaier, Angela, MD      . multivitamin with minerals tablet 1 tablet  1 tablet Oral Daily Cammy CopaMaier, Angela, MD      . OLANZapine [facility](ZYPREXA) tablet 10 mg  10 mg Oral QHS Cammy CopaMaier, Angela, MD      . ondansetron [facility](ZOFRAN) tablet 4 mg  4 mg Oral Q6H PRN Cammy CopaMaier, Angela, MD        Or  . ondansetron [facility](ZOFRAN) injection 4 mg  4 mg Intravenous Q6H PRN Cammy CopaMaier, Angela, MD      . pantoprazole (PROTONIX) EC tablet 40 mg  40 mg Oral Daily Cammy CopaMaier, Angela, MD      . piperacillin-tazobactam (ZOSYN) IVPB 3.375 g  3.375 g Intravenous Roxanna [id number] Maier, Angela, MD 12.5 mL/hr at 03/23/18 0525 3.375 g at 03/23/18 0525  . terazosin (HYTRIN) capsule 5 mg  5 mg Oral QHS Cammy CopaMaier, Angela, MD      . thiamine (VITAMIN B-1) tablet 100 mg  100 mg Oral Daily Cammy CopaMaier, Angela, MD      . traZODone (DESYREL) tablet 25 mg  25 mg Oral QHS PRN Cammy CopaMaier, Angela, MD      . vitamin B-12 (CYANOCOBALAMIN) tablet 1,000 mcg  1,000 mcg Oral Daily Cammy CopaMaier, Angela, MD         Discharge Medications: Please see discharge summary for a list of discharge medications.  Relevant Imaging Results:  Relevant Lab Results:   Additional Information (SSN: [id number])  Kryslyn Helbig, Darleen CrockerBailey M, LCSW

## 2018-03-23 NOTE — Consult Note (Signed)
Alvin Darby, MD [address]  [place]  [place], [place] [address]  Main: [phone]  Fax: [phone] Pager: [phone]   Consultation  Referring Provider:     No ref. provider found Primary Care Physician:  [place] Primary Gastroenterologist:  Dr. Sherri Sear         Reason for Consultation:     Abnormal CT of the duodenum, questionable duodenal ulcer Date of Admission:  03/22/2018 Date of Consultation:  03/23/2018         HPI:   Bush Murdoch is a 77 y.o. Caucasian male with multiple comorbidities as listed below, who is admitted from assisted living secondary to recurrence of diarrhea. He was recently discharged from [facility] about 4 days ago due to hypotension from hypovolemia, resulting from acute diarrhea which was thought to be viral gastroenteritis. His stool studies were negative for C. difficile and other GI pathogens. Due to elevated lactate and leukocytosis, he underwent CT angiogram of the abdomen and pelvis concerning for mesenteric ischemia which was negative. His diarrhea improved during the hospital stay and he was discharged back to assisted-living facility. [facility] to [facility] secondary to recurrence of diarrhea, nonbloody, occurring every 3-4 hours associated with abdominal cramps. He underwent repeat CT abdomen and pelvis today which revealed new hypodense lesion measuring up to 2.6 cm in the third segment of the duodenum associated with mild diffuse distention of the small bowel in the mid abdomen without a transition point. Therefore, GI is consulted for further evaluation. General surgery was consulted for partial small bowel obstruction who did not recommend any surgical intervention at this time. Patient is empirically started on Zosyn for possible intra-abdominal infection  Patient also has chronically abnormal LFTs for the last 1 year, CT abdomen revealed nodularity of the liver confirming cirrhosis of  the liver, unclear if patient has history of alcohol use.   Of note, patient underwent laparotomy just over one year ago for diffuse non-focal low-flow small bowel ischemia in the context of aspiration pneumonia following a period of diarrhea. There were no small bowel diverticuli appreciated at that time, and small bowel resection was not required.  On interview, patient denies abdominal pain, nausea and vomiting. He is requesting to eat. He thinks that his diarrhea is hardening up now  NSAIDs: None  Antiplts/Anticoagulants/Anti thrombotics: Aspirin 81  GI Procedures: Unknown  Past Medical History:  Diagnosis Date  . Bipolar disorder ([place])   . BPH (benign prostatic hyperplasia)   . Cardiomyopathy ([place])    a. 03/2017 Echo: EF 79%, mild diastolic dysfxn. Mild MR.  . Diabetes mellitus without complication ([place])   . Gout   . 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  . Hyperlipidemia   . Hypertension     Past Surgical History:  Procedure Laterality Date  . LAPAROTOMY N/A 03/01/2017   Procedure: EXPLORATORY LAPAROTOMY;  Surgeon: Vickie Epley, MD;  Location: [facility] ORS;  Service: General;  Laterality: N/A;    Prior to Admission medications   Medication Sig Start Date End Date Taking? Authorizing Provider  allopurinol (ZYLOPRIM) 100 MG tablet Take 100 mg by mouth daily.   Yes [provider]  aspirin 81 MG chewable tablet Chew 1 tablet (81 mg total) by mouth daily. 03/24/17  Yes Awilda Bill, NP  cholecalciferol (VITAMIN D) 1000 units tablet Take 2,000 Units by mouth daily.   Yes [provider]  fenofibrate (  TRICOR) 145 MG tablet Take 145 mg by mouth daily.   Yes [provider]  lisinopril (PRINIVIL,ZESTRIL) 5 MG tablet Take 5 mg by mouth daily.   Yes [provider]  metFORMIN (GLUCOPHAGE) 500 MG tablet Take 500 mg by mouth 2 (two) times daily.   Yes [provider]  Multiple Vitamins-Minerals (MULTIVITAMINS  THER. W/MINERALS) TABS tablet Take 1 tablet by mouth daily.   Yes [provider]  OLANZapine (ZYPREXA) 10 MG tablet Take 10 mg by mouth at bedtime.   Yes [provider]  omeprazole (PRILOSEC) 20 MG capsule Take 20 mg by mouth every morning.   Yes [provider]  terazosin (HYTRIN) 5 MG capsule Take 5 mg by mouth at bedtime.   Yes [provider]  thiamine 100 MG tablet Take 1 tablet (100 mg total) by mouth daily. 03/24/17  Yes Awilda Bill, NP  vitamin B-12 1000 MCG tablet Take 1 tablet (1,000 mcg total) by mouth daily. 03/24/17  Yes Awilda Bill, NP  acetaminophen (TYLENOL) 325 MG tablet Take 2 tablets (650 mg total) by mouth every 6 (six) hours as needed for mild pain (or Fever >/= 101). 03/19/18   Gouru, Illene Silver, MD  cephALEXin (KEFLEX) 500 MG capsule Take 1 capsule (500 mg total) by mouth 2 (two) times daily for 5 days. 03/22/18 03/27/18  Delman Kitten, MD  divalproex (DEPAKOTE SPRINKLE) 125 MG capsule Take 2 capsules (250 mg total) by mouth every morning. Patient not taking: Reported on 03/17/2018 03/24/17   Awilda Bill, NP  ondansetron (ZOFRAN) 4 MG tablet Take 1 tablet (4 mg total) by mouth every 6 (six) hours as needed for nausea. Patient taking differently: Take 4 mg by mouth every 6 (six) hours as needed.  03/23/17   Awilda Bill, NP  polyethylene glycol (MIRALAX / GLYCOLAX) packet Take 17 g by mouth daily as needed for mild constipation or moderate constipation.    [provider]    Current Facility-Administered Medications:  .  0.9 %  sodium chloride infusion, , Intravenous, Continuous, Amelia Jo, MD, Last Rate: 75 mL/hr at 03/23/18 1600 .  acetaminophen (TYLENOL) tablet 650 mg, 650 mg, Oral, Q6H PRN, 650 mg at 03/23/18 0526 **OR** acetaminophen (TYLENOL) suppository 650 mg, 650 mg, Rectal, Q6H PRN, Amelia Jo, MD .  allopurinol (ZYLOPRIM) tablet 100 mg, 100 mg, Oral, Daily, Amelia Jo, MD, 100 mg at 03/23/18 1000 .   aspirin chewable tablet 81 mg, 81 mg, Oral, Daily, Amelia Jo, MD, 81 mg at 03/23/18 1000 .  bisacodyl (DULCOLAX) EC tablet 5 mg, 5 mg, Oral, Daily PRN, Amelia Jo, MD .  cholecalciferol (VITAMIN D) tablet 2,000 Units, 2,000 Units, Oral, Daily, Amelia Jo, MD, 2,000 Units at 03/23/18 1000 .  docusate sodium (COLACE) capsule 100 mg, 100 mg, Oral, BID, Amelia Jo, MD, 100 mg at 03/23/18 1000 .  fenofibrate tablet 54 mg, 54 mg, Oral, Daily, Amelia Jo, MD, 54 mg at 03/23/18 1000 .  heparin injection 5,000 Units, 5,000 Units, Subcutaneous, Q8H, Amelia Jo, MD, 5,000 Units at 03/23/18 1634 .  HYDROcodone-acetaminophen (NORCO/VICODIN) 5-325 MG per tablet 1-2 tablet, 1-2 tablet, Oral, Q4H PRN, Amelia Jo, MD .  insulin aspart (novoLOG) injection 0-5 Units, 0-5 Units, Subcutaneous, QHS, Amelia Jo, MD .  insulin aspart (novoLOG) injection 0-9 Units, 0-9 Units, Subcutaneous, TID WC, Amelia Jo, MD .  lisinopril (PRINIVIL,ZESTRIL) tablet 5 mg, 5 mg, Oral, Daily, Amelia Jo, MD, 5 mg at 03/23/18 1000 .  multivitamin with minerals  tablet 1 tablet, 1 tablet, Oral, Daily, Amelia Jo, MD, 1 tablet at 03/23/18 1000 .  OLANZapine (ZYPREXA) tablet 10 mg, 10 mg, Oral, QHS, Amelia Jo, MD .  ondansetron [facility]) tablet 4 mg, 4 mg, Oral, Q6H PRN **OR** ondansetron (ZOFRAN) injection 4 mg, 4 mg, Intravenous, Q6H PRN, Amelia Jo, MD .  pantoprazole (PROTONIX) EC tablet 40 mg, 40 mg, Oral, BID, Vanga, Tally Due, MD .  piperacillin-tazobactam (ZOSYN) IVPB 3.375 g, 3.375 g, Intravenous, Q8H, Amelia Jo, MD, Last Rate: 12.5 mL/hr at 03/23/18 1500, 3.375 g at 03/23/18 1500 .  terazosin (HYTRIN) capsule 5 mg, 5 mg, Oral, QHS, Amelia Jo, MD .  thiamine (VITAMIN B-1) tablet 100 mg, 100 mg, Oral, Daily, Amelia Jo, MD, 100 mg at 03/23/18 1000 .  traZODone (DESYREL) tablet 25 mg, 25 mg, Oral, QHS PRN, Amelia Jo, MD .  vitamin B-12 (CYANOCOBALAMIN) tablet 1,000 mcg, 1,000 mcg,  Oral, Daily, Amelia Jo, MD, 1,000 mcg at 03/23/18 1000  Family History  Problem Relation Age of Onset  . Stroke Mother   . Cancer Father      Social History   Tobacco Use  . Smoking status: Never Smoker  . Smokeless tobacco: Never Used  Substance Use Topics  . Alcohol use: No  . Drug use: No    Allergies as of 03/22/2018  . (No Known Allergies)    Review of Systems:    All systems reviewed and negative except where noted in HPI.   Physical Exam:  Vital signs in last 24 hours: Temp:  [97.7 F (36.5 C)-98.3 F (36.8 C)] 98 F (36.7 C) (08/16 1556) Pulse Rate:  [59-82] 64 (08/16 1556) Resp:  [18-20] 20 (08/16 1556) BP: (121-142)/(74-88) 121/76 (08/16 1556) SpO2:  [94 %-99 %] 95 % (08/16 1556) Last BM Date: 03/23/18 General:   Pleasant, cooperative in NAD Head:  Normocephalic and atraumatic. Eyes:   No icterus.   Conjunctiva pink. PERRLA. Ears:  Normal auditory acuity. Neck:  Supple; no masses or thyroidomegaly Lungs: Respirations even and unlabored. Lungs clear to auscultation bilaterally.   No wheezes, crackles, or rhonchi.  Heart:  Regular rate and rhythm;  Without murmur, clicks, rubs or gallops Abdomen:  Soft, nondistended, nontender. Normal bowel sounds. No appreciable masses or hepatomegaly.  No rebound or guarding.  Rectal:  Not performed. Msk:  Symmetrical without gross deformities.  Extremities:  Without edema, cyanosis or clubbing. Neurologic:  Alert and oriented x3;  grossly normal neurologically. Skin:  Intact without significant lesions or rashes. Cervical Nodes:  No significant cervical adenopathy. Psych:  Alert and cooperative. Normal affect.  LAB RESULTS: CBC Latest Ref Rng & Units 03/23/2018 03/22/2018 03/19/2018  WBC 3.8 - 10.6 K/uL 13.8(H) 15.3(H) 10.6  Hemoglobin 13.0 - 18.0 g/dL 12.2(L) 13.6 13.6  Hematocrit 40.0 - 52.0 % 35.7(L) 39.9(L) 39.1(L)  Platelets 150 - 440 K/uL 168 203 127(L)    BMET BMP Latest Ref Rng & Units 03/23/2018  03/22/2018 03/19/2018  Glucose 70 - 99 mg/dL 140(H) 172(H) 158(H)  BUN 8 - 23 mg/dL 6(L) 7(L) 13  Creatinine 0.61 - 1.24 mg/dL 0.94 1.07 0.99  Sodium 135 - 145 mmol/L 139 139 138  Potassium 3.5 - 5.1 mmol/L 3.6 4.1 3.7  Chloride 98 - 111 mmol/L 108 109 110  CO2 22 - 32 mmol/L 27 23 21(L)  Calcium 8.9 - 10.3 mg/dL 8.3(L) 8.8(L) 7.8(L)    LFT Hepatic Function Latest Ref Rng & Units 03/22/2018 03/17/2018 03/23/2017  Total Protein 6.5 - 8.1 g/dL 6.0(L)  6.1(L) 5.6(L)  Albumin 3.5 - 5.0 g/dL 3.0(L) 3.1(L) 2.2(L)  AST 15 - 41 U/L 84(H) 46(H) 105(H)  ALT 0 - 44 U/L 67(H) 22 84(H)  Alk Phosphatase 38 - 126 U/L 145(H) 57 255(H)  Total Bilirubin 0.3 - 1.2 mg/dL 0.9 1.0 0.8     STUDIES: Ct Abdomen Pelvis W Contrast  Result Date: 03/22/2018 CLINICAL DATA:  77 y/o M; abdominal cramping with unresolved diarrhea for the past week. Recent admission 8/10-8/12 for the same. EXAM: CT ABDOMEN AND PELVIS WITH CONTRAST TECHNIQUE: Multidetector CT imaging of the abdomen and pelvis was performed using the standard protocol following bolus administration of intravenous contrast. CONTRAST:  129m ISOVUE-300 IOPAMIDOL (ISOVUE-300) INJECTION 61% COMPARISON:  03/17/2018 CT abdomen and pelvis. FINDINGS: Lower chest: Severe coronary artery calcific atherosclerosis. Moderate calcification of the aortic valve. Two lead pacemaker noted. Hepatobiliary: Stable mild nodularity to the liver contour. Cholelithiasis. No gallbladder wall thickening or biliary ductal dilatation. Pancreas: Stable hypodense lesions within the body and tail of the pancreas measuring 13 and 19 mm (series 5, image 50 and 56). No main duct dilatation. Spleen: Normal in size without focal abnormality. Adrenals/Urinary Tract: Adrenal glands are unremarkable. Kidneys are normal, without renal calculi, focal lesion, or hydronephrosis. Bladder is unremarkable. Stomach/Bowel: New hypodense lesion within the measuring up to 26 mm which appears to be centered within  the anterior wall of the third segment of duodenum (series 2, image 46 and series 6, image 60). Mild diffuse distention of small bowel in the mid abdomen without a transition point. Small bowel dilatation is mildly improved compared with the prior CT of abdomen and pelvis. Normal appendix. Normal appearance of the stomach. Vascular/Lymphatic: Aortic atherosclerosis. No enlarged abdominal or pelvic lymph nodes. Reproductive: Moderate prostate enlargement extending into the floor of the bladder. Other: No abdominal wall hernia or abnormality. No abdominopelvic ascites. Musculoskeletal: No acute or significant osseous findings. IMPRESSION: 1. Mild persistent diffuse distention of small bowel in the mid abdomen, with partial interval improvement when compared with the prior CT of the abdomen and pelvis. Findings may represent infectious/inflammatory enteritis or possibly a partial obstruction. 2. Two stable hypodense lesions within the pancreas body and tail measuring up to 19 mm. Further characterization with pancreas protocol CT or MRI is recommended on a nonemergent basis. 3. New 26 mm hypodense lesion that appears centered within anterior wall of the third segment of duodenum, possibly a duodenal ulcer or inflamed diverticulum. 4. Aortic atherosclerosis. 5. Cholelithiasis. 6. Aortic, aortic valvular, and coronary artery calcific atherosclerosis. Electronically Signed   By: LKristine GarbeM.D.   On: 03/22/2018 19:35   Dg Abdomen Acute W/chest  Result Date: 03/22/2018 CLINICAL DATA:  2-3 day history of nausea. One-week history of diarrhea. EXAM: DG ABDOMEN ACUTE W/ 1V CHEST COMPARISON:  CT abdomen and pelvis 03/17/2018. Chest x-rays 03/17/2018 and earlier. Abdominal x-ray 03/23/2017. FINDINGS: Multiple dilated loops of small bowel throughout the upper abdomen. Gas and expected stool burden throughout the normal caliber colon. Scattered air-fluid levels on the ERECT view. No evidence of free  intraperitoneal air. No visible opaque urinary tract calculi. Cardiac silhouette normal in size, unchanged. LEFT subclavian dual lead transvenous pacemaker unchanged and intact. Mild atelectasis at the LEFT lung base, new. Lungs otherwise clear. IMPRESSION: 1. Partial small bowel obstruction. No evidence of free intraperitoneal air. 2. Mild atelectasis involving the LEFT LOWER LOBE. No acute cardiopulmonary disease otherwise. Electronically Signed   By: TEvangeline DakinM.D.   On: 03/22/2018 16:14  Impression / Plan:   Vicent Febles is a 77 y.o. Caucasian male with metabolic syndrome, cardiomyopathy, pacemaker, cirrhosis of liver admitted with 2 week history of nonbloody diarrhea. Infectious workup has been negative. CT revealed a lesion in the third portion of duodenum. Differentials include duodenal varix or submucosal lesion or diverticulum or duodenal ulcer.   Abnormal CT of the duodenum - Recommend EGD for further evaluation - Protonix 40 mg daily - Okay with clear liquid diet - Nothing by mouth past midnight   Subacute diarrhea: Infectious workup negative  Check fecal calprotectin TTG IgA Pancreatic fecal elastase  TSH Recommend colonoscopy with biopsies if diarrhea persists  Cirrhosis of liver: Compensated, probably [place] etiology Check viral hepatitis panel Rest of the secondary liver disease workup as outpatient Mild normocytic anemia, stable Does not appear to be volume overloaded, no evidence of ascites Mild coagulopathy   Thank you for involving me in the care of this patient.  Dr. Vicente Males to cover for the weekend     LOS: 0 days   Sherri Sear, MD  03/23/2018, 7:59 PM   Note: This dictation was prepared with Dragon dictation along with smaller phrase technology. Any transcriptional errors that result from this process are unintentional.

## 2018-03-23 NOTE — Care Management (Signed)
Patient presents from [facility] assisted [facility].  Anticipate return.  He is currently followed by [facility] RN

## 2018-03-23 NOTE — Progress Notes (Signed)
[employer] -  at [facility]   PATIENT NAME: Lorenda PeckJohn Wager    MR#:  [id number]  DATE OF BIRTH:  10/21/1940  SUBJECTIVE:  CHIEF COMPLAINT:   Chief Complaint  Patient presents with  . Diarrhea   No diarrhea.  Continues to have some cramping abdominal pain.  Afebrile.  REVIEW OF SYSTEMS:    Review of Systems  Constitutional: Positive for malaise/fatigue. Negative for chills and fever.  HENT: Negative for sore throat.   Eyes: Negative for blurred vision, double vision and pain.  Respiratory: Negative for cough, hemoptysis, shortness of breath and wheezing.   Cardiovascular: Negative for chest pain, palpitations, orthopnea and leg swelling.  Gastrointestinal: Positive for abdominal pain. Negative for constipation, diarrhea, heartburn, nausea and vomiting.  Genitourinary: Negative for dysuria and hematuria.  Musculoskeletal: Negative for back pain and joint pain.  Skin: Negative for rash.  Neurological: Negative for sensory change, speech change, focal weakness and headaches.  Endo/Heme/Allergies: Does not bruise/bleed easily.  Psychiatric/Behavioral: Negative for depression. The patient is not nervous/anxious.     DRUG ALLERGIES:  No Known Allergies  VITALS:  Blood pressure (!) 142/84, pulse 60, temperature 97.7 F (36.5 C), temperature source Oral, resp. rate 18, height 5\' 8"  (1.727 m), weight 89 kg, SpO2 95 %.  PHYSICAL EXAMINATION:   Physical Exam  GENERAL:  77 y.o.-year-old patient lying in the bed with no acute distress.  EYES: Pupils equal, round, reactive to light and accommodation. No scleral icterus. Extraocular muscles intact.  HEENT: Head atraumatic, normocephalic. Oropharynx and nasopharynx clear.  NECK:  Supple, no jugular venous distention. No thyroid enlargement, no tenderness.  LUNGS: Normal breath sounds bilaterally, no wheezing, rales, rhonchi. No use of accessory muscles of respiration.  CARDIOVASCULAR: S1, S2 normal. No murmurs, rubs,  or gallops.  ABDOMEN: Soft, nontender, nondistended. Bowel sounds present. No organomegaly or mass.  EXTREMITIES: No cyanosis, clubbing or edema b/l.    NEUROLOGIC: Cranial nerves II through XII are intact. No focal Motor or sensory deficits b/l.   PSYCHIATRIC: The patient is alert and awake SKIN: No obvious rash, lesion, or ulcer.   LABORATORY PANEL:   CBC Recent Labs  Lab 03/23/18 0425  WBC 13.8*  HGB 12.2*  HCT 35.7*  PLT 168   ------------------------------------------------------------------------------------------------------------------ Chemistries  Recent Labs  Lab 03/22/18 1203 03/23/18 0425  NA 139 139  K 4.1 3.6  CL 109 108  CO2 23 27  GLUCOSE 172* 140*  BUN 7* 6*  CREATININE 1.07 0.94  CALCIUM 8.8* 8.3*  AST 84*  --   ALT 67*  --   ALKPHOS 145*  --   BILITOT 0.9  --    ------------------------------------------------------------------------------------------------------------------  Cardiac Enzymes Recent Labs  Lab 03/17/18 0753  TROPONINI 0.03*   ------------------------------------------------------------------------------------------------------------------  RADIOLOGY:  Ct Abdomen Pelvis W Contrast  Result Date: 03/22/2018 CLINICAL DATA:  77 y/o M; abdominal cramping with unresolved diarrhea for the past week. Recent admission 8/10-8/12 for the same. EXAM: CT ABDOMEN AND PELVIS WITH CONTRAST TECHNIQUE: Multidetector CT imaging of the abdomen and pelvis was performed using the standard protocol following bolus administration of intravenous contrast. CONTRAST:  100mL ISOVUE-300 IOPAMIDOL (ISOVUE-300) INJECTION 61% COMPARISON:  03/17/2018 CT abdomen and pelvis. FINDINGS: Lower chest: Severe coronary artery calcific atherosclerosis. Moderate calcification of the aortic valve. Two lead pacemaker noted. Hepatobiliary: Stable mild nodularity to the liver contour. Cholelithiasis. No gallbladder wall thickening or biliary ductal dilatation. Pancreas: Stable  hypodense lesions within the body and tail of the pancreas measuring 13 and  19 mm (series 5, image 50 and 56). No main duct dilatation. Spleen: Normal in size without focal abnormality. Adrenals/Urinary Tract: Adrenal glands are unremarkable. Kidneys are normal, without renal calculi, focal lesion, or hydronephrosis. Bladder is unremarkable. Stomach/Bowel: New hypodense lesion within the measuring up to 26 mm which appears to be centered within the anterior wall of the third segment of duodenum (series 2, image 46 and series 6, image 60). Mild diffuse distention of small bowel in the mid abdomen without a transition point. Small bowel dilatation is mildly improved compared with the prior CT of abdomen and pelvis. Normal appendix. Normal appearance of the stomach. Vascular/Lymphatic: Aortic atherosclerosis. No enlarged abdominal or pelvic lymph nodes. Reproductive: Moderate prostate enlargement extending into the floor of the bladder. Other: No abdominal wall hernia or abnormality. No abdominopelvic ascites. Musculoskeletal: No acute or significant osseous findings. IMPRESSION: 1. Mild persistent diffuse distention of small bowel in the mid abdomen, with partial interval improvement when compared with the prior CT of the abdomen and pelvis. Findings may represent infectious/inflammatory enteritis or possibly a partial obstruction. 2. Two stable hypodense lesions within the pancreas body and tail measuring up to 19 mm. Further characterization with pancreas protocol CT or MRI is recommended on a nonemergent basis. 3. New 26 mm hypodense lesion that appears centered within anterior wall of the third segment of duodenum, possibly a duodenal ulcer or inflamed diverticulum. 4. Aortic atherosclerosis. 5. Cholelithiasis. 6. Aortic, aortic valvular, and coronary artery calcific atherosclerosis. Electronically Signed   By: Mitzi HansenLance  Furusawa-Stratton M.D.   On: 03/22/2018 19:35   Dg Abdomen Acute W/chest  Result Date:  03/22/2018 CLINICAL DATA:  2-3 day history of nausea. One-week history of diarrhea. EXAM: DG ABDOMEN ACUTE W/ 1V CHEST COMPARISON:  CT abdomen and pelvis 03/17/2018. Chest x-rays 03/17/2018 and earlier. Abdominal x-ray 03/23/2017. FINDINGS: Multiple dilated loops of small bowel throughout the upper abdomen. Gas and expected stool burden throughout the normal caliber colon. Scattered air-fluid levels on the ERECT view. No evidence of free intraperitoneal air. No visible opaque urinary tract calculi. Cardiac silhouette normal in size, unchanged. LEFT subclavian dual lead transvenous pacemaker unchanged and intact. Mild atelectasis at the LEFT lung base, new. Lungs otherwise clear. IMPRESSION: 1. Partial small bowel obstruction. No evidence of free intraperitoneal air. 2. Mild atelectasis involving the LEFT LOWER LOBE. No acute cardiopulmonary disease otherwise. Electronically Signed   By: Hulan Saashomas  Lawrence M.D.   On: 03/22/2018 16:14     ASSESSMENT AND PLAN:    1.  Acute enteritiscolitis 2.  New duodenal lesion, possibly ulcer or inflamed diverticulum 3.  Pancreatic lesions, incidental finding per abdominal CAT scan 4.  Diabetes type 2 5.  Hypertension  Discussed with Dr. Earlene Plateravis of surgery.  Continue IV antibiotics at this time.  Waiting for GI input.  May need MRI/EGD.  Likely discharge tomorrow if improving with outpatient work-up.  All the records are reviewed and case discussed with Care Management/Social Worker Management plans discussed with the patient, family and they are in agreement.  CODE STATUS: FULL CODE  DVT Prophylaxis: SCDs  TOTAL TIME TAKING CARE OF THIS PATIENT: 35 minutes.   POSSIBLE D/C IN 1-2 DAYS, DEPENDING ON CLINICAL CONDITION.  Orie FishermanSrikar R Taisia Fantini M.D on 03/23/2018 at 2:38 PM  Between 7am to 6pm - Pager - [phone]  After 6pm go to [email or web address] - password [email or web address] [facility]  [facility] [place] [facility]  Office  [phone]  CC: Primary care physician; [facility] [place]  [facility]  Note: This dictation was prepared  with Dragon dictation along with smaller phrase technology. Any transcriptional errors that result from this process are unintentional.

## 2018-03-24 ENCOUNTER — Encounter: Payer: Self-pay | Admitting: Anesthesiology

## 2018-03-24 ENCOUNTER — Inpatient Hospital Stay: Payer: Medicare Other | Admitting: Anesthesiology

## 2018-03-24 ENCOUNTER — Encounter
Admission: EM | Disposition: A | Discharge status: Home / Self Care | Payer: Self-pay | Source: Home / Self Care | Attending: Internal Medicine

## 2018-03-24 DIAGNOSIS — R935 Abnormal findings on diagnostic imaging of other abdominal regions, including retroperitoneum: Secondary | ICD-10-CM

## 2018-03-24 HISTORY — PX: ESOPHAGOGASTRODUODENOSCOPY (EGD) WITH PROPOFOL: SHX5813

## 2018-03-24 LAB — CBC WITH DIFFERENTIAL/PLATELET
BASOS PCT: 0 %
Basophils Absolute: 0 10*3/uL (ref 0–0.1)
EOS ABS: 0.1 10*3/uL (ref 0–0.7)
EOS PCT: 1 %
HCT: 36.2 % — ABNORMAL LOW (ref 40.0–52.0)
Hemoglobin: 12.5 g/dL — ABNORMAL LOW (ref 13.0–18.0)
LYMPHS ABS: 1.9 10*3/uL (ref 1.0–3.6)
Lymphocytes Relative: 15 %
MCH: 33 pg (ref 26.0–34.0)
MCHC: 34.6 g/dL (ref 32.0–36.0)
MCV: 95.5 fL (ref 80.0–100.0)
MONO ABS: 0.6 10*3/uL (ref 0.2–1.0)
Monocytes Relative: 5 %
Neutro Abs: 10.1 10*3/uL — ABNORMAL HIGH (ref 1.4–6.5)
Neutrophils Relative %: 79 %
PLATELETS: 184 10*3/uL (ref 150–440)
RBC: 3.79 MIL/uL — ABNORMAL LOW (ref 4.40–5.90)
RDW: 14 % (ref 11.5–14.5)
WBC: 12.7 10*3/uL — AB (ref 3.8–10.6)

## 2018-03-24 LAB — HEPATITIS PANEL, ACUTE
HCV Ab: <0.1 s/co ratio (ref 0.0–0.9)
HEP B C IGM: NEGATIVE
Hep A IgM: NEGATIVE
Hepatitis B Surface Ag: NEGATIVE

## 2018-03-24 LAB — BASIC METABOLIC PANEL
Anion gap: 5 (ref 5–15)
BUN: 9 mg/dL (ref 8–23)
CALCIUM: 8.3 mg/dL — AB (ref 8.9–10.3)
CO2: 25 mmol/L (ref 22–32)
CREATININE: 1.04 mg/dL (ref 0.61–1.24)
Chloride: 111 mmol/L (ref 98–111)
GFR calc Af Amer: >60 mL/min (ref >60)
Glucose, Bld: 105 mg/dL — ABNORMAL HIGH (ref 70–99)
Potassium: 3.7 mmol/L (ref 3.5–5.1)
Sodium: 141 mmol/L (ref 135–145)

## 2018-03-24 LAB — GLUCOSE, CAPILLARY
Glucose-Capillary: 99 mg/dL (ref 70–99)
Glucose-Capillary: 109 mg/dL — ABNORMAL HIGH (ref 70–99)
Glucose-Capillary: 228 mg/dL — ABNORMAL HIGH (ref 70–99)
Glucose-Capillary: 92 mg/dL (ref 70–99)

## 2018-03-24 LAB — URINE CULTURE
Culture: NO GROWTH
Special Requests: NORMAL

## 2018-03-24 SURGERY — ESOPHAGOGASTRODUODENOSCOPY (EGD) WITH PROPOFOL
Anesthesia: General

## 2018-03-24 SURGERY — EGD (ESOPHAGOGASTRODUODENOSCOPY)
Anesthesia: General

## 2018-03-24 MED ORDER — ONDANSETRON HCL 4 MG/2ML IJ SOLN: 4.0000 mg | Freq: Once | INTRAMUSCULAR | Status: DC | PRN

## 2018-03-24 MED ORDER — FENTANYL CITRATE (PF) 100 MCG/2ML IJ SOLN: 25.0000 ug | INTRAMUSCULAR | Status: DC | PRN

## 2018-03-24 MED ORDER — PANCRELIPASE (LIP-PROT-AMYL) 12000-38000 UNITS PO CPEP
72000.0000 [IU] | ORAL_CAPSULE | Freq: Three times a day (TID) | ORAL | Status: DC
  Administered 2018-03-24 – 2018-03-25 (×4): 72000 [IU] via ORAL
  Filled 2018-03-24 (×4): qty 6

## 2018-03-24 MED ORDER — PROPOFOL 10 MG/ML IV BOLUS
INTRAVENOUS | Status: DC | PRN
  Administered 2018-03-24: 50 mg via INTRAVENOUS
  Administered 2018-03-24: 20 mg via INTRAVENOUS

## 2018-03-24 NOTE — Anesthesia Preprocedure Evaluation (Addendum)
Anesthesia Evaluation  Patient identified by MRN, date of birth, ID band  Reviewed: Allergy & Precautions, NPO status , Patient's Chart, lab work & pertinent test results  Airway Mallampati: II  TM Distance: >3 FB     Dental   Pulmonary COPD,  COPD inhaler,    Pulmonary exam normal  (-) decreased breath sounds      Cardiovascular hypertension, +CHF  Normal cardiovascular exam+ pacemaker      Neuro/Psych PSYCHIATRIC DISORDERS Bipolar Disorder    GI/Hepatic Neg liver ROS,   Endo/Other  negative endocrine ROSdiabetes  Renal/GU negative Renal ROS  negative genitourinary   Musculoskeletal negative musculoskeletal ROS (+)   Abdominal Normal abdominal exam  (+)  Abdomen: rigid and tender.    Peds negative pediatric ROS (+)  Hematology negative hematology ROS (+)   Anesthesia Other Findings Past Medical History: No date: Bipolar disorder (HCC) No date: BPH (benign prostatic hyperplasia) No date: Cardiomyopathy [facility](HCC)     Comment:  a. 03/2017 Echo: EF 40%, mild diastolic dysfxn. Mild MR. No date: Diabetes mellitus without complication (HCC) No date: Gout No date: History of permanent cardiac pacemaker placement     Comment:  a. s/p PPM. No date: Hyperlipidemia No date: Hypertension  Reproductive/Obstetrics                            Anesthesia Physical  Anesthesia Plan  ASA: III and emergent  Anesthesia Plan: General   Post-op Pain Management:    Induction: Intravenous  PONV Risk Score and Plan: TIVA and Propofol infusion  Airway Management Planned: Nasal Cannula  Additional Equipment: Arterial line  Intra-op Plan:   Post-operative Plan:   Informed Consent: I have reviewed the patients History and Physical, chart, labs and discussed the procedure including the risks, benefits and alternatives for the proposed anesthesia with the patient or authorized representative who has indicated  his/her understanding and acceptance.   Dental advisory given  Plan Discussed with: CRNA and Surgeon  Anesthesia Plan Comments:         Anesthesia Quick Evaluation

## 2018-03-24 NOTE — Progress Notes (Signed)
[facility] - [place] [facility]   PATIENT NAME: Alvin PeckJohn [name]    MR#:  [id number]  DATE OF BIRTH:  11/20/1940  SUBJECTIVE:  CHIEF COMPLAINT:   Chief Complaint  Patient presents with  . Diarrhea   No diarrhea.  Continues to have some cramping abdominal pain.  Afebrile. Stool is forming up now, had endoscopy without significant findings.  REVIEW OF SYSTEMS:    Review of Systems  Constitutional: Positive for malaise/fatigue. Negative for chills and fever.  HENT: Negative for sore throat.   Eyes: Negative for blurred vision, double vision and pain.  Respiratory: Negative for cough, hemoptysis, shortness of breath and wheezing.   Cardiovascular: Negative for chest pain, palpitations, orthopnea and leg swelling.  Gastrointestinal: Positive for abdominal pain. Negative for constipation, diarrhea, heartburn, nausea and vomiting.  Genitourinary: Negative for dysuria and hematuria.  Musculoskeletal: Negative for back pain and joint pain.  Skin: Negative for rash.  Neurological: Negative for sensory change, speech change, focal weakness and headaches.  Endo/Heme/Allergies: Does not bruise/bleed easily.  Psychiatric/Behavioral: Negative for depression. The patient is not nervous/anxious.     DRUG ALLERGIES:  No Known Allergies  VITALS:  Blood pressure 137/74, pulse 65, temperature 97.7 F (36.5 C), temperature source Oral, resp. rate 20, height 5\' 8"  (1.727 m), weight 89.4 kg, SpO2 98 %.  PHYSICAL EXAMINATION:   Physical Exam  GENERAL:  77 y.o.-year-old patient lying in the bed with no acute distress.  EYES: Pupils equal, round, reactive to light and accommodation. No scleral icterus. Extraocular muscles intact.  HEENT: Head atraumatic, normocephalic. Oropharynx and nasopharynx clear.  NECK:  Supple, no jugular venous distention. No thyroid enlargement, no tenderness.  LUNGS: Normal breath sounds bilaterally, no wheezing, rales, rhonchi. No use of accessory  muscles of respiration.  CARDIOVASCULAR: S1, S2 normal. No murmurs, rubs, or gallops.  ABDOMEN: Soft, nontender, nondistended. Bowel sounds present. No organomegaly or mass.  EXTREMITIES: No cyanosis, clubbing or edema b/l.    NEUROLOGIC: Cranial nerves II through XII are intact. No focal Motor or sensory deficits b/l.   PSYCHIATRIC: The patient is alert and awake SKIN: No obvious rash, lesion, or ulcer.   LABORATORY PANEL:   CBC Recent Labs  Lab 03/24/18 0322  WBC 12.7*  HGB 12.5*  HCT 36.2*  PLT 184   ------------------------------------------------------------------------------------------------------------------ Chemistries  Recent Labs  Lab 03/22/18 1203  03/24/18 0322  NA 139   < > 141  K 4.1   < > 3.7  CL 109   < > 111  CO2 23   < > 25  GLUCOSE 172*   < > 105*  BUN 7*   < > 9  CREATININE 1.07   < > 1.04  CALCIUM 8.8*   < > 8.3*  AST 84*  --   --   ALT 67*  --   --   ALKPHOS 145*  --   --   BILITOT 0.9  --   --    < > = values in this interval not displayed.   ------------------------------------------------------------------------------------------------------------------  Cardiac Enzymes No results for input(s): TROPONINI in the last 168 hours. ------------------------------------------------------------------------------------------------------------------  RADIOLOGY:  Ct Abdomen Pelvis W Contrast  Result Date: 03/22/2018 CLINICAL DATA:  77 y/o M; abdominal cramping with unresolved diarrhea for the past week. Recent admission 8/10-8/12 for the same. EXAM: CT ABDOMEN AND PELVIS WITH CONTRAST TECHNIQUE: Multidetector CT imaging of the abdomen and pelvis was performed using the standard protocol following bolus administration of intravenous contrast. CONTRAST:  100mL ISOVUE-300 IOPAMIDOL (ISOVUE-300) INJECTION 61% COMPARISON:  03/17/2018 CT abdomen and pelvis. FINDINGS: Lower chest: Severe coronary artery calcific atherosclerosis. Moderate calcification of the  aortic valve. Two lead pacemaker noted. Hepatobiliary: Stable mild nodularity to the liver contour. Cholelithiasis. No gallbladder wall thickening or biliary ductal dilatation. Pancreas: Stable hypodense lesions within the body and tail of the pancreas measuring 13 and 19 mm (series 5, image 50 and 56). No main duct dilatation. Spleen: Normal in size without focal abnormality. Adrenals/Urinary Tract: Adrenal glands are unremarkable. Kidneys are normal, without renal calculi, focal lesion, or hydronephrosis. Bladder is unremarkable. Stomach/Bowel: New hypodense lesion within the measuring up to 26 mm which appears to be centered within the anterior wall of the third segment of duodenum (series 2, image 46 and series 6, image 60). Mild diffuse distention of small bowel in the mid abdomen without a transition point. Small bowel dilatation is mildly improved compared with the prior CT of abdomen and pelvis. Normal appendix. Normal appearance of the stomach. Vascular/Lymphatic: Aortic atherosclerosis. No enlarged abdominal or pelvic lymph nodes. Reproductive: Moderate prostate enlargement extending into the floor of the bladder. Other: No abdominal wall hernia or abnormality. No abdominopelvic ascites. Musculoskeletal: No acute or significant osseous findings. IMPRESSION: 1. Mild persistent diffuse distention of small bowel in the mid abdomen, with partial interval improvement when compared with the prior CT of the abdomen and pelvis. Findings may represent infectious/inflammatory enteritis or possibly a partial obstruction. 2. Two stable hypodense lesions within the pancreas body and tail measuring up to 19 mm. Further characterization with pancreas protocol CT or MRI is recommended on a nonemergent basis. 3. New 26 mm hypodense lesion that appears centered within anterior wall of the third segment of duodenum, possibly a duodenal ulcer or inflamed diverticulum. 4. Aortic atherosclerosis. 5. Cholelithiasis. 6. Aortic,  aortic valvular, and coronary artery calcific atherosclerosis. Electronically Signed   By: Mitzi HansenLance  Furusawa-Stratton M.D.   On: 03/22/2018 19:35   Dg Abdomen Acute W/chest  Result Date: 03/22/2018 CLINICAL DATA:  2-3 day history of nausea. One-week history of diarrhea. EXAM: DG ABDOMEN ACUTE W/ 1V CHEST COMPARISON:  CT abdomen and pelvis 03/17/2018. Chest x-rays 03/17/2018 and earlier. Abdominal x-ray 03/23/2017. FINDINGS: Multiple dilated loops of small bowel throughout the upper abdomen. Gas and expected stool burden throughout the normal caliber colon. Scattered air-fluid levels on the ERECT view. No evidence of free intraperitoneal air. No visible opaque urinary tract calculi. Cardiac silhouette normal in size, unchanged. LEFT subclavian dual lead transvenous pacemaker unchanged and intact. Mild atelectasis at the LEFT lung base, new. Lungs otherwise clear. IMPRESSION: 1. Partial small bowel obstruction. No evidence of free intraperitoneal air. 2. Mild atelectasis involving the LEFT LOWER LOBE. No acute cardiopulmonary disease otherwise. Electronically Signed   By: Hulan Saashomas  Lawrence M.D.   On: 03/22/2018 16:14    ASSESSMENT AND PLAN:    1.  Acute enteritiscolitis 2.  New duodenal lesion, possibly ulcer or inflamed diverticulum 3.  Pancreatic lesions, incidental finding per abdominal CAT scan 4.  Diabetes type 2 5.  Hypertension  Discussed with Dr. Earlene Plateravis of surgery.  Continue IV antibiotics at this time.   EGD is done it did not show any acute finding as per Dr. Tobi BastosAnna.  Suggest to restart feeding and advised to follow-up in clinic to do MRI. Patient still have some diarrhea but improving, I will start the diet and possible discharge tomorrow if improves.  All the records are reviewed and case discussed with Care Management/Social Worker Management plans  discussed with the patient, family and they are in agreement.  CODE STATUS: FULL CODE  DVT Prophylaxis: SCDs  TOTAL TIME TAKING CARE OF  THIS PATIENT: 35 minutes.   POSSIBLE D/C IN 1-2 DAYS, DEPENDING ON CLINICAL CONDITION.  Altamese Dilling M.D on 03/24/2018 at 2:14 PM  Between 7am to 6pm - Pager - [phone]  After 6pm go to [email or web address] - password [email or web address]  [facility]  Office  [phone]  CC: Primary care physician; [facility]  Note: This dictation was prepared with Dragon dictation along with smaller phrase technology. Any transcriptional errors that result from this process are unintentional.

## 2018-03-24 NOTE — Anesthesia Post-op Follow-up Note (Signed)
Anesthesia QCDR form completed.        

## 2018-03-24 NOTE — Procedures (Signed)
Alvin MoodKiran Annaleah Arata, MD [address], [address], [place], [place], [address] [address], [address], [place], [place], [address] Phone: [phone]  Fax: [phone]  Primary Care Physician:  [facility]   Pre-Procedure History & Physical: HPI:  Alvin PeckJohn Wonder is a 77 y.o. male is here for an endoscopy    Past Medical History:  Diagnosis Date  . Bipolar disorder (HCC)   . BPH (benign prostatic hyperplasia)   . Cardiomyopathy (HCC)    a. 03/2017 Echo: EF 40%, mild diastolic dysfxn. Mild MR.  . Diabetes mellitus without complication (HCC)   . Gout   . 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  . Hyperlipidemia   . Hypertension     Past Surgical History:  Procedure Laterality Date  . LAPAROTOMY N/A 03/01/2017   Procedure: EXPLORATORY LAPAROTOMY;  Surgeon: Ancil Linseyavis, Jason Evan, MD;  Location: [facility] ORS;  Service: General;  Laterality: N/A;    Prior to Admission medications   Medication Sig Start Date End Date Taking? Authorizing Provider  allopurinol (ZYLOPRIM) 100 MG tablet Take 100 mg by mouth daily.   Yes [provider]  aspirin 81 MG chewable tablet Chew 1 tablet (81 mg total) by mouth daily. 03/24/17  Yes Eugenie NorrieBlakeney, Dana G, NP  cholecalciferol (VITAMIN D) 1000 units tablet Take 2,000 Units by mouth daily.   Yes [provider]  fenofibrate (TRICOR) 145 MG tablet Take 145 mg by mouth daily.   Yes [provider]  lisinopril (PRINIVIL,ZESTRIL) 5 MG tablet Take 5 mg by mouth daily.   Yes [provider]  metFORMIN (GLUCOPHAGE) 500 MG tablet Take 500 mg by mouth 2 (two) times daily.   Yes [provider]  Multiple Vitamins-Minerals (MULTIVITAMINS THER. W/MINERALS) TABS tablet Take 1 tablet by mouth daily.   Yes [provider]  OLANZapine (ZYPREXA) 10 MG tablet Take 10 mg by mouth at bedtime.   Yes [provider]  omeprazole (PRILOSEC) 20 MG capsule Take 20 mg by mouth every morning.    Yes [provider]  terazosin (HYTRIN) 5 MG capsule Take 5 mg by mouth at bedtime.   Yes [provider]  thiamine 100 MG tablet Take 1 tablet (100 mg total) by mouth daily. 03/24/17  Yes Eugenie NorrieBlakeney, Dana G, NP  vitamin B-12 1000 MCG tablet Take 1 tablet (1,000 mcg total) by mouth daily. 03/24/17  Yes Eugenie NorrieBlakeney, Dana G, NP  acetaminophen (TYLENOL) 325 MG tablet Take 2 tablets (650 mg total) by mouth every 6 (six) hours as needed for mild pain (or Fever >/= 101). 03/19/18   Gouru, Deanna ArtisAruna, MD  cephALEXin (KEFLEX) 500 MG capsule Take 1 capsule (500 mg total) by mouth 2 (two) times daily for 5 days. 03/22/18 03/27/18  Sharyn CreamerQuale, Mark, MD  divalproex (DEPAKOTE SPRINKLE) 125 MG capsule Take 2 capsules (250 mg total) by mouth every morning. Patient not taking: Reported on 03/17/2018 03/24/17   Eugenie NorrieBlakeney, Dana G, NP  ondansetron (ZOFRAN) 4 MG tablet Take 1 tablet (4 mg total) by mouth every 6 (six) hours as needed for nausea. Patient taking differently: Take 4 mg by mouth every 6 (six) hours as needed.  03/23/17   Eugenie NorrieBlakeney, Dana G, NP  polyethylene glycol (MIRALAX / GLYCOLAX) packet Take 17 g by mouth daily as needed for mild constipation or moderate constipation.    [provider]    Allergies as of 03/22/2018  . (No Known Allergies)    Family History  Problem Relation Age of Onset  . Stroke Mother   . Cancer Father     Social History   Socioeconomic History  . Marital status: Divorced    Spouse name: Not on file  . Number of children: 2  . Years of education: college  . Highest education level: Bachelor's degree (e.g., BA, AB, BS)  Occupational History  . Occupation: retired  Engineer, productionocial Needs  . Financial resource strain: Patient refused  . Food insecurity:    Worry: Patient refused    Inability: Patient refused  . Transportation needs:    Medical: Patient refused    Non-medical: Patient refused  Tobacco Use  . Smoking status: Never Smoker  . Smokeless tobacco: Never Used   Substance and Sexual Activity  . Alcohol use: No  . Drug use: No  . Sexual activity: Never  Lifestyle  . Physical activity:    Days per week: 0 days    Minutes per session: 0 min  . Stress: Not at all  Relationships  . Social connections:    Talks on phone: More than three times a week    Gets together: More than three times a week    Attends religious service: Never    Active member of club or organization: No    Attends meetings of clubs or organizations: Never    Relationship status: Divorced  . Intimate partner violence:    Fear of current or ex partner: Patient refused    Emotionally abused: Patient refused    Physically abused: Patient refused    Forced sexual activity: Patient refused  Other Topics Concern  . Not on file  Social History Narrative   Pt says that he lives in [place] with his sister, though notes indicate that he lives in an assisted living facitliy.  He does not routinely exercise.    Review of Systems: See HPI, otherwise negative ROS  Physical Exam: BP 108/67 (BP Location: Left Arm)   Pulse (!) 59   Temp 98.1 F (36.7 C) (Oral)   Resp 18   Ht 5\' 8"  (1.727 m)   Wt 89.4 kg   SpO2 93%   BMI 29.97 kg/m  General:   Alert,  pleasant and cooperative in NAD Head:  Normocephalic and atraumatic. Neck:  Supple; no masses or thyromegaly. Lungs:  Clear throughout to auscultation, normal respiratory effort.    Heart:  +S1, +S2, Regular rate and rhythm, No edema. Abdomen:  Soft, nontender and nondistended. Normal bowel sounds, without guarding, and without rebound.   Neurologic:  Alert and  oriented x4;  grossly normal neurologically.  Impression/Plan: Alvin [name] is here for an endoscopy  to be performed for  evaluation of abnormal ct scan     Risks, benefits, limitations, and alternatives regarding endoscopy have been reviewed with the patient.  Questions have been answered.  All parties agreeable.   Alvin MoodKiran Gleen Ripberger, MD  03/24/2018, 12:22 PM

## 2018-03-24 NOTE — Transfer of Care (Signed)
Immediate Anesthesia Transfer of Care Note  Patient: Lorenda PeckJohn Druck  Procedure(s) Performed: ESOPHAGOGASTRODUODENOSCOPY (EGD) WITH PROPOFOL (N/A )  Patient Location: PACU  Anesthesia Type:General  Level of Consciousness: awake and alert   Airway & Oxygen Therapy: Patient Spontanous Breathing and Patient connected to face mask oxygen  Post-op Assessment: Report given to RN  Post vital signs: Reviewed and stable  Last Vitals:  Vitals Value Taken Time  BP    Temp    Pulse    Resp    SpO2      Last Pain:  Vitals:   03/24/18 0930  TempSrc:   PainSc: 0-No pain      Patients Stated Pain Goal: 3 (03/23/18 2233)  Complications: No apparent anesthesia complications

## 2018-03-24 NOTE — Op Note (Signed)
[facility] Patient Name: Alvin [name] Procedure Date: 03/24/2018 12:01 PM MRN: [id number] Account #: [id number] Date of Birth: 31-Oct-1940 Admit Type: Inpatient Age: 77 Room: [facility] ENDO ROOM 4 Gender: Male Note Status: Finalized Procedure:            Upper GI endoscopy Indications:          Abnormal CT of the GI tract Providers:            Alvin Mood MD, MD Medicines:            Monitored Anesthesia Care Complications:        No immediate complications. Procedure:            Pre-Anesthesia Assessment:                       - Prior to the procedure, a History and Physical was                        performed, and patient medications, allergies and                        sensitivities were reviewed. The patient's tolerance of                        previous anesthesia was reviewed.                       - The risks and benefits of the procedure and the                        sedation options and risks were discussed with the                        patient. All questions were answered and informed                        consent was obtained.                       - ASA Grade Assessment: III - A patient with severe                        systemic disease.                       After obtaining informed consent, the endoscope was                        passed under direct vision. Throughout the procedure,                        the patient's blood pressure, pulse, and oxygen                        saturations were monitored continuously. The Endoscope                        was introduced through the mouth, and advanced to the                        third part of duodenum. The upper GI endoscopy  was                        accomplished with ease. The patient tolerated the                        procedure well. Findings:      The esophagus was normal.      The stomach was normal.      The cardia and gastric fundus were normal on retroflexion.      The  examined duodenum was normal. Biopsies for histology were taken with       a cold forceps for evaluation of celiac disease. The scope was inserted       all the way to the hilt and reached a fair distance past the proximal       jejunum. No abnormalities noted, no fluid build up      The examined jejunum was normal. Impression:           - Normal esophagus.                       - Normal stomach.                       - Normal examined duodenum. Biopsied.                       - Normal examined jejunum. Recommendation:       - Return patient to hospital ward for ongoing care.                       - 1. Commence on clears                       2. Avoid all artificial sugars in diet                       3. Commence on CREON                       4. Out patient follow up with DR Allegra LaiVanga , can consider                        MRE/MRI enterogram/abdomen to evaluate in a few weeks                        for resolution of findings seen on recent CT scan as                        well as evaluate pancreatic lesions Procedure Code(s):    --- Professional ---                       [phone], Esophagogastroduodenoscopy, flexible, transoral;                        with biopsy, single or multiple Diagnosis Code(s):    --- Professional ---                       R93.3, Abnormal findings on diagnostic imaging of other  parts of digestive tract CPT copyright 2017 American Medical Association. All rights reserved. The codes documented in this report are preliminary and upon coder review may  be revised to meet current compliance requirements. Alvin MoodKiran Carena Stream, MD Alvin MoodKiran Lacharles Altschuler MD, MD 03/24/2018 12:32:58 PM This report has been signed electronically. Number of Addenda: 0 Note Initiated On: 03/24/2018 12:01 PM      [facility]

## 2018-03-24 NOTE — Plan of Care (Signed)

## 2018-03-25 LAB — TISSUE TRANSGLUTAMINASE, IGA: Tissue Transglutaminase Ab, IgA: <2 U/mL (ref 0–3)

## 2018-03-25 LAB — GLUCOSE, CAPILLARY
GLUCOSE-CAPILLARY: 116 mg/dL — AB (ref 70–99)
GLUCOSE-CAPILLARY: 143 mg/dL — AB (ref 70–99)
Glucose-Capillary: 156 mg/dL — ABNORMAL HIGH (ref 70–99)
Glucose-Capillary: 133 mg/dL — ABNORMAL HIGH (ref 70–99)

## 2018-03-25 NOTE — Progress Notes (Signed)
Patient ID: Alvin PeckJohn [name], male   DOB: 11/05/1940, 77 y.o.   MRN: [id number]     SURGICAL PROGRESS NOTE   Hospital Day(s): 2.   Post op day(s): 1 Day Post-Op.   Interval History: Patient seen and examined, no acute events or new complaints overnight. Patient found eating soft meat with carrots and ice cream. Patient reports having 3 episodes of soft stools. Denies nausea or vomiting.   Vital signs in last 24 hours: [min-max] current  Temp:  [97.9 F (36.6 C)-98.6 F (37 C)] 98.2 F (36.8 C) (08/18 1535) Pulse Rate:  [63-70] 70 (08/18 1535) Resp:  [18-22] 22 (08/18 1535) BP: (119-127)/(72-78) 121/78 (08/18 1535) SpO2:  [95 %-98 %] 95 % (08/18 1535) Weight:  [89.3 kg] 89.3 kg (08/18 0500)     Height: 5\' 8"  (172.7 cm) Weight: 89.3 kg BMI (Calculated): 29.94    Physical Exam:  Constitutional: alert, cooperative and no distress  Respiratory: breathing non-labored at rest  Cardiovascular: regular rate and sinus rhythm  Gastrointestinal: soft, non-tender, and non-distended  Labs:  CBC Latest Ref Rng & Units 03/24/2018 03/23/2018 03/22/2018  WBC 3.8 - 10.6 K/uL 12.7(H) 13.8(H) 15.3(H)  Hemoglobin 13.0 - 18.0 g/dL 12.5(L) 12.2(L) 13.6  Hematocrit 40.0 - 52.0 % 36.2(L) 35.7(L) 39.9(L)  Platelets 150 - 440 K/uL 184 168 203   CMP Latest Ref Rng & Units 03/24/2018 03/23/2018 03/22/2018  Glucose 70 - 99 mg/dL 409(W105(H) 119(J140(H) 478(G172(H)  BUN 8 - 23 mg/dL 9 6(L) 7(L)  Creatinine 0.61 - 1.24 mg/dL 9.561.04 2.130.94 0.861.07  Sodium 135 - 145 mmol/L 141 139 139  Potassium 3.5 - 5.1 mmol/L 3.7 3.6 4.1  Chloride 98 - 111 mmol/L 111 108 109  CO2 22 - 32 mmol/L 25 27 23   Calcium 8.9 - 10.3 mg/dL 8.3(L) 8.3(L) 8.8(L)  Total Protein 6.5 - 8.1 g/dL - - 6.0(L)  Total Bilirubin 0.3 - 1.2 mg/dL - - 0.9  Alkaline Phos 38 - 126 U/L - - 145(H)  AST 15 - 41 U/L - - 84(H)  ALT 0 - 44 U/L - - 67(H)    Imaging studies: Endoscopy images and report personally evaluated.   Assessment/Plan:  77 y.o. 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 Patient today found eating soft diet without problem. Three bowel movements today. Agree with current management. Recommend to workup pancreatic lesion as a possible cause of his diarrhea. This may be done as outpatient by PCP or Gastroenterologist. No surgical management indicated at his moment.   Gae GallopEdgardo Cintrn-Daz, MD

## 2018-03-25 NOTE — Progress Notes (Signed)
[facility] - [place] [facility]   PATIENT NAME: Alvin PeckJohn [name]    MR#:  [id number]  DATE OF BIRTH:  04/28/1941  SUBJECTIVE:  CHIEF COMPLAINT:   Chief Complaint  Patient presents with  . Diarrhea   No diarrhea.  Continues to have some cramping abdominal pain.  Afebrile. Stool is forming up now, had endoscopy without significant findings.  remains in bed, diarrhea is slightly better.   REVIEW OF SYSTEMS:    Review of Systems  Constitutional: Positive for malaise/fatigue. Negative for chills and fever.  HENT: Negative for sore throat.   Eyes: Negative for blurred vision, double vision and pain.  Respiratory: Negative for cough, hemoptysis, shortness of breath and wheezing.   Cardiovascular: Negative for chest pain, palpitations, orthopnea and leg swelling.  Gastrointestinal: Positive for abdominal pain. Negative for constipation, diarrhea, heartburn, nausea and vomiting.  Genitourinary: Negative for dysuria and hematuria.  Musculoskeletal: Negative for back pain and joint pain.  Skin: Negative for rash.  Neurological: Negative for sensory change, speech change, focal weakness and headaches.  Endo/Heme/Allergies: Does not bruise/bleed easily.  Psychiatric/Behavioral: Negative for depression. The patient is not nervous/anxious.     DRUG ALLERGIES:  No Known Allergies  VITALS:  Blood pressure 121/78, pulse 70, temperature 98.2 F (36.8 C), temperature source Oral, resp. rate (!) 22, height 5\' 8"  (1.727 m), weight 89.3 kg, SpO2 95 %.  PHYSICAL EXAMINATION:   Physical Exam  GENERAL:  77 y.o.-year-old patient lying in the bed with no acute distress.  EYES: Pupils equal, round, reactive to light and accommodation. No scleral icterus. Extraocular muscles intact.  HEENT: Head atraumatic, normocephalic. Oropharynx and nasopharynx clear.  NECK:  Supple, no jugular venous distention. No thyroid enlargement, no tenderness.  LUNGS: Normal breath sounds bilaterally, no  wheezing, rales, rhonchi. No use of accessory muscles of respiration.  CARDIOVASCULAR: S1, S2 normal. No murmurs, rubs, or gallops.  ABDOMEN: Soft, nontender, nondistended. Bowel sounds present. No organomegaly or mass.  EXTREMITIES: No cyanosis, clubbing or edema b/l.    NEUROLOGIC: Cranial nerves II through XII are intact. No focal Motor or sensory deficits b/l.  Have generalized weakness. PSYCHIATRIC: The patient is alert and awake. SKIN: No obvious rash, lesion, or ulcer.   LABORATORY PANEL:   CBC Recent Labs  Lab 03/24/18 0322  WBC 12.7*  HGB 12.5*  HCT 36.2*  PLT 184   ------------------------------------------------------------------------------------------------------------------ Chemistries  Recent Labs  Lab 03/22/18 1203  03/24/18 0322  NA 139   < > 141  K 4.1   < > 3.7  CL 109   < > 111  CO2 23   < > 25  GLUCOSE 172*   < > 105*  BUN 7*   < > 9  CREATININE 1.07   < > 1.04  CALCIUM 8.8*   < > 8.3*  AST 84*  --   --   ALT 67*  --   --   ALKPHOS 145*  --   --   BILITOT 0.9  --   --    < > = values in this interval not displayed.   ------------------------------------------------------------------------------------------------------------------  Cardiac Enzymes No results for input(s): TROPONINI in the last 168 hours. ------------------------------------------------------------------------------------------------------------------  RADIOLOGY:  No results found.  ASSESSMENT AND PLAN:    1.  Acute enteritiscolitis 2.  New duodenal lesion, possibly ulcer or inflamed diverticulum 3.  Pancreatic lesions, incidental finding per abdominal CAT scan 4.  Diabetes type 2 5.  Hypertension  Discussed with Dr. Earlene Plateravis of  surgery.  Continue IV antibiotics at this time.   EGD is done it did not show any acute finding as per Dr. Tobi BastosAnna.  Suggest to restart feeding and advised to follow-up in clinic to do MRI. Patient still have some diarrhea but improving,started on diet,  encouraged to walk in room.  All the records are reviewed and case discussed with Care Management/Social Worker Management plans discussed with the patient, family and they are in agreement.  CODE STATUS: FULL CODE  DVT Prophylaxis: SCDs  TOTAL TIME TAKING CARE OF THIS PATIENT: 35 minutes.   POSSIBLE D/C IN 1-2 DAYS, DEPENDING ON CLINICAL CONDITION.  Altamese DillingVaibhavkumar Kenan Moodie M.D on 03/25/2018 at 5:16 PM  Between 7am to 6pm - Pager - [phone]  After 6pm go to [email or web address] - password [email or web address]  [facility]  Office  [phone]  CC: Primary care physician; [facility]  Note: This dictation was prepared with Dragon dictation along with smaller phrase technology. Any transcriptional errors that result from this process are unintentional.

## 2018-03-25 NOTE — Plan of Care (Signed)
Pt denies abdominal pain/nor tenderness. Denies n/v. Tolerates soft diet. 2xsmears of stools during the shift. Up to the chair with standby assist.

## 2018-03-26 DIAGNOSIS — K862 Cyst of pancreas: Secondary | ICD-10-CM

## 2018-03-26 LAB — GLUCOSE, CAPILLARY
Glucose-Capillary: 94 mg/dL (ref 70–99)
Glucose-Capillary: 118 mg/dL — ABNORMAL HIGH (ref 70–99)
Glucose-Capillary: 202 mg/dL — ABNORMAL HIGH (ref 70–99)
Glucose-Capillary: 138 mg/dL — ABNORMAL HIGH (ref 70–99)

## 2018-03-26 LAB — CALPROTECTIN, FECAL: Calprotectin, Fecal: 180 ug/g — ABNORMAL HIGH (ref 0–120)

## 2018-03-26 MED ORDER — METRONIDAZOLE 250 MG PO TABS: 250.0000 mg | ORAL_TABLET | Freq: Three times a day (TID) | ORAL | 0 refills | Status: AC

## 2018-03-26 MED ORDER — PANCRELIPASE (LIP-PROT-AMYL) 36000-114000 UNITS PO CPEP: 72000.0000 [IU] | ORAL_CAPSULE | Freq: Three times a day (TID) | ORAL | 0 refills | Status: AC

## 2018-03-26 MED ORDER — CIPROFLOXACIN HCL 250 MG PO TABS: 250.0000 mg | ORAL_TABLET | Freq: Two times a day (BID) | ORAL | 0 refills | Status: AC

## 2018-03-26 NOTE — Discharge Summary (Signed)
[facility] - [place] at [facility]   PATIENT NAME: Alvin PeckJohn [name]    MR#:  [id number]  DATE OF BIRTH:  04/11/1941  DATE OF ADMISSION:  03/22/2018 ADMITTING PHYSICIAN: Cammy CopaAngela Maier, MD  DATE OF DISCHARGE: 03/26/2018  PRIMARY CARE PHYSICIAN: [facility]    ADMISSION DIAGNOSIS:  Enteritis [K52.9] Sepsis, due to unspecified organism (HCC) [A41.9] High urine leukocyte count [R82.998] Leukocytosis, unspecified type [D72.829]  DISCHARGE DIAGNOSIS:  Active Problems:   Acute colitis   Colitis   SECONDARY DIAGNOSIS:   Past Medical History:  Diagnosis Date  . Bipolar disorder (HCC)   . BPH (benign prostatic hyperplasia)   . Cardiomyopathy (HCC)    a. 03/2017 Echo: EF 40%, mild diastolic dysfxn. Mild MR.  . Diabetes mellitus without complication (HCC)   . Gout   . 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  . Hyperlipidemia   . Hypertension     HOSPITAL COURSE:    1.Acute enteritiscolitis 2.New duodenal lesion,possibly ulcer or inflamed diverticulum 3.Pancreatic lesions, incidental finding per abdominal CAT scan 4.Diabetes type 2 5.Hypertension  Discussed with Dr. Earlene Plateravis of surgery.  Continue IV antibiotics at this time.   EGD is done it did not show any acute finding as per Dr. Tobi BastosAnna.  Suggest to restart feeding and advised to follow-up in clinic to do MRI. Spoke with pt's daughter about this also. His diarrhea improved and he is able to ambulate.  DISCHARGE CONDITIONS:   Stable.  CONSULTS OBTAINED:  Treatment Team:  Ancil Linseyavis, Jason Evan, MD Toney ReilVanga, Rohini Reddy, MD Wyline MoodAnna, Kiran, MD  DRUG ALLERGIES:  No Known Allergies  DISCHARGE MEDICATIONS:   Allergies as of 03/26/2018   No Known Allergies     Medication List    STOP taking these medications   divalproex 125 MG capsule Commonly known as:  DEPAKOTE SPRINKLE     TAKE these medications   acetaminophen 325 MG tablet Commonly known as:   TYLENOL Take 2 tablets (650 mg total) by mouth every 6 (six) hours as needed for mild pain (or Fever >/= 101).   allopurinol 100 MG tablet Commonly known as:  ZYLOPRIM Take 100 mg by mouth daily.   aspirin 81 MG chewable tablet Chew 1 tablet (81 mg total) by mouth daily.   cholecalciferol 1000 units tablet Commonly known as:  VITAMIN D Take 2,000 Units by mouth daily.   ciprofloxacin 250 MG tablet Commonly known as:  CIPRO Take 1 tablet (250 mg total) by mouth 2 (two) times daily for 3 days.   cyanocobalamin 1000 MCG tablet Take 1 tablet (1,000 mcg total) by mouth daily.   fenofibrate 145 MG tablet Commonly known as:  TRICOR Take 145 mg by mouth daily.   lipase/protease/amylase [id number] UNITS Cpep capsule Commonly known as:  CREON Take 2 capsules (72,000 Units total) by mouth 3 (three) times daily with meals.   lisinopril 5 MG tablet Commonly known as:  PRINIVIL,ZESTRIL Take 5 mg by mouth daily.   metFORMIN 500 MG tablet Commonly known as:  GLUCOPHAGE Take 500 mg by mouth 2 (two) times daily.   metroNIDAZOLE 250 MG tablet Commonly known as:  FLAGYL Take 1 tablet (250 mg total) by mouth 3 (three) times daily for 4 days.   multivitamins ther. w/minerals Tabs tablet Take 1 tablet by mouth daily.   OLANZapine 10 MG tablet Commonly known as:  ZYPREXA Take 10 mg by mouth at bedtime.   omeprazole 20 MG capsule Commonly known  as:  PRILOSEC Take 20 mg by mouth every morning.   ondansetron 4 MG tablet Commonly known as:  ZOFRAN Take 1 tablet (4 mg total) by mouth every 6 (six) hours as needed for nausea. What changed:  reasons to take this   polyethylene glycol packet Commonly known as:  MIRALAX / GLYCOLAX Take 17 g by mouth daily as needed for mild constipation or moderate constipation.   terazosin 5 MG capsule Commonly known as:  HYTRIN Take 5 mg by mouth at bedtime.   thiamine 100 MG tablet Take 1 tablet (100 mg total) by mouth daily.        DISCHARGE  INSTRUCTIONS:    Follow with GI clinic in 1-2 weeks.  If you experience worsening of your admission symptoms, develop shortness of breath, life threatening emergency, suicidal or homicidal thoughts you must seek medical attention immediately by calling 911 or calling your MD immediately  if symptoms less severe.  You Must read complete instructions/literature along with all the possible adverse reactions/side effects for all the Medicines you take and that have been prescribed to you. Take any new Medicines after you have completely understood and accept all the possible adverse reactions/side effects.   Please note  You were cared for by a hospitalist during your hospital stay. If you have any questions about your discharge medications or the care you received while you were in the hospital after you are discharged, you can call the unit and asked to speak with the hospitalist on call if the hospitalist that took care of you is not available. Once you are discharged, your primary care physician will handle any further medical issues. Please note that NO REFILLS for any discharge medications will be authorized once you are discharged, as it is imperative that you return to your primary care physician (or establish a relationship with a primary care physician if you do not have one) for your aftercare needs so that they can reassess your need for medications and monitor your lab values.    Today   CHIEF COMPLAINT:   Chief Complaint  Patient presents with  . Diarrhea    HISTORY OF PRESENT ILLNESS:  Alvin [name]  is a 77 y.o. male with a known history of bipolar disorder, BPH, CHF, diabetes type 2, hyperlipidemia, hypertension. Patient was transferred from nursing home facility for diarrhea and fever.  Patient has had diarrhea and poor appetite for a week.  He was actually seen in the hospital again just few days ago for the same reason and he seemed to improve with IV fluids.  His diarrhea  seemed to improve as well.  However, per nursing home records in the past 2 days patient has been having more frequent diarrhea and earlier today he had fever noted at the facility.  Occasional abdominal cramps are associated. Blood test done emergency room are notable for elevated WBC of 15.3.  Glucose level is 172.  Creatinine level is 1.07.  UA shows leukocytosis.  Stool test, including C. difficile and gastrointestinal panel were -4 days ago. Abdominal CT scan shows partial SBO, with new 26 mm hypodense lesion that appears centered within anterior wall of the third segment of duodenum, possibly a duodenal ulcer or inflamed diverticulum.  2 stable pancreatic lesions are noted as well. Patient is admitted for further evaluation and treatment.   VITAL SIGNS:  Blood pressure (!) 148/80, pulse 66, temperature 97.6 F (36.4 C), temperature source Oral, resp. rate 15, height 5\' 8"  (1.727 m), weight  86.5 kg, SpO2 96 %.  I/O:    Intake/Output Summary (Last 24 hours) at 03/26/2018 1632 Last data filed at 03/26/2018 1500 Gross per 24 hour  Intake -  Output 1200 ml  Net -1200 ml    PHYSICAL EXAMINATION:  GENERAL:  [age] y.o.-year-old patient lying in the bed with no acute distress.  EYES: Pupils equal, round, reactive to light and accommodation. No scleral icterus. Extraocular muscles intact.  HEENT: Head atraumatic, normocephalic. Oropharynx and nasopharynx clear.  NECK:  Supple, no jugular venous distention. No thyroid enlargement, no tenderness.  LUNGS: Normal breath sounds bilaterally, no wheezing, rales,rhonchi or crepitation. No use of accessory muscles of respiration.  CARDIOVASCULAR: S1, S2 normal. No murmurs, rubs, or gallops.  ABDOMEN: Soft, non-tender, non-distended. Bowel sounds present. No organomegaly or mass.  EXTREMITIES: No pedal edema, cyanosis, or clubbing.  NEUROLOGIC: Cranial nerves II through XII are intact. Muscle strength 5/5 in all extremities. Sensation intact. Gait not  checked.  PSYCHIATRIC: The patient is alert and oriented x 3.  SKIN: No obvious rash, lesion, or ulcer.   DATA REVIEW:   CBC Recent Labs  Lab 03/24/18 0322  WBC 12.7*  HGB 12.5*  HCT 36.2*  PLT 184    Chemistries  Recent Labs  Lab 03/22/18 1203  03/24/18 0322  NA 139   < > 141  K 4.1   < > 3.7  CL 109   < > 111  CO2 23   < > 25  GLUCOSE 172*   < > 105*  BUN 7*   < > 9  CREATININE 1.07   < > 1.04  CALCIUM 8.8*   < > 8.3*  AST 84*  --   --   ALT 67*  --   --   ALKPHOS 145*  --   --   BILITOT 0.9  --   --    < > = values in this interval not displayed.    Cardiac Enzymes No results for input(s): TROPONINI in the last 168 hours.  Microbiology Results  Results for orders placed or performed during the hospital encounter of 03/22/18  Urine Culture     Status: None   Collection Time: 03/22/18  3:46 PM  Result Value Ref Range Status   Specimen Description   Final    URINE, CLEAN CATCH Performed at [facility], [address] [place], [place] [address]    Special Requests   Final    Normal Performed at [facility], [address] [place], [place] [address]    Culture   Final    NO GROWTH Performed at [facility], [address] [place], [place] [address]    Report Status 03/24/2018 FINAL  Final  Blood Culture (routine x 2)     Status: None (Preliminary result)   Collection Time: 03/22/18 10:44 PM  Result Value Ref Range Status   Specimen Description BLOOD LEFT HAND  Final   Special Requests   Final    BOTTLES DRAWN AEROBIC AND ANAEROBIC Blood Culture adequate volume   Culture   Final    NO GROWTH 4 DAYS Performed at [facility], [address] [place], [place] [address]    Report Status PENDING  Incomplete  Blood Culture (routine x 2)     Status: None (Preliminary result)   Collection Time: 03/22/18 10:44 PM  Result Value Ref Range Status   Specimen Description BLOOD RIGHT FATTY CASTS  Final   Special Requests  Final    BOTTLES DRAWN AEROBIC AND ANAEROBIC Blood Culture adequate volume   Culture   Final    NO GROWTH 4 DAYS Performed at [facility], [address] [place], [place] [address]    Report Status PENDING  Incomplete    RADIOLOGY:  No results found.  EKG:   Orders placed or performed during the hospital encounter of 03/17/18  . EKG 12-Lead  . EKG 12-Lead  . ED EKG  . ED EKG  . EKG      Management plans discussed with the patient, family and they are in agreement.  CODE STATUS: Full.    Code Status Orders  (From admission, onward)         Start     Ordered   03/23/18 0206  Full code  Continuous     03/23/18 0206        Code Status History    Date Active Date Inactive Code Status Order ID Comments User Context   03/17/2018 1634 03/19/2018 1807 Full Code [id number]  Houston Siren, MD Inpatient   03/23/2017 0441 03/24/2017 0103 DNR [id number]  Ihor Austin, MD Inpatient   03/02/2017 1052 03/17/2017 2106 DNR [id number]  Shane Crutch, MD Inpatient   03/01/2017 0200 03/02/2017 1052 Full Code [id number]  Hugelmeyer, Jon Gills, DO Inpatient      TOTAL TIME TAKING CARE OF THIS PATIENT: 35 minutes.    Altamese Dilling M.D on 03/26/2018 at 4:32 PM  Between 7am to 6pm - Pager - [phone]  After 6pm go to [email or web address] - password [email or web address]  [facility] [place] [facility]  Office  [phone]  CC: Primary care physician; [facility]   Note: This dictation was prepared with Dragon dictation along with smaller phrase technology. Any transcriptional errors that result from this process are unintentional.

## 2018-03-26 NOTE — Care Management Important Message (Signed)
Important Message  Patient Details  Name: Alvin PeckJohn [name] MRN: [id number] Date of Birth: 04/26/1941   Medicare Important Message Given:  Yes    Olegario MessierKathy A Gussie Towson 03/26/2018, 11:56 AM

## 2018-03-26 NOTE — Clinical Social Work Note (Signed)
Patient is medically ready for discharge today back to [facility] [address]. CSW notified patient and daughter/Guardian Legrand ComoBobbette Benegas [phone]. Daughter will transport patient back to facility. RN to call report.   Ruthe Mannanandace Avi Kerschner MSW, [address] [phone]

## 2018-03-26 NOTE — Care Management (Signed)
[facility] made aware patient is discharging.

## 2018-03-27 ENCOUNTER — Encounter: Payer: Self-pay | Admitting: Gastroenterology

## 2018-03-27 LAB — CULTURE, BLOOD (ROUTINE X 2)
Culture: NO GROWTH
Culture: NO GROWTH
SPECIAL REQUESTS: ADEQUATE
SPECIAL REQUESTS: ADEQUATE

## 2018-03-27 LAB — PANCREATIC ELASTASE, FECAL: PANCREATIC ELASTASE-1, STL: 195 ug Elast./g — AB (ref >200)

## 2018-03-28 LAB — SURGICAL PATHOLOGY

## 2018-03-29 NOTE — Anesthesia Postprocedure Evaluation (Signed)
Anesthesia Post Note  Patient: Alvin PeckJohn [name]  Procedure(s) Performed: ESOPHAGOGASTRODUODENOSCOPY (EGD) WITH PROPOFOL (N/A )  Patient location during evaluation: PACU Anesthesia Type: General Level of consciousness: awake and alert and oriented Pain management: pain level controlled Vital Signs Assessment: post-procedure vital signs reviewed and stable Respiratory status: spontaneous breathing Cardiovascular status: blood pressure returned to baseline Anesthetic complications: no     Last Vitals:  Vitals:   03/25/18 2016 03/26/18 0350  BP: 134/77 (!) 148/80  Pulse: 66 66  Resp: 15 15  Temp: 37.1 C 36.4 C  SpO2: 95% 96%    Last Pain:  Vitals:   03/26/18 1000  TempSrc:   PainSc: 0-No pain                 Mccoy Testa

## 2018-04-10 DIAGNOSIS — R197 Diarrhea, unspecified: Secondary | ICD-10-CM

## 2018-04-10 DIAGNOSIS — K862 Cyst of pancreas: Secondary | ICD-10-CM

## 2018-05-04 ENCOUNTER — Ambulatory Visit: Payer: TRICARE For Life (TFL) | Admitting: Gastroenterology

## 2019-09-10 ENCOUNTER — Emergency Department (HOSPITAL_COMMUNITY): Payer: No Typology Code available for payment source

## 2019-09-10 ENCOUNTER — Other Ambulatory Visit: Payer: Self-pay

## 2019-09-10 ENCOUNTER — Emergency Department (HOSPITAL_COMMUNITY)
Admission: EM | Admit: 2019-09-10 | Discharge: 2019-09-10 | Disposition: A | Discharge status: Home / Self Care | Payer: No Typology Code available for payment source | Attending: Emergency Medicine | Admitting: Emergency Medicine

## 2019-09-10 DIAGNOSIS — E119 Type 2 diabetes mellitus without complications: Secondary | ICD-10-CM | POA: Diagnosis not present

## 2019-09-10 DIAGNOSIS — S022XXA Fracture of nasal bones, initial encounter for closed fracture: Secondary | ICD-10-CM

## 2019-09-10 DIAGNOSIS — S0081XA Abrasion of other part of head, initial encounter: Secondary | ICD-10-CM | POA: Insufficient documentation

## 2019-09-10 DIAGNOSIS — Z7984 Long term (current) use of oral hypoglycemic drugs: Secondary | ICD-10-CM | POA: Diagnosis not present

## 2019-09-10 DIAGNOSIS — Z79899 Other long term (current) drug therapy: Secondary | ICD-10-CM | POA: Diagnosis not present

## 2019-09-10 DIAGNOSIS — I10 Essential (primary) hypertension: Secondary | ICD-10-CM | POA: Diagnosis not present

## 2019-09-10 DIAGNOSIS — Y929 Unspecified place or not applicable: Secondary | ICD-10-CM | POA: Insufficient documentation

## 2019-09-10 DIAGNOSIS — S0990XA Unspecified injury of head, initial encounter: Secondary | ICD-10-CM | POA: Diagnosis present

## 2019-09-10 DIAGNOSIS — S80212A Abrasion, left knee, initial encounter: Secondary | ICD-10-CM | POA: Diagnosis not present

## 2019-09-10 DIAGNOSIS — W109XXA Fall (on) (from) unspecified stairs and steps, initial encounter: Secondary | ICD-10-CM | POA: Insufficient documentation

## 2019-09-10 DIAGNOSIS — S60511A Abrasion of right hand, initial encounter: Secondary | ICD-10-CM | POA: Diagnosis not present

## 2019-09-10 DIAGNOSIS — Y999 Unspecified external cause status: Secondary | ICD-10-CM | POA: Diagnosis not present

## 2019-09-10 DIAGNOSIS — Z7982 Long term (current) use of aspirin: Secondary | ICD-10-CM | POA: Diagnosis not present

## 2019-09-10 DIAGNOSIS — Y939 Activity, unspecified: Secondary | ICD-10-CM | POA: Insufficient documentation

## 2019-09-10 DIAGNOSIS — W19XXXA Unspecified fall, initial encounter: Secondary | ICD-10-CM

## 2019-09-10 NOTE — ED Provider Notes (Signed)
[facility] Provider Note   CSN: [id number] Arrival date & time: 09/10/19  1451     History No chief complaint on file.   Alvin [name] is a 79 y.o. male.  HPI Patient presents after a fall.  Reportedly tripped going up some steps and fell forward striking his head.  Planing of pain in his right hand and mild headache.  Reportedly was a little confused after the fall.  No neck pain.  No anticoagulation.  Also mild pain in left knee.  Has chronically dilated right pupil.  States his last tetanus was recent.    Past Medical History:  Diagnosis Date  . Bipolar disorder ([place])   . BPH (benign prostatic hyperplasia)   . Cardiomyopathy ([place])    a. 03/2017 Echo: EF 40%, mild diastolic dysfxn. Mild MR.  . Diabetes mellitus without complication ([place])   . Gout   . 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  . Hyperlipidemia   . Hypertension     Patient Active Problem List   Diagnosis Date Noted  . Diarrhea   . Pancreas cyst   . Colitis 03/23/2018  . Acute colitis 03/22/2018  . Gastroenteritis 03/17/2018  . Sepsis ([place]) 03/23/2017  . Subacute delirium 03/07/2017  . Bipolar disorder ([place]) 03/07/2017  . 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 Acute respiratory failure with hypoxemia ([place])   . Abdominal distention   . Lactic acidosis   . Elevated lipase   . Severe sepsis with septic shock ([place]) 02/28/2017    Past Surgical History:  Procedure Laterality Date  . ESOPHAGOGASTRODUODENOSCOPY (EGD) WITH PROPOFOL N/A 03/24/2018   Procedure: ESOPHAGOGASTRODUODENOSCOPY (EGD) WITH PROPOFOL;  Surgeon: Lin Landsman, MD;  Location: [facility];  Service: Gastroenterology;  Laterality: N/A;  . LAPAROTOMY N/A 03/01/2017   Procedure: EXPLORATORY LAPAROTOMY;  Surgeon: Vickie Epley, MD;  Location: [facility];  Service: General;  Laterality: N/A;       Family History  Problem Relation Age of Onset  . Stroke Mother     . Cancer Father     Social History   Tobacco Use  . Smoking status: Never Smoker  . Smokeless tobacco: Never Used  Substance Use Topics  . Alcohol use: No  . Drug use: No    Home Medications Prior to Admission medications   Medication Sig Start Date End Date Taking? Authorizing Provider  acetaminophen (TYLENOL) 325 MG tablet Take 2 tablets (650 mg total) by mouth every 6 (six) hours as needed for mild pain (or Fever >/= 101). 03/19/18   Nicholes Mango, MD  allopurinol (ZYLOPRIM) 100 MG tablet Take 100 mg by mouth daily.    [provider]  aspirin 81 MG chewable tablet Chew 1 tablet (81 mg total) by mouth daily. 03/24/17   Awilda Bill, NP  cholecalciferol (VITAMIN D) 1000 units tablet Take 2,000 Units by mouth daily.    [provider]  fenofibrate (TRICOR) 145 MG tablet Take 145 mg by mouth daily.    [provider]  lipase/protease/amylase (CREON) 36000 UNITS CPEP capsule Take 2 capsules (72,000 Units total) by mouth 3 (three) times daily with meals. 03/26/18   Vaughan Basta, MD  lisinopril (PRINIVIL,ZESTRIL) 5 MG tablet Take 5 mg by mouth daily.    [provider]  metFORMIN (GLUCOPHAGE) 500 MG tablet Take 500 mg by mouth 2 (two) times daily.    [provider]  Multiple Vitamins-Minerals (MULTIVITAMINS THER. W/MINERALS) TABS tablet  Take 1 tablet by mouth daily.    [provider]  OLANZapine (ZYPREXA) 10 MG tablet Take 10 mg by mouth at bedtime.    [provider]  omeprazole (PRILOSEC) 20 MG capsule Take 20 mg by mouth every morning.    [provider]  ondansetron (ZOFRAN) 4 MG tablet Take 1 tablet (4 mg total) by mouth every 6 (six) hours as needed for nausea. Patient taking differently: Take 4 mg by mouth every 6 (six) hours as needed.  03/23/17   Eugenie Norrie, NP  polyethylene glycol (MIRALAX / GLYCOLAX) packet Take 17 g by mouth daily as needed for mild constipation or moderate constipation.     [provider]  terazosin (HYTRIN) 5 MG capsule Take 5 mg by mouth at bedtime.    [provider]  thiamine 100 MG tablet Take 1 tablet (100 mg total) by mouth daily. 03/24/17   Eugenie Norrie, NP  vitamin B-12 1000 MCG tablet Take 1 tablet (1,000 mcg total) by mouth daily. 03/24/17   Eugenie Norrie, NP    Allergies    Patient has no known allergies.  Review of Systems   Review of Systems  Constitutional: Negative for appetite change and fever.  HENT: Negative for congestion.   Respiratory: Negative for shortness of breath.   Cardiovascular: Negative for chest pain.  Gastrointestinal: Negative for abdominal distention.  Musculoskeletal: Negative for back pain and neck pain.       Right hand pain.  Neurological: Negative for weakness.  Psychiatric/Behavioral: Positive for confusion.    Physical Exam Updated Vital Signs BP 103/76 (BP Location: Left Arm)   Pulse 90   Temp 97.8 F (36.6 C) (Oral)   Resp 19   SpO2 97%   Physical Exam Vitals and nursing note reviewed.  HENT:     Head:     Comments: Abrasion to forehead and shallow laceration to bridge of nose and between eyes.    Nose:     Comments: Dried blood in nose without septal hematoma Eyes:     Extraocular Movements: Extraocular movements intact.     Comments: Right pupil chronically dilated  Musculoskeletal:        General: Normal range of motion.     Comments: Mild tenderness with abrasion over right hand.  No deformity.  Abrasion over left knee without underlying bony tenderness and good range of motion.  Skin:    General: Skin is warm.     Capillary Refill: Capillary refill takes less than 2 seconds.  Neurological:     Mental Status: He is alert and oriented to person, place, and time.     ED Results / Procedures / Treatments   Labs (all labs ordered are listed, but only abnormal results are displayed) Labs Reviewed - No data to display  EKG None  Radiology No results  found.  Procedures Procedures (including critical care time)  Medications Ordered in ED Medications - No data to display  ED Course  I have reviewed the triage vital signs and the nursing notes.  Pertinent labs & imaging results that were available during my care of the patient were reviewed by me and considered in my medical decision making (see chart for details).    MDM Rules/Calculators/A&P                      Patient with fall.  Mechanical.  Abrasion to head and nose.  Will get x-ray of hand and  CT of head and face.  Care turned over to Dr. Madilyn Hook. Final Clinical Impression(s) / ED Diagnoses Final diagnoses:  Fall, initial encounter  Minor head injury, initial encounter  Abrasion of face, initial encounter    Rx / DC Orders ED Discharge Orders    None       Benjiman Core, MD 09/10/19 1546

## 2019-09-10 NOTE — ED Provider Notes (Signed)
Patient care assumed at 1600. Patient here for evaluation following a trip and fall. He has abrasions to his face, right hand. CT head is significant for nasal fracture, no additional injuries. Discussed with patient home care for abrasions, nasal fracture following a fall. Discussed outpatient follow-up and return precautions. Findings of studies also discussed with patient's daughter, Kennen Stammer.  He is a resident at [place] [facility] [place].    Tilden Fossa, MD 09/10/19 [phone]

## 2019-09-10 NOTE — ED Notes (Signed)
Courtney from [employer] given report on the patient. [facility] transported the patient out of the ED. Pt conscious, breathing, and A&Ox4. No distress noted.

## 2019-09-10 NOTE — ED Notes (Signed)
ED Provider at bedside. 

## 2019-09-10 NOTE — ED Triage Notes (Signed)
Pt here via ems. Pt tripped and fell mechanical fall hit his nose, abrasion on nose and forehead. Pt not really talking to ems and ems noticed Pts right pupil is dilated. Once on the truck pt started talking to EMS and was a&ox4 and states that he injured his right pupil over a year ago. EMS states not reactive to light. Aspirin 81mg . cbg 214. 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

## 2020-04-15 IMAGING — CT CT MAXILLOFACIAL W/O CM
3 series · 16 of 47 positions shown, 19 images · non-contrast
Comparison: March 01, 2017

CLINICAL DATA: Patient tripped and fell in his nose

EXAM:
CT HEAD WITHOUT CONTRAST
TECHNIQUE: Contiguous axial images were obtained from the base of the skull
through the vertex without intravenous contrast.

[Series 3: facial/ orbits 2.0 h30s · axial · 0.36mm/px · z∈[-159,+9]mm · 10 of 98 slices shown, 13 images]
[im 7/98  brain]
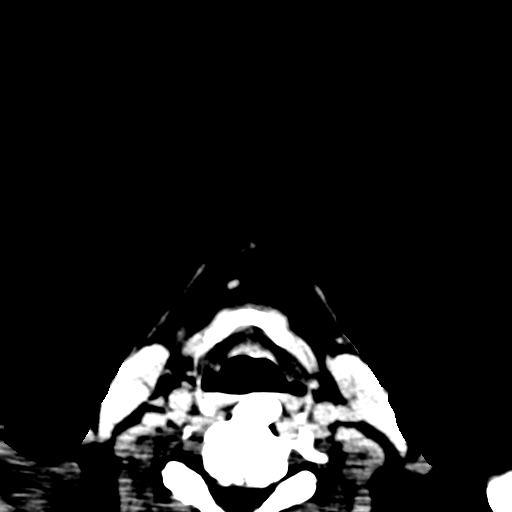
[im 7/98  bone]
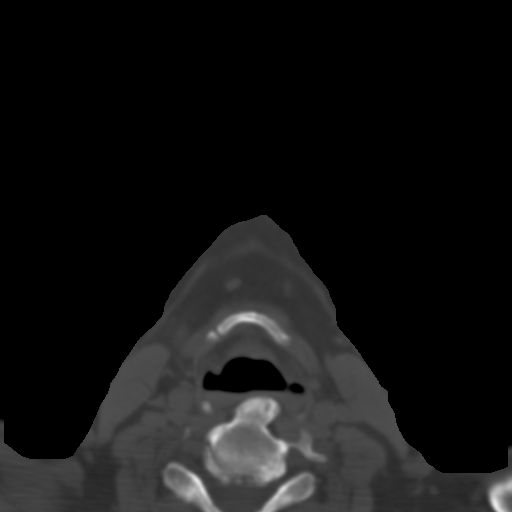
[im 17/98  bone]
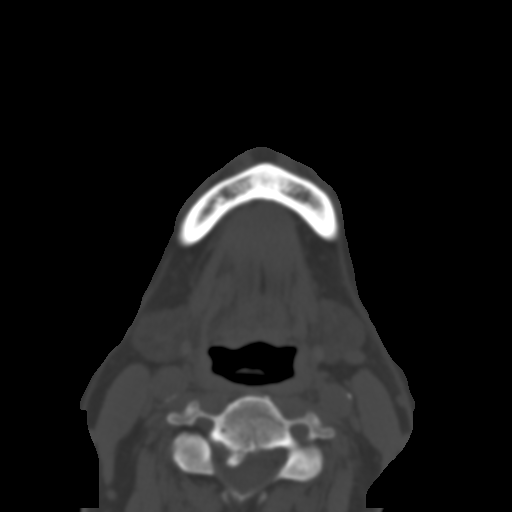
[im 27/98  bone]
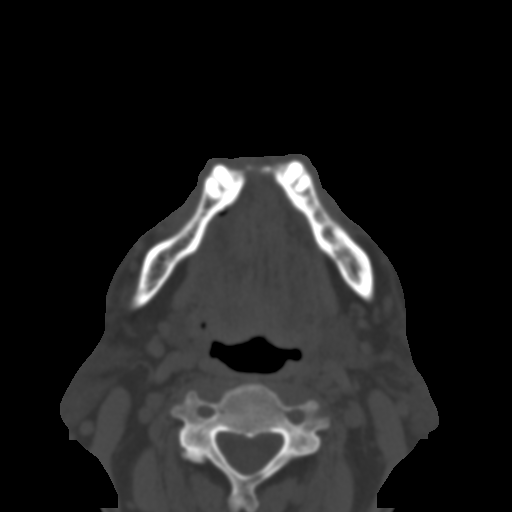
[im 34/98  bone]
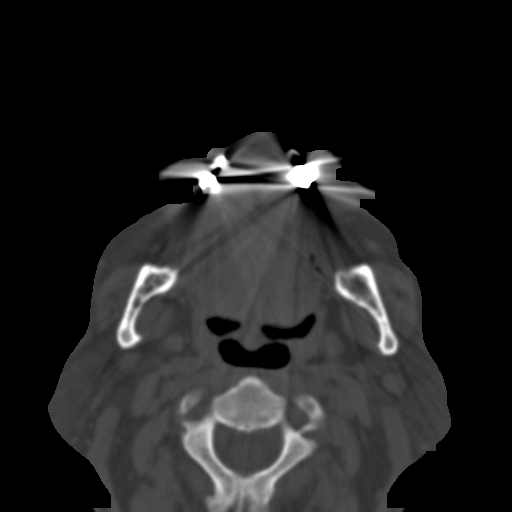
[im 44/98  brain]
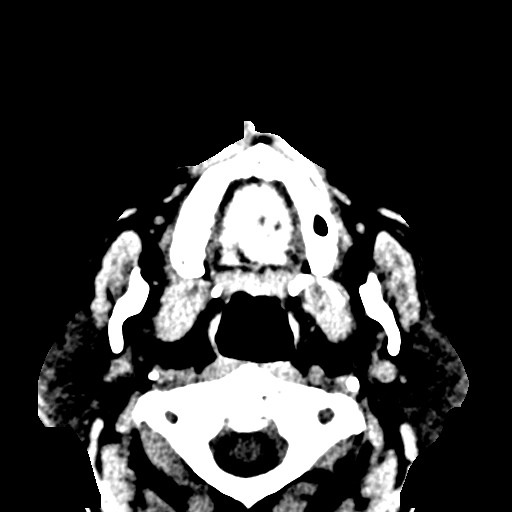
[im 44/98  bone]
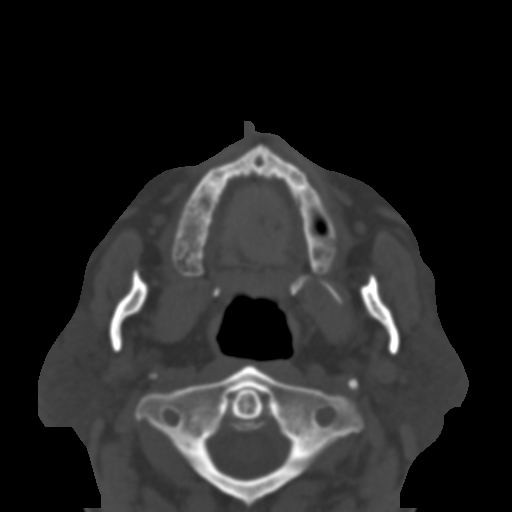
[im 54/98  bone]
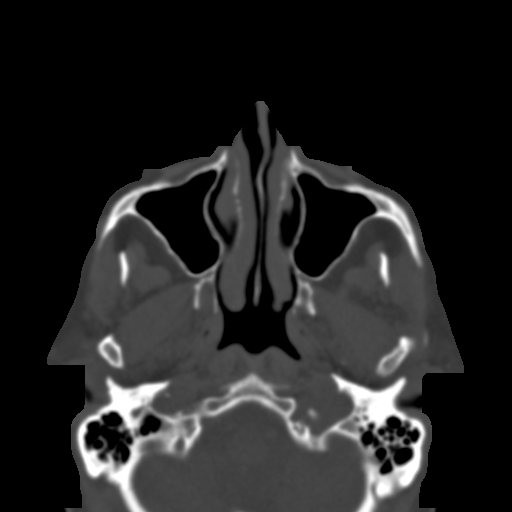
[im 64/98  bone]
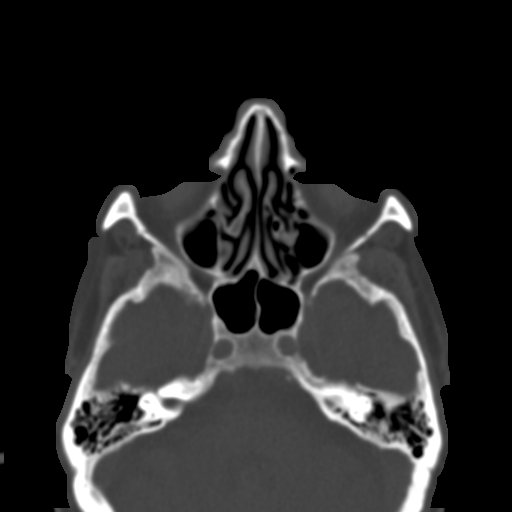
[im 74/98  bone]
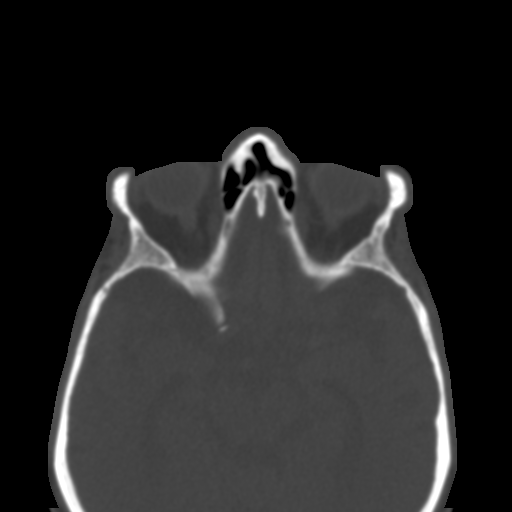
[im 81/98  brain]
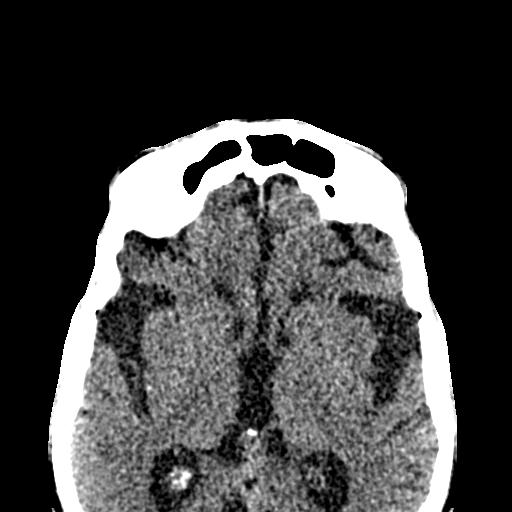
[im 81/98  bone]
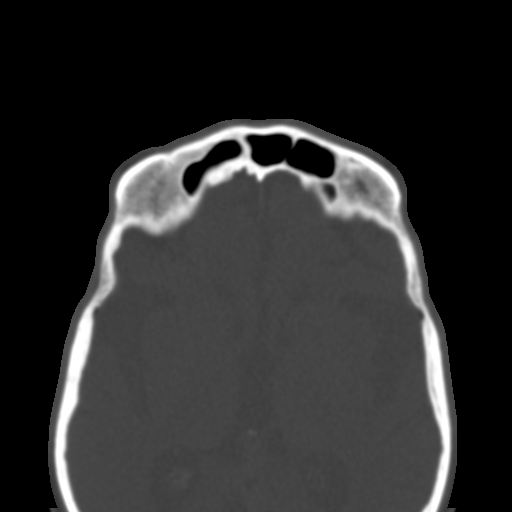
[im 91/98  bone]
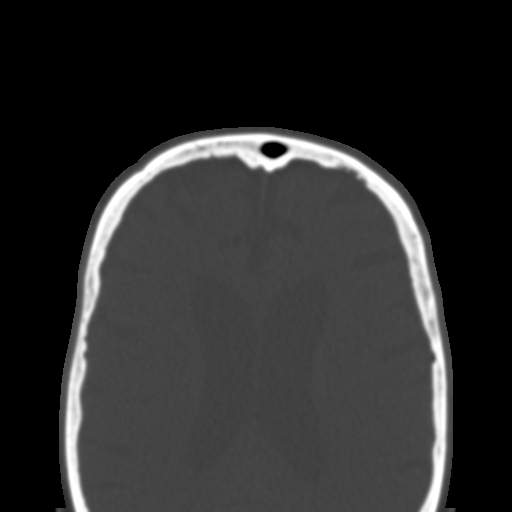

[Series 7: coronal soft tissue · coronal · 0.38mm/px · 3 of 96 slices shown]
[im 32/96  bone]
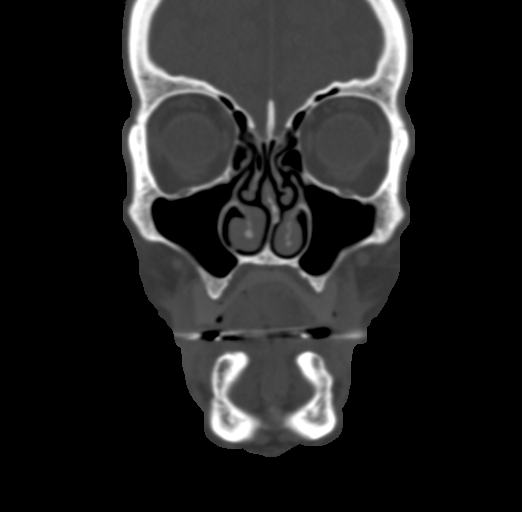
[im 43/96  bone]
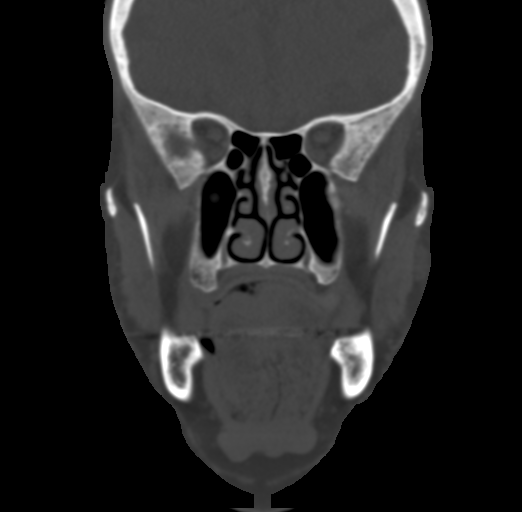
[im 53/96  bone]
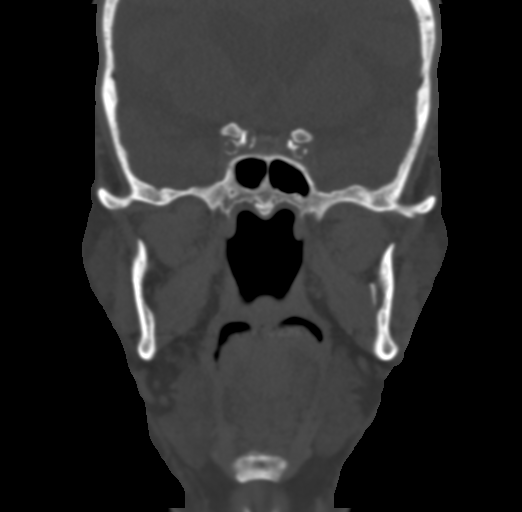

[Series 8: sagittal soft tissue · sagittal · 0.38mm/px · 3 of 112 slices shown]
[im 38/112  bone]
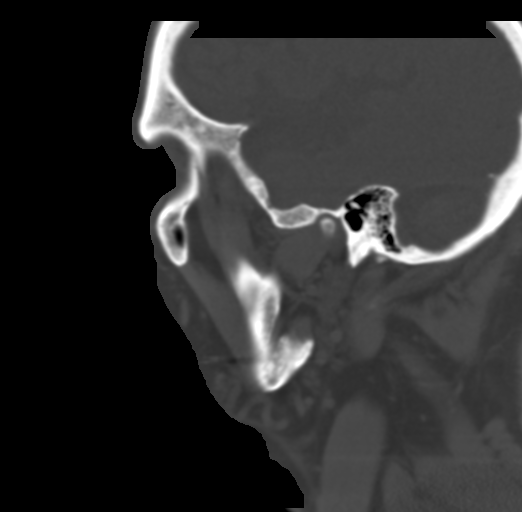
[im 56/112  bone]
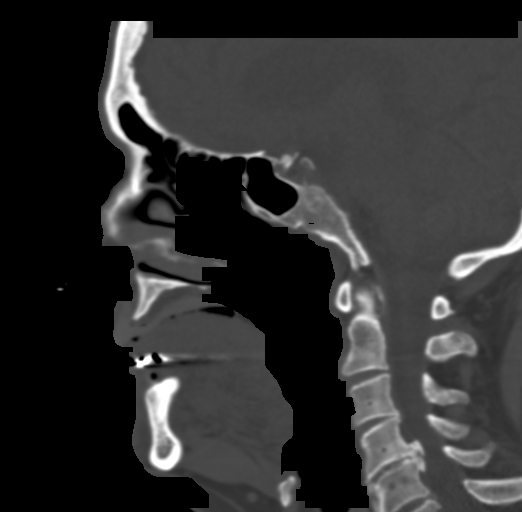
[im 75/112  bone]
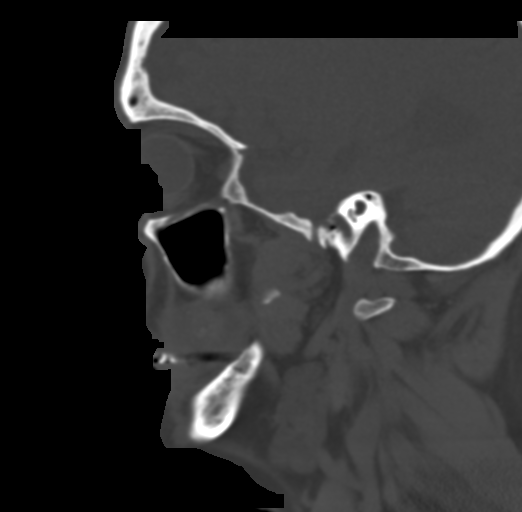

[16 of 47 positions shown; findings below may reference images not displayed]

FINDINGS: Brain: No evidence of acute territorial infarction, hemorrhage,
hydrocephalus,extra-axial collection or mass lesion/mass effect.
There is dilatation the ventricles and sulci consistent with
age-related atrophy. Low-attenuation changes in the deep white
matter consistent with small vessel ischemia.

Vascular: No hyperdense vessel or unexpected calcification.

Skull: The skull is intact. No fracture or focal lesion identified.

Sinuses/Orbits: The visualized paranasal sinuses and mastoid air
cells are clear. The orbits and globes intact.

Other: None

Face:

Osseous: Slightly impacted nondisplaced fracture seen of the
anterior nasal bone, series 4, image 63. Mild overlying soft tissue
swelling. No other facial fractures are seen.

Orbits: No fracture identified. Unremarkable appearance of globes
and orbits.

Sinuses: A small mucous retention cyst seen within the right
maxillary sinus. Remainder of the paranasal sinuses well aerated.
Mastoid air cells are unremarkable.

Soft tissues:  No acute findings.

Limited intracranial: No acute findings.
IMPRESSION: No acute intracranial abnormality.

Findings consistent with age related atrophy and chronic small
vessel ischemia

Slightly impacted mild nondisplaced fracture of the anterior nasal
with overlying soft tissue swelling.

## 2023-02-06 DEATH — deceased
